# Patient Record
Sex: Female | Born: 1935 | Race: White | Hispanic: No | Marital: Married | Smoking: Former smoker
Health system: Southern US, Community
[De-identification: ages and names within clinical notes are randomized; demographics above are authoritative.]

## PROBLEM LIST (undated history)

## (undated) DIAGNOSIS — J449 Chronic obstructive pulmonary disease, unspecified: Secondary | ICD-10-CM

## (undated) DIAGNOSIS — I82409 Acute embolism and thrombosis of unspecified deep veins of unspecified lower extremity: Secondary | ICD-10-CM

## (undated) DIAGNOSIS — J939 Pneumothorax, unspecified: Secondary | ICD-10-CM

## (undated) DIAGNOSIS — I10 Essential (primary) hypertension: Secondary | ICD-10-CM

## (undated) HISTORY — PX: ABDOMINAL HYSTERECTOMY: SHX81

## (undated) HISTORY — PX: TIBIA FRACTURE SURGERY: SHX806

## (undated) HISTORY — PX: HEMIARTHROPLASTY SHOULDER FRACTURE: SUR653

## (undated) HISTORY — PX: OTHER SURGICAL HISTORY: SHX169

---

## 2005-05-28 ENCOUNTER — Ambulatory Visit: Payer: Self-pay | Admitting: Internal Medicine

## 2005-06-06 ENCOUNTER — Ambulatory Visit: Payer: Self-pay | Admitting: Internal Medicine

## 2005-07-07 ENCOUNTER — Ambulatory Visit: Payer: Self-pay | Admitting: Internal Medicine

## 2005-08-07 ENCOUNTER — Ambulatory Visit: Payer: Self-pay | Admitting: Internal Medicine

## 2005-08-14 ENCOUNTER — Ambulatory Visit: Payer: Self-pay | Admitting: Gastroenterology

## 2005-08-22 ENCOUNTER — Ambulatory Visit: Payer: Self-pay | Admitting: Gastroenterology

## 2005-08-28 ENCOUNTER — Other Ambulatory Visit: Payer: Self-pay

## 2005-09-01 ENCOUNTER — Inpatient Hospital Stay: Payer: Self-pay | Admitting: Surgery

## 2005-09-09 ENCOUNTER — Ambulatory Visit: Payer: Self-pay | Admitting: Internal Medicine

## 2005-10-15 ENCOUNTER — Ambulatory Visit: Payer: Self-pay | Admitting: Internal Medicine

## 2005-10-23 ENCOUNTER — Ambulatory Visit: Payer: Self-pay | Admitting: Surgery

## 2005-11-04 ENCOUNTER — Ambulatory Visit: Payer: Self-pay | Admitting: Internal Medicine

## 2005-12-05 ENCOUNTER — Ambulatory Visit: Payer: Self-pay | Admitting: Internal Medicine

## 2006-01-04 ENCOUNTER — Ambulatory Visit: Payer: Self-pay | Admitting: Internal Medicine

## 2006-02-04 ENCOUNTER — Ambulatory Visit: Payer: Self-pay | Admitting: Internal Medicine

## 2006-03-07 ENCOUNTER — Ambulatory Visit: Payer: Self-pay | Admitting: Internal Medicine

## 2006-04-06 ENCOUNTER — Ambulatory Visit: Payer: Self-pay | Admitting: Internal Medicine

## 2006-05-07 ENCOUNTER — Ambulatory Visit: Payer: Self-pay | Admitting: Internal Medicine

## 2006-05-21 ENCOUNTER — Ambulatory Visit: Payer: Self-pay | Admitting: Unknown Physician Specialty

## 2006-06-03 ENCOUNTER — Ambulatory Visit: Payer: Self-pay | Admitting: Unknown Physician Specialty

## 2006-06-06 ENCOUNTER — Ambulatory Visit: Payer: Self-pay | Admitting: Internal Medicine

## 2006-07-07 ENCOUNTER — Ambulatory Visit: Payer: Self-pay | Admitting: Internal Medicine

## 2006-08-20 ENCOUNTER — Ambulatory Visit: Payer: Self-pay | Admitting: Internal Medicine

## 2006-09-05 ENCOUNTER — Ambulatory Visit: Payer: Self-pay | Admitting: Internal Medicine

## 2006-10-06 ENCOUNTER — Ambulatory Visit: Payer: Self-pay | Admitting: Internal Medicine

## 2006-10-12 ENCOUNTER — Ambulatory Visit: Payer: Self-pay | Admitting: Internal Medicine

## 2006-10-13 ENCOUNTER — Ambulatory Visit: Payer: Self-pay | Admitting: Gastroenterology

## 2006-11-05 ENCOUNTER — Ambulatory Visit: Payer: Self-pay | Admitting: Internal Medicine

## 2006-12-06 ENCOUNTER — Ambulatory Visit: Payer: Self-pay | Admitting: Internal Medicine

## 2007-01-05 ENCOUNTER — Ambulatory Visit: Payer: Self-pay | Admitting: Internal Medicine

## 2007-02-05 ENCOUNTER — Ambulatory Visit: Payer: Self-pay | Admitting: Internal Medicine

## 2007-03-08 ENCOUNTER — Ambulatory Visit: Payer: Self-pay | Admitting: Internal Medicine

## 2007-04-07 ENCOUNTER — Ambulatory Visit: Payer: Self-pay | Admitting: Internal Medicine

## 2007-05-11 ENCOUNTER — Ambulatory Visit: Payer: Self-pay | Admitting: Internal Medicine

## 2007-06-07 ENCOUNTER — Ambulatory Visit: Payer: Self-pay | Admitting: Internal Medicine

## 2007-07-08 ENCOUNTER — Ambulatory Visit: Payer: Self-pay | Admitting: Internal Medicine

## 2007-09-05 ENCOUNTER — Ambulatory Visit: Payer: Self-pay | Admitting: Internal Medicine

## 2007-10-29 ENCOUNTER — Ambulatory Visit: Payer: Self-pay | Admitting: Gastroenterology

## 2007-12-06 ENCOUNTER — Ambulatory Visit: Payer: Self-pay | Admitting: Internal Medicine

## 2007-12-16 ENCOUNTER — Ambulatory Visit: Payer: Self-pay | Admitting: Internal Medicine

## 2008-01-05 ENCOUNTER — Ambulatory Visit: Payer: Self-pay | Admitting: Internal Medicine

## 2008-01-12 ENCOUNTER — Ambulatory Visit: Payer: Self-pay | Admitting: Ophthalmology

## 2008-05-07 ENCOUNTER — Ambulatory Visit: Payer: Self-pay | Admitting: Internal Medicine

## 2008-05-24 ENCOUNTER — Ambulatory Visit: Payer: Self-pay | Admitting: Internal Medicine

## 2008-06-06 ENCOUNTER — Ambulatory Visit: Payer: Self-pay | Admitting: Internal Medicine

## 2008-11-04 ENCOUNTER — Ambulatory Visit: Payer: Self-pay | Admitting: Internal Medicine

## 2008-11-09 ENCOUNTER — Ambulatory Visit: Payer: Self-pay | Admitting: Internal Medicine

## 2008-12-05 ENCOUNTER — Ambulatory Visit: Payer: Self-pay | Admitting: Internal Medicine

## 2009-04-06 ENCOUNTER — Ambulatory Visit: Payer: Self-pay | Admitting: Internal Medicine

## 2009-04-26 ENCOUNTER — Ambulatory Visit: Payer: Self-pay | Admitting: Internal Medicine

## 2009-05-07 ENCOUNTER — Ambulatory Visit: Payer: Self-pay | Admitting: Internal Medicine

## 2009-10-11 ENCOUNTER — Ambulatory Visit: Payer: Self-pay | Admitting: Internal Medicine

## 2009-11-01 ENCOUNTER — Ambulatory Visit: Payer: Self-pay | Admitting: Gastroenterology

## 2009-11-04 ENCOUNTER — Ambulatory Visit: Payer: Self-pay | Admitting: Internal Medicine

## 2009-12-09 ENCOUNTER — Inpatient Hospital Stay: Payer: Self-pay | Admitting: Unknown Physician Specialty

## 2009-12-18 ENCOUNTER — Encounter: Payer: Self-pay | Admitting: Internal Medicine

## 2010-01-04 ENCOUNTER — Encounter: Payer: Self-pay | Admitting: Internal Medicine

## 2010-02-04 ENCOUNTER — Encounter: Payer: Self-pay | Admitting: Internal Medicine

## 2010-02-06 ENCOUNTER — Ambulatory Visit: Payer: Self-pay | Admitting: Internal Medicine

## 2010-02-19 ENCOUNTER — Ambulatory Visit: Payer: Self-pay | Admitting: Internal Medicine

## 2010-02-27 ENCOUNTER — Ambulatory Visit: Payer: Self-pay

## 2010-03-05 ENCOUNTER — Ambulatory Visit: Payer: Self-pay

## 2010-03-13 ENCOUNTER — Ambulatory Visit: Payer: Self-pay

## 2010-03-15 ENCOUNTER — Ambulatory Visit: Payer: Self-pay

## 2010-03-19 ENCOUNTER — Ambulatory Visit: Payer: Self-pay | Admitting: Internal Medicine

## 2010-03-22 ENCOUNTER — Ambulatory Visit: Payer: Self-pay

## 2010-03-26 ENCOUNTER — Ambulatory Visit: Payer: Self-pay

## 2010-03-29 ENCOUNTER — Ambulatory Visit: Payer: Self-pay

## 2010-04-02 ENCOUNTER — Ambulatory Visit: Payer: Self-pay

## 2010-04-05 ENCOUNTER — Ambulatory Visit: Payer: Self-pay

## 2010-04-09 ENCOUNTER — Ambulatory Visit: Payer: Self-pay

## 2010-04-12 ENCOUNTER — Ambulatory Visit: Payer: Self-pay

## 2010-04-15 ENCOUNTER — Ambulatory Visit: Payer: Self-pay

## 2010-04-19 ENCOUNTER — Ambulatory Visit: Payer: Self-pay

## 2010-04-23 ENCOUNTER — Ambulatory Visit: Payer: Self-pay

## 2010-04-25 ENCOUNTER — Ambulatory Visit: Payer: Self-pay | Admitting: Internal Medicine

## 2010-04-26 ENCOUNTER — Ambulatory Visit: Payer: Self-pay

## 2010-04-27 LAB — CEA: CEA: 1.2 ng/mL (ref 0.0–4.7)

## 2010-04-30 ENCOUNTER — Ambulatory Visit: Payer: Self-pay

## 2010-05-03 ENCOUNTER — Ambulatory Visit: Payer: Self-pay

## 2010-05-07 ENCOUNTER — Ambulatory Visit: Payer: Self-pay | Admitting: Internal Medicine

## 2010-05-07 ENCOUNTER — Ambulatory Visit: Payer: Self-pay

## 2010-05-28 ENCOUNTER — Ambulatory Visit: Payer: Self-pay

## 2010-06-06 ENCOUNTER — Ambulatory Visit: Payer: Self-pay

## 2012-01-13 ENCOUNTER — Ambulatory Visit: Payer: Self-pay | Admitting: Gastroenterology

## 2012-03-26 ENCOUNTER — Ambulatory Visit: Payer: Self-pay | Admitting: Unknown Physician Specialty

## 2012-04-09 ENCOUNTER — Emergency Department: Payer: Self-pay | Admitting: Unknown Physician Specialty

## 2012-04-09 LAB — CBC
HCT: 29 % — ABNORMAL LOW (ref 35.0–47.0)
MCH: 33.1 pg (ref 26.0–34.0)
MCHC: 36.2 g/dL — ABNORMAL HIGH (ref 32.0–36.0)
Platelet: 300 10*3/uL (ref 150–440)
RDW: 12.4 % (ref 11.5–14.5)
WBC: 10.8 10*3/uL (ref 3.6–11.0)

## 2012-04-09 LAB — BASIC METABOLIC PANEL
Co2: 28 mmol/L (ref 21–32)
Creatinine: 1.24 mg/dL (ref 0.60–1.30)
EGFR (Non-African Amer.): 41 — ABNORMAL LOW
Glucose: 155 mg/dL — ABNORMAL HIGH (ref 65–99)
Osmolality: 280 (ref 275–301)
Potassium: 3.8 mmol/L (ref 3.5–5.1)
Sodium: 137 mmol/L (ref 136–145)

## 2012-04-09 LAB — PROTIME-INR
INR: 1.2
Prothrombin Time: 15.2 secs — ABNORMAL HIGH (ref 11.5–14.7)

## 2012-04-11 ENCOUNTER — Ambulatory Visit: Payer: Self-pay | Admitting: Internal Medicine

## 2012-04-11 ENCOUNTER — Observation Stay: Payer: Self-pay | Admitting: Internal Medicine

## 2012-04-11 LAB — CBC
HCT: 29.7 % — ABNORMAL LOW (ref 35.0–47.0)
HGB: 10.1 g/dL — ABNORMAL LOW (ref 12.0–16.0)
MCH: 31 pg (ref 26.0–34.0)
MCHC: 34.1 g/dL (ref 32.0–36.0)
MCV: 91 fL (ref 80–100)
Platelet: 292 10*3/uL (ref 150–440)
RBC: 3.27 10*6/uL — ABNORMAL LOW (ref 3.80–5.20)
WBC: 10.9 10*3/uL (ref 3.6–11.0)

## 2012-04-11 LAB — COMPREHENSIVE METABOLIC PANEL
Albumin: 2.9 g/dL — ABNORMAL LOW (ref 3.4–5.0)
Alkaline Phosphatase: 82 U/L (ref 50–136)
BUN: 21 mg/dL — ABNORMAL HIGH (ref 7–18)
Bilirubin,Total: 1.1 mg/dL — ABNORMAL HIGH (ref 0.2–1.0)
Chloride: 99 mmol/L (ref 98–107)
Creatinine: 1.33 mg/dL — ABNORMAL HIGH (ref 0.60–1.30)
Glucose: 122 mg/dL — ABNORMAL HIGH (ref 65–99)
SGOT(AST): 25 U/L (ref 15–37)
SGPT (ALT): 12 U/L (ref 12–78)
Sodium: 136 mmol/L (ref 136–145)
Total Protein: 7.2 g/dL (ref 6.4–8.2)

## 2012-04-11 LAB — APTT: Activated PTT: 33.7 secs (ref 23.6–35.9)

## 2012-04-11 LAB — CK TOTAL AND CKMB (NOT AT ARMC): CK-MB: 0.9 ng/mL (ref 0.5–3.6)

## 2012-04-11 LAB — PRO B NATRIURETIC PEPTIDE: B-Type Natriuretic Peptide: 100 pg/mL (ref 0–450)

## 2012-04-11 LAB — PROTIME-INR: Prothrombin Time: 15.5 secs — ABNORMAL HIGH (ref 11.5–14.7)

## 2012-04-11 LAB — TROPONIN I
Troponin-I: <0.02 ng/mL
Troponin-I: <0.02 ng/mL

## 2012-04-12 ENCOUNTER — Encounter: Payer: Self-pay | Admitting: Internal Medicine

## 2012-04-12 LAB — BASIC METABOLIC PANEL
Anion Gap: 12 (ref 7–16)
Calcium, Total: 8 mg/dL — ABNORMAL LOW (ref 8.5–10.1)
Co2: 24 mmol/L (ref 21–32)
Creatinine: 1.37 mg/dL — ABNORMAL HIGH (ref 0.60–1.30)
EGFR (African American): 42 — ABNORMAL LOW
Osmolality: 276 (ref 275–301)
Potassium: 4.4 mmol/L (ref 3.5–5.1)
Sodium: 135 mmol/L — ABNORMAL LOW (ref 136–145)

## 2012-04-12 LAB — CBC WITH DIFFERENTIAL/PLATELET
Basophil #: 0.1 10*3/uL (ref 0.0–0.1)
Basophil %: 0.6 %
Eosinophil #: 0.5 10*3/uL (ref 0.0–0.7)
HCT: 27 % — ABNORMAL LOW (ref 35.0–47.0)
HGB: 9.5 g/dL — ABNORMAL LOW (ref 12.0–16.0)
Lymphocyte %: 14.9 %
MCHC: 35 g/dL (ref 32.0–36.0)
MCV: 91 fL (ref 80–100)
Monocyte #: 1 x10 3/mm — ABNORMAL HIGH (ref 0.2–0.9)
Monocyte %: 10.4 %
Neutrophil #: 6.5 10*3/uL (ref 1.4–6.5)
Neutrophil %: 69 %
RBC: 2.97 10*6/uL — ABNORMAL LOW (ref 3.80–5.20)
WBC: 9.4 10*3/uL (ref 3.6–11.0)

## 2012-04-12 LAB — TROPONIN I: Troponin-I: <0.02 ng/mL

## 2012-04-14 LAB — PROTIME-INR: Prothrombin Time: 18.5 secs — ABNORMAL HIGH (ref 11.5–14.7)

## 2012-04-19 LAB — PROTIME-INR
INR: 1.5
Prothrombin Time: 18.7 secs — ABNORMAL HIGH (ref 11.5–14.7)

## 2012-04-22 LAB — PROTIME-INR
INR: 1.9
Prothrombin Time: 21.7 secs — ABNORMAL HIGH (ref 11.5–14.7)

## 2012-04-26 LAB — PROTIME-INR: INR: 2.1

## 2012-04-29 LAB — PROTIME-INR: INR: 2.2

## 2012-05-04 LAB — PROTIME-INR: INR: 2.4

## 2012-05-07 ENCOUNTER — Encounter: Payer: Self-pay | Admitting: Internal Medicine

## 2013-11-04 DIAGNOSIS — I10 Essential (primary) hypertension: Secondary | ICD-10-CM | POA: Insufficient documentation

## 2014-08-16 DIAGNOSIS — E782 Mixed hyperlipidemia: Secondary | ICD-10-CM | POA: Insufficient documentation

## 2014-10-24 NOTE — H&P (Signed)
PATIENT NAME:  [name], [name] MR#:  [id number] DATE OF BIRTH:  [birth year]  DATE OF ADMISSION:  04/11/2012  PRIMARY CARE PHYSICIAN: Dr. Beckey DowningWillett, [facility].   HISTORY OF PRESENT ILLNESS: This is an 81 year old female who has been having a few falls recently, [place]17 [place] and then had a shoulder fracture and last Tuesday had a left shoulder replacement. The other day she had a fall while she was walking to the bathroom where she tried to walk by herself and got a hematoma on the forehead. The patient has a history of deep vein thrombosis and PE in the past and has been on chronic anticoagulation but has been on hold recently with the surgery and the falls. The patient has had some left lower extremity pain, had some x-ray that  were unremarkable. She had an ultrasound of the left lower extremity that was positive for deep vein thrombosis. Hospitalist services were contacted for evaluation because she was hypoxic. Pulse oximetry of 89% on room air. The patient does complain of some chest pain when she coughs and some shortness of breath going on for a few weeks. In the Emergency Room her INR was subtherapeutic at 1.2.   PAST MEDICAL HISTORY:  1. Deep vein thrombosis and PE.  2. Cerebrovascular accident. 3. Hyperlipidemia. 4. Low vitamin D. 5. History of colon cancer, status post surgery and chemotherapy. 6. Chronic kidney disease.   PAST SURGICAL HISTORY:  1. Left shoulder replacement.  2. Left knee surgery. 3. Colon resection. 4. Hysterectomy.   ALLERGIES: Aspirin and morphine.   MEDICATIONS:  1. Coumadin 4 mg on Monday and Friday, 2.5 mg Tuesday, Wednesday, Thursday, Saturday Sunday. 2. Lipitor 5 mg at bedtime.  3. Vitamin D 2000 international units daily.  4. Colace 100 mg twice a day. 5. Oxycodone 5 mg 1 to 2 tablets every 4 to 6 hours as needed for pain.    SOCIAL HISTORY: [facility].  Quit smoking years ago. No drug use. No alcohol. 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   FAMILY HISTORY: Father died at 6565, most likely heart related. Mother died at 4295 of Alzheimer's.   REVIEW OF SYSTEMS: CONSTITUTIONAL: Positive for sweats. No fever. No chills. Positive for weakness. EYES: Does wear glasses. EARS, NOSE, MOUTH, AND THROAT: Positive for hoarse voice. No sore throat. No difficulty swallowing. CARDIOVASCULAR: Some chest pain while coughing. RESPIRATORY: Positive for shortness of breath. Positive for cough. No sputum. No hemoptysis. GASTROINTESTINAL: No nausea. No vomiting. No abdominal pain. No diarrhea. No constipation. No bright red blood per rectum. No melena. GENITOURINARY: No burning on urination. No hematuria. MUSCULOSKELETAL: No joint pain or muscle pain. INTEGUMENT: No rashes or eruptions. NEUROLOGIC: No fainting or blackouts. PSYCHIATRIC: No anxiety or depression. ENDOCRINE: No thyroid problems. HEMATOLOGIC/LYMPHATIC: No anemia. No easy bruising or bleeding.   PHYSICAL EXAMINATION:  VITAL SIGNS: Vital signs on presentation included a pulse oximetry of 87% on room air, blood pressure 119/64, pulse 95, respirations 22, temperature 98.3. Most recent vital signs: Blood pressure 134/60, pulse oximetry 96% on oxygen.   GENERAL: No respiratory distress, lying flat in bed.   EYES: Conjunctivae and lids normal. Pupils equal, round, and reactive to light. Extraocular muscles intact. No nystagmus.   EARS, NOSE, MOUTH, AND THROAT: Tympanic membranes no erythema. Nasal mucosa no erythema. Throat no erythema. No exudate seen. Lips and gums  no lesions.   NECK: No JVD. No bruits. No lymphadenopathy. No thyromegaly. No thyroid nodules palpated.   RESPIRATORY: Lungs clear to auscultation. No use of accessory muscles to breathe. No rhonchi, rales, or wheeze heard.   CARDIOVASCULAR: S1, S2 normal. No gallops, rubs, or murmurs heard. Carotid upstroke 2+  bilaterally. No bruits. Dorsalis pedis pulses 2+ bilaterally. Positive for edema of the lower extremity, left greater than right; 3+ on the left and 2+ on the right.   ABDOMEN: Soft, nontender. No organomegaly/splenomegaly. Normoactive bowel sounds. No masses felt.   LYMPHATIC: No lymph nodes in the neck.   MUSCULOSKELETAL: 3+ edema on the left, 2+ on the right. No clubbing. No cyanosis. Left arm bruising surrounding, in a sling.   SKIN: Chronic lower extremity discoloration bilateral lower extremities.   NEUROLOGICAL: Cranial nerves II through XII grossly intact. Deep tendon reflexes 2+ bilateral lower extremities.   PSYCHIATRIC: Patient is oriented to person, place, and time.   LABORATORY, DIAGNOSTIC AND RADIOLOGICAL DATA: INR 1.2. Ultrasound of the lower extremity showed occlusive left lower extremity deep vein thrombosis with small areas of near occlusive thrombus. BNP 100, white blood cell count 10.9, hemoglobin and hematocrit 10.1 and 29.7, platelet count 292, glucose 122, BUN 21, creatinine 1.33, sodium 136, potassium 4.1, chloride 99, CO2 28, calcium 8.1, total bilirubin 1.1. Other liver function tests normal. Albumin 2.9, GFR 38, troponin negative. Chest x-ray: No acute disease in the chest. Left knee AP and lateral old tibial plateau fracture with previous hardware. No acute fracture tibia and fibula. No acute fracture or dislocation. Femur on the left negative. CT scan of the head on 10/04 no acute abnormality. EKG shows normal sinus rhythm, 98 beats per minute, left atrial enlargement, left ventricular hypertrophy.   ASSESSMENT AND PLAN:  1. Acute respiratory failure with hypoxia, suspected pulmonary embolism. Will give oxygen supplementation.  2. Deep vein thrombosis with suspected pulmonary embolism. The patient is on chronic anticoagulation but this has been on hold with the falls and surgery. Recently restarted the Coumadin but level is subtherapeutic. Will have to give Lovenox 1  mg/kg sub-Q q.12 hours at this point. Increase Coumadin dose to 4 mg nightly. Will admit as an observation and most likely will be sending back to rehab with Lovenox and increased dose of Coumadin with close clinical follow up. Will get a V/Q scan in the a.m. Will watch for hemodynamic instability overnight. If stable, hopefully will be able to send home tomorrow.  3. History of cerebrovascular accident. On anticoagulation, but subtherapeutic at this point.  4. Hyperlipidemia. On Lipitor.  5. Low vitamin D, on vitamin D replacement.  6. Chronic kidney disease. Will give gentle fluids overnight, check a BMP in the a.m. to see if creatinine improves. Unable to get a CT scan of the chest at this point with the chronic kidney disease.   TIME SPENT ON ADMISSION: 55 minutes.   CODE STATUS: Patient is a FULL CODE.   ____________________________ Herschell Dimes. Renae Gloss, MD rjw:cms D: 04/11/2012 13:00:48 ET T: 04/11/2012 13:13:24 ET JOB#: [id number]  cc: Herschell Dimes. Renae Gloss, MD, <Dictator> Jorje Guild. Beckey Downing, MD Salley Scarlet MD ELECTRONICALLY SIGNED 04/14/2012 16:58

## 2014-10-24 NOTE — Discharge Summary (Signed)
PATIENT NAME:  [name] CoombsSMITH, Arwen J MR#:  [id number] DATE OF BIRTH:  [birth year]  DATE OF ADMISSION:  04/11/2012 DATE OF DISCHARGE:  04/12/2012   PRIMARY CARE PHYSICIAN: Barry BrunnerGlenn Willett, MD   FINAL DIAGNOSES:  1. Acute respiratory failure.  2. Deep vein thrombosis, suspected pulmonary embolus.  3. History of cerebrovascular accident.  4. Hyperlipidemia.  5. Chronic kidney disease.  6. Low Vitamin D.  7. Anemia.    FINAL MEDICATIONS UPON DISCHARGE:  1. Colace 100 mg twice a day as needed for constipation.  2. Lipitor 10 mg half a tablet at bedtime.  3. Vitamin D3 2000 IU daily.  4. Oxycodone 5 mg 1 to 2 tabs every 4 to 6 hours as needed for pain. 5. Warfarin 4 mg daily at 5 p.m.  6. Enoxaparin 105 mg subcutaneous injection every 12 hours. I do recommend a PT/INR on a daily basis and discontinuing enoxaparin once INR above 2.   OXYGEN: 1 liter nasal cannula p.r.n.   DIET: Low sodium diet, regular consistency.   ACTIVITY: Continue physical therapy at rehab.  FOLLOW-UP: Follow-up in 1 to 2 days with doctor at rehab.   REASON FOR ADMISSION: The patient was admitted as an observation on 04/11/2012 and discharged 04/12/2012. She came in with pain in the leg and was found to have a DVT of the left lower extremity and her oxygen saturation was 89% on room air so there was a suspicion for pulmonary embolism. Since the patient's creatinine was borderline, unable to do a CT scan of the chest. Hospitalist services were contacted for further evaluation. The patient was admitted as an observation and given oxygen therapy. Lovenox 1 mg/kg sub-Q q.12 hours until her Coumadin level was therapeutic and her Coumadin was increased to 4 mg nightly.   LABORATORY AND RADIOLOGICAL DATA DURING THE HOSPITAL COURSE: Left femur x-ray showed no osseous injury. Tibia and fibula on the left no acute fracture or dislocation. Knee on the left negative.   Chest x-ray no acute disease in the chest.   PTT 33.7. Troponin  negative. Glucose 122, BUN 21, creatinine 1.33, sodium 136, potassium 4.1, chloride 99, CO2 28, calcium 8.1. Liver function tests normal range. Albumin low at 2.9. GFR 38. White blood cell count 10.9, hemoglobin and hematocrit 10.1 and 29.7, platelet count 292. BNP 100.  Ultrasound of the lower extremity on the left showed occlusive left lower extremity DVT. Small areas of near occlusive thrombosis.   Troponins next two sets were negative. Creatinine after IV fluids 1.37.   Lung V/Q scan low probability for pulmonary embolism.   HOSPITAL COURSE PER PROBLEM LIST:  1. For the patient's acute respiratory failure, pulse oximetry was 89% on room air. The patient was given oxygen supplementation during the hospital stay and can continue at rehab. It was only borderline oxygen saturation, may be worsened with the patient's recent shoulder replacement.  2. DVT and suspected PE. Pulmonary embolism could not be confirmed with a CT scan. A V/Q scan showed low probability. The patient is on anticoagulation for DVT with enoxaparin subcutaneous injection twice a day. Her Coumadin level was increased. The patient was off Coumadin for a recent shoulder replacement and fall. She fell and hit her head two days ago. Her INR is subtherapeutic. She will need to be on enoxaparin until her INR is therapeutic. Close clinical following  at rehab. I stressed to the patient that she must ask for assistance to get around. Benefits and risks of anticoagulation discussed with the patient.  The patient has been on chronic anticoagulation since the 60's.  3. History of CVA. She is on Coumadin.  4. Hyperlipidemia. On Lipitor.  5. Chronic kidney disease, stage III, no improvement with IV fluid hydration.  6. Low Vitamin D. She is on supplementation.  7. Anemia. Will need to follow-up counts as outpatient.  TIME SPENT ON DISCHARGE: 35 minutes.   ____________________________ Herschell Dimes. Renae Gloss, MD rjw:drc D: 04/12/2012 13:23:36  ET T: 04/12/2012 13:30:42 ET JOB#: [id number]  cc: Herschell Dimes. Renae Gloss, MD, <Dictator> Jorje Guild. Beckey Downing, MD [facility] Salley Scarlet MD ELECTRONICALLY SIGNED 04/14/2012 16:59

## 2016-06-13 ENCOUNTER — Encounter: Payer: Medicare Other | Attending: Surgery | Admitting: Surgery

## 2016-06-13 DIAGNOSIS — E785 Hyperlipidemia, unspecified: Secondary | ICD-10-CM | POA: Insufficient documentation

## 2016-06-13 DIAGNOSIS — Z87891 Personal history of nicotine dependence: Secondary | ICD-10-CM | POA: Insufficient documentation

## 2016-06-13 DIAGNOSIS — I80203 Phlebitis and thrombophlebitis of unspecified deep vessels of lower extremities, bilateral: Secondary | ICD-10-CM | POA: Insufficient documentation

## 2016-06-13 DIAGNOSIS — I890 Lymphedema, not elsewhere classified: Secondary | ICD-10-CM | POA: Insufficient documentation

## 2016-06-13 DIAGNOSIS — L97222 Non-pressure chronic ulcer of left calf with fat layer exposed: Secondary | ICD-10-CM | POA: Insufficient documentation

## 2016-06-13 DIAGNOSIS — Z7901 Long term (current) use of anticoagulants: Secondary | ICD-10-CM | POA: Insufficient documentation

## 2016-06-13 DIAGNOSIS — Z86718 Personal history of other venous thrombosis and embolism: Secondary | ICD-10-CM | POA: Insufficient documentation

## 2016-06-14 NOTE — Progress Notes (Addendum)
[name], [name] ([id number]) Visit Report for 06/13/2016 Chief Complaint Document Details Patient Name: [name], [name] 06/13/2016 10:00 Date of Service: AM Medical Record [id number] Number: Patient Account Number: [id number] 1930/06/19 (82 y.o. Treating RN: [name] Date of Birth/Sex: Female) Other Clinician: Primary Care Physician: [name] Treating [name] Referring Physician: Etheleen Gordon Physician/Extender: [name] in Treatment: 0 Information Obtained from: Patient Chief Complaint Patient presents to the wound care center for a consult due non healing wound to the left lower extremity which she's had for about a month but she's had bilateral lower extremity swelling for about 15 years Electronic Signature(s) Signed: 06/13/2016 11:36:04 AM By: [name] Kanner MD, FACS Entered By: [name] on 06/13/2016 11:36:03 [name], [name] ([id number]) -------------------------------------------------------------------------------- HPI Details Patient Name: [name]. 06/13/2016 10:00 Date of Service: AM Medical Record [id number] Number: Patient Account Number: [id number] 1930/02/10 (82 y.o. Treating RN: [name] Date of Birth/Sex: Female) Other Clinician: Primary Care Physician: [name] Treating [name] Referring Physician: Etheleen Gordon Physician/Extender: [name] in Treatment: 0 History of Present Illness Location: ulcerated area on the lateral part of her left calf Quality: Patient reports experiencing a dull pain to affected area(s). Severity: Patient states wound are getting worse. Duration: Patient has had the wound for < 4 weeks prior to presenting for treatment Timing: Pain in wound is Intermittent (comes and goes Context: The wound appeared gradually over time Modifying Factors: Other treatment(s) tried include:she has been wearing what sounds like lymphedema pumps for the last 12 years or so Associated Signs and Symptoms: Patient reports  having increase swelling. HPI Description: 82 year old patient who comes with bilateral lower extremity edema which she's had for at least 15 years and now an ulcerated area on the left lower extremity which she's had for a month. He was recently seen by the PCP who made a diagnosis of venous stasis ulcer of the left calf and referred her to the wound center. He was placed on clindamycin. Past medical history significant for colon cancer, history of DVT, left tibial fracture in 2012 which required surgery, hyperlipidemia and has been on warfarin for several years. She also gives a history of having lymph pumps and she has been wearing them for about 12 years. She is a former smoker and has quit about 10 years ago. Electronic Signature(s) Signed: 06/13/2016 11:45:40 AM By: [name] Kanner MD, FACS Entered By: [name] on 06/13/2016 11:45:40 [name], [name] ([id number]) -------------------------------------------------------------------------------- Physical Exam Details Patient Name: [name], [name] 06/13/2016 10:00 Date of Service: AM Medical Record [id number] Number: Patient Account Number: [id number] August 26, 1929 (82 y.o. Treating RN: [name] Date of Birth/Sex: Female) Other Clinician: Primary Care Physician: [name] Treating [name] Referring Physician: Etheleen Gordon Physician/Extender: Weeks in Treatment: 0 Constitutional . Pulse regular. Respirations normal and unlabored. Afebrile. . Eyes Nonicteric. Reactive to light. Ears, Nose, Mouth, and Throat Lips, teeth, and gums WNL.Marland Kitchen Moist mucosa without lesions. Neck supple and nontender. No palpable supraclavicular or cervical adenopathy. Normal sized without goiter. Respiratory WNL. No retractions.. Cardiovascular Pedal Pulses WNL. ABI on the left is 0.89 on the right is 0.86. she has bilateral stage II lymphedema and the left is worse than the right. Gastrointestinal (GI) Abdomen without masses or tenderness..  No liver or spleen enlargement or tenderness.. Lymphatic No adneopathy. No adenopathy. No adenopathy. Musculoskeletal Adexa without tenderness or enlargement.. Digits and nails w/o clubbing, cyanosis, infection, petechiae, ischemia, or inflammatory conditions.. Integumentary (Hair, Skin) No suspicious lesions. No crepitus or fluctuance. No peri-wound warmth  or erythema. No masses.Marland Kitchen Psychiatric Judgement and insight Intact.. No evidence of depression, anxiety, or agitation.. Notes the patient has long-standing lymphedema both lower extremities and I do not believe this has been worked up. She has a small ulcerated area on the left lateral calf and more recently has had a laceration superficially due to trauma. There is no evidence of active cellulitis. No sharp debridement was required today. Electronic Signature(s) Signed: 06/13/2016 11:46:53 AM By: [name] Kanner MD, FACS Entered By: [name] on 06/13/2016 11:46:52 [name], [name] Kitchen ([id number]) [name], [name] Kitchen ([id number]) -------------------------------------------------------------------------------- Physician Orders Details Patient Name: [name] 06/13/2016 10:00 Date of Service: AM Medical Record [id number] Number: Patient Account Number: [id number] 29-Jul-1929 (82 y.o. Treating RN: [name] Date of Birth/Sex: Female) Other Clinician: Primary Care Physician: [name] Treating [name] Referring Physician: Etheleen Gordon Physician/Extender: [name] in Treatment: 0 Verbal / Phone Orders: Yes Clinician: Huel Gordon Read Back and Verified: Yes Diagnosis Coding Wound Cleansing Wound #1 Left,Proximal,Lateral Lower Leg o Clean wound with Normal Saline. Wound #2 Left,Distal,Lateral Lower Leg o Clean wound with Normal Saline. Anesthetic o Topical Lidocaine 4% cream applied to wound bed prior to debridement Primary Wound Dressing Wound #1 Left,Proximal,Lateral Lower Leg o Aquacel Ag Wound #2  Left,Distal,Lateral Lower Leg o Aquacel Ag Secondary Dressing Wound #1 Left,Proximal,Lateral Lower Leg o ABD pad Wound #2 Left,Distal,Lateral Lower Leg o ABD pad Dressing Change Frequency Wound #1 Left,Proximal,Lateral Lower Leg o Change dressing every week o Other: - Monday for dressing change Wound #2 Left,Distal,Lateral Lower Leg o Change dressing every week o Other: - Monday for dressing change Follow-up Appointments Wound #1 Left,Proximal,Lateral Lower Leg [place], [name] J. ([id number]) o Return Appointment in 1 week. o Nurse Visit as needed - Monday for dressing change Wound #2 Left,Distal,Lateral Lower Leg o Return Appointment in 1 week. o Nurse Visit as needed - Monday for dressing change Edema Control Wound #1 Left,Proximal,Lateral Lower Leg o 3 Layer Compression System - Left Lower Extremity Wound #2 Left,Distal,Lateral Lower Leg o 3 Layer Compression System - Left Lower Extremity Additional Orders / Instructions Wound #1 Left,Proximal,Lateral Lower Leg o Increase protein intake. o Activity as tolerated Wound #2 Left,Distal,Lateral Lower Leg o Increase protein intake. o Activity as tolerated Consults o Vascular - Venous Reflux Studies (bilateral) Electronic Signature(s) Signed: 06/13/2016 3:45:07 PM By: [name] Kanner MD, FACS Signed: 06/13/2016 3:55:01 PM By: Elliot Gurney, RN, BSN, Kim RN, BSN Entered By: Elliot Gurney, RN, BSN, [name] on 06/13/2016 11:04:03 [place]oll, [name] ([id number]) -------------------------------------------------------------------------------- Problem List Details Patient Name: ELIDE, STALZER 06/13/2016 10:00 Date of Service: AM Medical Record [id number] Number: Patient Account Number: [id number] [birth year] (82 y.o. Treating RN: [name] Date of Birth/Sex: Female) Other Clinician: Primary Care Physician: [name] Treating [name] Referring Physician: Etheleen Gordon Physician/Extender: [name] in Treatment:  0 Active Problems ICD-10 Encounter Code Description Active Date Diagnosis I89.0 Lymphedema, not elsewhere classified 06/13/2016 Yes L97.222 Non-pressure chronic ulcer of left calf with fat layer 06/13/2016 Yes exposed I80.203 Phlebitis and thrombophlebitis of unspecified deep 06/13/2016 Yes vessels of lower extremities, bilateral Inactive Problems Resolved Problems Electronic Signature(s) Signed: 06/16/2016 3:42:50 PM By: [name] Kanner MD, FACS Signed: 06/16/2016 6:04:04 PM By: Elliot Gurney RN, BSN, Kim RN, BSN Previous Signature: 06/13/2016 11:35:39 AM Version By: [name] Kanner MD, FACS Entered By: Elliot Gurney RN, BSN, [name] on 06/16/2016 15:30:57 [place], [name] ([id number]) -------------------------------------------------------------------------------- Progress Note Details Patient Name: [name], [name] 06/13/2016 10:00 Date of Service: AM Medical Record [id number] Number: Patient Account Number: [id number] [birth year] (82 y.o.  Treating RN: [name] CoventryWoody, [name] Date of Birth/Sex: Female) Other Clinician: Primary Care Physician: [name] NicksMACDONALD, KERI Treating Matilde Pottenger Referring Physician: Etheleen NicksMACDONALD, KERI Physician/Extender: [name] AdeWeeks in Treatment: 0 Subjective Chief Complaint Information obtained from Patient Patient presents to the wound care center for a consult due non healing wound to the left lower extremity which she's had for about a month but she's had bilateral lower extremity swelling for about 15 years History of Present Illness (HPI) The following HPI elements were documented for the patient's wound: Location: ulcerated area on the lateral part of her left calf Quality: Patient reports experiencing a dull pain to affected area(s). Severity: Patient states wound are getting worse. Duration: Patient has had the wound for < 4 weeks prior to presenting for treatment Timing: Pain in wound is Intermittent (comes and goes Context: The wound appeared gradually over time Modifying Factors: Other  treatment(s) tried include:she has been wearing what sounds like lymphedema pumps for the last 12 years or so Associated Signs and Symptoms: Patient reports having increase swelling. 82 year old patient who comes with bilateral lower extremity edema which she's had for at least 15 years and now an ulcerated area on the left lower extremity which she's had for a month. He was recently seen by the PCP who made a diagnosis of venous stasis ulcer of the left calf and referred her to the wound center. He was placed on clindamycin. Past medical history significant for colon cancer, history of DVT, left tibial fracture in 2012 which required surgery, hyperlipidemia and has been on warfarin for several years. She also gives a history of having lymph pumps and she has been wearing them for about 12 years. She is a former smoker and has quit about 10 years ago. Wound History Patient presents with 2 open wounds that have been present for approximately 1 month. Patient has been treating wounds in the following manner: open to air. Laboratory tests have not been performed in the last month. Patient reportedly has not tested positive for an antibiotic resistant organism. Patient reportedly has not tested positive for osteomyelitis. Patient reportedly has not had testing performed to evaluate circulation in the legs. Patient experiences the following problems associated with their wounds: infection, swelling. Patient History Information obtained from Patient. [name], [place] Shela CommonsJ. ([id number]) Allergies aspirin Family History Cancer - Child, Diabetes - Child, Maternal Grandparents, Heart Disease - Maternal Grandparents, Kidney Disease - Maternal Grandparents, No family history of Hypertension, Lung Disease, Seizures, Stroke, Thyroid Problems, Tuberculosis. Social History Former smoker - 20 + years ago, Marital Status - Married, Alcohol Use - Never, Drug Use - No History, Caffeine Use - Never. Medical  History Eyes Denies history of Cataracts, Glaucoma, Optic Neuritis Ear/Nose/Mouth/Throat Denies history of Chronic sinus problems/congestion, Middle ear problems Hematologic/Lymphatic Patient has history of Lymphedema Denies history of Anemia, Hemophilia, Human Immunodeficiency Virus, Sickle Cell Disease Respiratory Patient has history of Asthma - controlled Denies history of Aspiration, Chronic Obstructive Pulmonary Disease (COPD), Pneumothorax, Sleep Apnea, Tuberculosis Cardiovascular Patient has history of Deep Vein Thrombosis - lower extremties bilaterally Denies history of Angina, Arrhythmia, Congestive Heart Failure, Coronary Artery Disease, Hypertension, Hypotension, Myocardial Infarction, Peripheral Arterial Disease, Peripheral Venous Disease, Phlebitis, Vasculitis Gastrointestinal Denies history of Cirrhosis , Colitis, Crohn s, Hepatitis A, Hepatitis B Endocrine Denies history of Type I Diabetes, Type II Diabetes Genitourinary Denies history of End Stage Renal Disease Immunological Denies history of Lupus Erythematosus, Raynaud s, Scleroderma Integumentary (Skin) Denies history of History of Burn, History of pressure wounds Musculoskeletal Denies history of Gout,  Rheumatoid Arthritis, Osteoarthritis, Osteomyelitis Neurologic Denies history of Dementia, Neuropathy, Quadriplegia, Paraplegia, Seizure Disorder Oncologic Patient has history of Received Chemotherapy - 2007, Received Radiation - 2007 Psychiatric Denies history of Anorexia/bulimia, Confinement Anxiety Hospitalization/Surgery History - 07/07/2005, [facility], Colon cancer. [place], Dameisha J. ([id number]) Review of Systems (ROS) Constitutional Symptoms (General Health) The patient has no complaints or symptoms. Eyes The patient has no complaints or symptoms. Ear/Nose/Mouth/Throat The patient has no complaints or symptoms. Hematologic/Lymphatic The patient has no complaints or symptoms. Respiratory Complains or has  symptoms of Shortness of Breath. Denies complaints or symptoms of Chronic or frequent coughs. Cardiovascular Complains or has symptoms of Chest pain - once in a while, LE edema - bilaterally. Gastrointestinal The patient has no complaints or symptoms. Endocrine The patient has no complaints or symptoms. Genitourinary Complains or has symptoms of Incontinence/dribbling - small amount of dribbling. Denies complaints or symptoms of Kidney failure/ Dialysis. Immunological The patient has no complaints or symptoms. Integumentary (Skin) Complains or has symptoms of Wounds, Bleeding or bruising tendency. Denies complaints or symptoms of Breakdown, Swelling. Musculoskeletal Denies complaints or symptoms of Muscle Pain, Muscle Weakness. Neurologic The patient has no complaints or symptoms. Oncologic Colon cancer 2007 Psychiatric The patient has no complaints or symptoms. Medications: I have reviewed her list of medications which include acetaminophen, Norvasc, Cleocin, Lasix, Antivert, Coumadin. [place], [name] J. ([id number]) Constitutional Pulse regular. Respirations normal and unlabored. Afebrile. Eyes Nonicteric. Reactive to light. Ears, Nose, Mouth, and Throat Lips, teeth, and gums WNL.Marland Kitchen Moist mucosa without lesions. Neck supple and nontender. No palpable supraclavicular or cervical adenopathy. Normal sized without goiter. Respiratory WNL. No retractions.. Cardiovascular Pedal Pulses WNL. ABI on the left is 0.89 on the right is 0.86. she has bilateral stage II lymphedema and the left is worse than the right. Gastrointestinal (GI) Abdomen without masses or tenderness.. No liver or spleen enlargement or tenderness.. Lymphatic No adneopathy. No adenopathy. No adenopathy. Musculoskeletal Adexa without tenderness or enlargement.. Digits and nails w/o clubbing, cyanosis, infection, petechiae, ischemia, or inflammatory conditions.Marland Kitchen Psychiatric Judgement and insight  Intact.. No evidence of depression, anxiety, or agitation.. General Notes: the patient has long-standing lymphedema both lower extremities and I do not believe this has been worked up. She has a small ulcerated area on the left lateral calf and more recently has had a laceration superficially due to trauma. There is no evidence of active cellulitis. No sharp debridement was required today. Integumentary (Hair, Skin) No suspicious lesions. No crepitus or fluctuance. No peri-wound warmth or erythema. No masses.. Wound #1 status is Open. Original cause of wound was Gradually Appeared. The wound is located on the Left,Proximal,Lateral Lower Leg. The wound measures 0.4cm length x 3cm width x 0.1cm depth; 0.942cm^2 area and 0.094cm^3 volume. The wound is limited to skin breakdown. There is a small amount of serosanguineous drainage noted. The wound margin is flat and intact. There is medium (34-66%) red granulation within the wound bed. There is a medium (34-66%) amount of necrotic tissue within the wound bed including Eschar. The periwound skin appearance exhibited: Excoriation, Moist, Ecchymosis. The periwound skin appearance did not exhibit: Callus, Crepitus, Fluctuance, Friable, Induration, Localized Edema, Rash, Scarring, Dry/Scaly, Maceration, Atrophie Blanche, Cyanosis, Hemosiderin Staining, Mottled, Pallor, Rubor, Erythema. [place], [name] ([id number]) General Notes: skin tear Wound #2 status is Open. Original cause of wound was Gradually Appeared. The wound is located on the Left,Distal,Lateral Lower Leg. The wound measures 1.5cm length x 1.8cm width x 0.2cm depth; 2.121cm^2 area and 0.424cm^3 volume. There is fat  exposed. There is no tunneling or undermining noted. There is a large amount of serous drainage noted. The wound margin is flat and intact. There is no granulation within the wound bed. There is a large (67-100%) amount of necrotic tissue within the wound bed including Adherent  Slough. The periwound skin appearance exhibited: Moist. The periwound skin appearance did not exhibit: Callus, Crepitus, Excoriation, Fluctuance, Friable, Induration, Localized Edema, Rash, Scarring, Dry/Scaly, Maceration, Atrophie Blanche, Cyanosis, Ecchymosis, Hemosiderin Staining, Mottled, Pallor, Rubor, Erythema. Periwound temperature was noted as No Abnormality. The periwound has tenderness on palpation. Assessment Active Problems ICD-10 I89.0 - Lymphedema, not elsewhere classified L97.222 - Non-pressure chronic ulcer of left calf with fat layer exposed I80.203 - Phlebitis and thrombophlebitis of unspecified deep vessels of lower extremities, bilateral This 82 year old patient has had a history of long-standing lymphedema and seems to be using some sort of limp pumps over the last several years but is not sure who prescribed them and for what reason. At this stage I have recommended: 1. Silver alginate and a Profore lite to be applied and changed at weekly intervals 2. Venous reflux study both lower extremities 3. Discussed elevation and exercise 4. I have asked her to bring her lymphedema pumps so that we can review this and make note of the type 5. Regular visits the wound center Plan Wound Cleansing: Wound #1 Left,Proximal,Lateral Lower Leg: Clean wound with Normal Saline. Wound #2 Left,Distal,Lateral Lower Leg: Clean wound with Normal Saline. Anesthetic: Topical Lidocaine 4% cream applied to wound bed prior to debridement Primary Wound Dressing: [name] CoombsSMITH, [name] J. ([id number]) Wound #1 Left,Proximal,Lateral Lower Leg: Aquacel Ag Wound #2 Left,Distal,Lateral Lower Leg: Aquacel Ag Secondary Dressing: Wound #1 Left,Proximal,Lateral Lower Leg: ABD pad Wound #2 Left,Distal,Lateral Lower Leg: ABD pad Dressing Change Frequency: Wound #1 Left,Proximal,Lateral Lower Leg: Change dressing every week Other: - Monday for dressing change Wound #2 Left,Distal,Lateral Lower Leg: Change  dressing every week Other: - Monday for dressing change Follow-up Appointments: Wound #1 Left,Proximal,Lateral Lower Leg: Return Appointment in 1 week. Nurse Visit as needed - Monday for dressing change Wound #2 Left,Distal,Lateral Lower Leg: Return Appointment in 1 week. Nurse Visit as needed - Monday for dressing change Edema Control: Wound #1 Left,Proximal,Lateral Lower Leg: 3 Layer Compression System - Left Lower Extremity Wound #2 Left,Distal,Lateral Lower Leg: 3 Layer Compression System - Left Lower Extremity Additional Orders / Instructions: Wound #1 Left,Proximal,Lateral Lower Leg: Increase protein intake. Activity as tolerated Wound #2 Left,Distal,Lateral Lower Leg: Increase protein intake. Activity as tolerated Consults ordered were: Vascular - Venous Reflux Studies (bilateral) This 82 year old patient has had a history of long-standing lymphedema and seems to be using some sort of limp pumps over the last several years but is not sure who prescribed them and for what reason. At this stage I have recommended: 1. Silver alginate and a Profore lite to be applied and changed at weekly intervals 2. Venous reflux study both lower extremities 3. Discussed elevation and exercise [name], [name] J. ([id number]) 4. I have asked her to bring her lymphedema pumps so that we can review this and make note of the type 5. Regular visits the wound center Electronic Signature(s) Signed: 06/16/2016 3:44:02 PM By: [name] KannerBritto, Devyn Sheerin MD, FACS Previous Signature: 06/13/2016 11:49:09 AM Version By: [name] KannerBritto, Jerae Izard MD, FACS Entered By: [name] KannerBritto, Almeter Westhoff on 06/16/2016 15:44:02 [name], [name] Shela CommonsJ. ([id number]) -------------------------------------------------------------------------------- ROS/PFSH Details Patient Name: [name] CoombsSMITH, Merlean J. 06/13/2016 10:00 Date of Service: AM Medical Record [id number] Number: Patient Account Number: [id number] Mar 11, 1930 (82 y.o. Treating RN: Elliot GurneyWoody,  [name] Date of Birth/Sex: Female)  Other Clinician: Primary Care Physician: [name] Treating Kyrus Hyde Referring Physician: Etheleen Gordon Physician/Extender: [name] in Treatment: 0 Information Obtained From Patient Wound History Do you currently have one or more open woundso Yes How many open wounds do you currently haveo 2 Approximately how long have you had your woundso 1 month How have you been treating your wound(s) until nowo open to air Has your wound(s) ever healed and then re-openedo No Have you had any lab work done in the past montho No Have you tested positive for an antibiotic resistant organism (MRSA, VRE)o No Have you tested positive for osteomyelitis (bone infection)o No Have you had any tests for circulation on your legso No Have you had other problems associated with your woundso Infection, Swelling Respiratory Complaints and Symptoms: Positive for: Shortness of Breath Negative for: Chronic or frequent coughs Medical History: Positive for: Asthma - controlled Negative for: Aspiration; Chronic Obstructive Pulmonary Disease (COPD); Pneumothorax; Sleep Apnea; Tuberculosis Cardiovascular Complaints and Symptoms: Positive for: Chest pain - once in a while; LE edema - bilaterally Medical History: Positive for: Deep Vein Thrombosis - lower extremties bilaterally Negative for: Angina; Arrhythmia; Congestive Heart Failure; Coronary Artery Disease; Hypertension; Hypotension; Myocardial Infarction; Peripheral Arterial Disease; Peripheral Venous Disease; Phlebitis; Vasculitis Genitourinary Complaints and Symptoms: Positive for: Incontinence/dribbling - small amount of dribbling [place], Elton Gordon Kitchen ([id number]) Negative for: Kidney failure/ Dialysis Medical History: Negative for: End Stage Renal Disease Integumentary (Skin) Complaints and Symptoms: Positive for: Wounds; Bleeding or bruising tendency Negative for: Breakdown; Swelling Medical History: Negative for: History of Burn; History of  pressure wounds Musculoskeletal Complaints and Symptoms: Negative for: Muscle Pain; Muscle Weakness Medical History: Negative for: Gout; Rheumatoid Arthritis; Osteoarthritis; Osteomyelitis Constitutional Symptoms (General Health) Complaints and Symptoms: No Complaints or Symptoms Eyes Complaints and Symptoms: No Complaints or Symptoms Medical History: Negative for: Cataracts; Glaucoma; Optic Neuritis Ear/Nose/Mouth/Throat Complaints and Symptoms: No Complaints or Symptoms Medical History: Negative for: Chronic sinus problems/congestion; Middle ear problems Hematologic/Lymphatic Complaints and Symptoms: No Complaints or Symptoms Medical History: Positive for: Lymphedema Negative for: Anemia; Hemophilia; Human Immunodeficiency Virus; Sickle Cell Disease Worth, Cielo J. ([id number]) Gastrointestinal Complaints and Symptoms: No Complaints or Symptoms Medical History: Negative for: Cirrhosis ; Colitis; Crohnos; Hepatitis A; Hepatitis B Endocrine Complaints and Symptoms: No Complaints or Symptoms Medical History: Negative for: Type I Diabetes; Type II Diabetes Immunological Complaints and Symptoms: No Complaints or Symptoms Medical History: Negative for: Lupus Erythematosus; Raynaudos; Scleroderma Neurologic Complaints and Symptoms: No Complaints or Symptoms Medical History: Negative for: Dementia; Neuropathy; Quadriplegia; Paraplegia; Seizure Disorder Oncologic Complaints and Symptoms: Review of System Notes: Colon cancer 2007 Medical History: Positive for: Received Chemotherapy - 2007; Received Radiation - 2007 Psychiatric Complaints and Symptoms: No Complaints or Symptoms Medical History: Negative for: Anorexia/bulimia; Confinement Anxiety Immunizations Pneumococcal Vaccine: Received Pneumococcal Vaccination: Yes JESSLY, LEBECK ([id number]) Hospitalization / Surgery History Name of Hospital Purpose of Hospitalization/Surgery Date [facility] Colon cancer  07/07/2005 Family and Social History Cancer: Yes - Child; Diabetes: Yes - Child, Maternal Grandparents; Heart Disease: Yes - Maternal Grandparents; Hypertension: No; Kidney Disease: Yes - Maternal Grandparents; Lung Disease: No; Seizures: No; Stroke: No; Thyroid Problems: No; Tuberculosis: No; Former smoker - 20 + years ago; Marital Status - Married; Alcohol Use: Never; Drug Use: No History; Caffeine Use: Never; Advanced Directives: Yes (Not Provided); Patient does not want information on Advanced Directives; Living Will: Yes (Not Provided) Physician Affirmation I have reviewed and agree with the above information. Electronic Signature(s) Signed: 06/13/2016 3:45:07  PM By: [name] Kanner MD, FACS Signed: 06/13/2016 3:55:01 PM By: Elliot Gurney RN, BSN, Kim RN, BSN Entered By: [name] on 06/13/2016 10:52:04 Katrinka Blazing, [name] ([id number]) -------------------------------------------------------------------------------- SuperBill Details Patient Name: Winifred, [name] Date of Service: 06/13/2016 Medical Record Number: [id number] Patient Account Number: [id number] Date of Birth/Sex: 06/27/1930 (82 y.o. Female) Treating RN: [name] Primary Care Physician: [name] Other Clinician: Referring Physician: Etheleen Gordon Treating Physician/Extender: Rudene Re in Treatment: 0 Diagnosis Coding ICD-10 Codes Code Description I89.0 Lymphedema, not elsewhere classified L97.222 Non-pressure chronic ulcer of left calf with fat layer exposed I80.203 Phlebitis and thrombophlebitis of unspecified deep vessels of lower extremities, bilateral Facility Procedures CPT4: Description Modifier Quantity Code [id number] 99213 - WOUND CARE VISIT-LEV 3 EST PT 1 CPT4: 30865784 (Facility Use Only) 29581LT - APPLY MULTLAY COMPRS LWR LT 1 LEG Physician Procedures CPT4: Description Modifier Quantity Code [id number] 99204 - WC PHYS LEVEL 4 - NEW PT 1 ICD-10 Description Diagnosis I89.0 Lymphedema, not elsewhere  classified L97.222 Non-pressure chronic ulcer of left calf with fat layer exposed I80.203 Phlebitis and  thrombophlebitis of unspecified deep vessels of lower extremities, bilateral Electronic Signature(s) Signed: 06/16/2016 3:42:50 PM By: [name] Kanner MD, FACS Signed: 06/16/2016 6:04:04 PM By: Elliot Gurney RN, BSN, Kim RN, BSN Previous Signature: 06/13/2016 11:50:17 AM Version By: [name] Kanner MD, FACS Entered By: Elliot Gurney RN, BSN, [name] on 06/16/2016 15:30:47

## 2016-06-14 NOTE — Progress Notes (Addendum)
[place], [place] ([id number]) Visit Report for 06/13/2016 Allergy List Details Patient Name: [name], [name]. Date of Service: 06/13/2016 10:00 AM Medical Record Number: [id number] Patient Account Number: [id number] Date of Birth/Sex: Nov 28, 1929 (82 y.o. Female) Treating RN: Huel Coventry Primary Care Physician: Etheleen Nicks Other Clinician: Referring Physician: Etheleen Nicks Treating Physician/Extender: Rudene Re in Treatment: 0 Allergies Active Allergies aspirin Allergy Notes Electronic Signature(s) Signed: 06/13/2016 3:55:01 PM By: [name] Gurney, RN, Gordon, [name] Entered By: [name] Gurney, RN, Gordon, [name] on 06/13/2016 10:39:30 [name], [name] ([id number]) -------------------------------------------------------------------------------- Arrival Information Details Patient Name: [name], [name]. Date of Service: 06/13/2016 10:00 AM Medical Record Number: [id number] Patient Account Number: [id number] Date of Birth/Sex: 02/19/1930 (82 y.o. Female) Treating RN: Huel Coventry Primary Care Physician: Etheleen Nicks Other Clinician: Referring Physician: Etheleen Nicks Treating Physician/Extender: Rudene Re in Treatment: 0 Visit Information Patient Arrived: Ambulatory Arrival Time: 10:08 Accompanied By: husband in lobby Transfer Assistance: None Patient Identification Verified: Yes Secondary Verification Process Yes Completed: Patient Requires Transmission- No Based Precautions: Patient Has Alerts: Yes Patient Alerts: Patient on Blood Thinner Warfarin Electronic Signature(s) Signed: 06/13/2016 3:55:01 PM By: [name] Gurney, RN, Gordon, [name] Entered By: [name] Gurney, RN, Gordon, [name] on 06/13/2016 10:09:33 [name], [name] ([id number]) -------------------------------------------------------------------------------- Clinic Level of Care Assessment Details Patient Name: [name], [name] Date of Service: 06/13/2016 10:00 AM Medical Record Number: [id number] Patient Account Number: [id number] Date of  Birth/Sex: 1929-08-14 (82 y.o. Female) Treating RN: Huel Coventry Primary Care Physician: Etheleen Nicks Other Clinician: Referring Physician: Etheleen Nicks Treating Physician/Extender: Rudene Re in Treatment: 0 Clinic Level of Care Assessment Items TOOL 1 Quantity Score []  - Use when EandM and Procedure is performed on INITIAL visit 0 ASSESSMENTS - Nursing Assessment / Reassessment X - General Physical Exam (combine w/ comprehensive assessment (listed just 1 20 below) when performed on new pt. evals) X - Comprehensive Assessment (HX, ROS, Risk Assessments, Wounds Hx, etc.) 1 25 ASSESSMENTS - Wound and Skin Assessment / Reassessment []  - Dermatologic / Skin Assessment (not related to wound area) 0 ASSESSMENTS - Ostomy and/or Continence Assessment and Care []  - Incontinence Assessment and Management 0 []  - Ostomy Care Assessment and Management (repouching, etc.) 0 PROCESS - Coordination of Care X - Simple Patient / Family Education for ongoing care 1 15 []  - Complex (extensive) Patient / Family Education for ongoing care 0 X - Staff obtains Consents, Records, Test Results / Process Orders 1 10 []  - Staff telephones HHA, Nursing Homes / Clarify orders / etc 0 []  - Routine Transfer to another Facility (non-emergent condition) 0 []  - Routine Hospital Admission (non-emergent condition) 0 X - New Admissions / Manufacturing engineer / Ordering NPWT, Apligraf, etc. 1 15 []  - Emergency Hospital Admission (emergent condition) 0 PROCESS - Special Needs []  - Pediatric / Minor Patient Management 0 []  - Isolation Patient Management 0 [name], [name] ([id number]) []  - Hearing / Language / Visual special needs 0 []  - Assessment of Community assistance (transportation, D/C planning, etc.) 0 []  - Additional assistance / Altered mentation 0 []  - Support Surface(s) Assessment (bed, cushion, seat, etc.) 0 INTERVENTIONS - Miscellaneous []  - External ear exam 0 []  - Patient Transfer  (multiple staff / Nurse, adult / Similar devices) 0 []  - Simple Staple / Suture removal (25 or less) 0 []  - Complex Staple / Suture removal (26 or more) 0 []  - Hypo/Hyperglycemic Management (do not check if billed separately) 0 X - Ankle / Brachial Index (ABI) - do not check  if billed separately 1 15 Has the patient been seen at the hospital within the last three years: Yes Total Score: 100 Level Of Care: New/Established - Level 3 Electronic Signature(s) Signed: 06/16/2016 6:04:04 PM By: [name] GurneyWoody, RN, Gordon, [name] Entered By: [name] GurneyWoody, RN, Gordon, [name] on 06/16/2016 15:30:03 [name], [name] ([id number]) -------------------------------------------------------------------------------- Encounter Discharge Information Details Patient Name: [name], [name] Date of Service: 06/13/2016 10:00 AM Medical Record Number: [id number] Patient Account Number: [id number] Date of Birth/Sex: March 31, 1930 (82 y.o. Female) Treating RN: Huel CoventryWoody, [name] Primary Care Physician: Etheleen NicksMACDONALD, KERI Other Clinician: Referring Physician: Etheleen NicksMACDONALD, KERI Treating Physician/Extender: Rudene ReBritto, Errol Weeks in Treatment: 0 Encounter Discharge Information Items Discharge Pain Level: 0 Discharge Condition: Stable Ambulatory Status: Ambulatory Emergency Discharge Destination: Room Transportation: Private Auto Accompanied By: self Schedule Follow-up Appointment: Yes Medication Reconciliation completed and provided to Patient/Care Yes Jaryn Rosko: Provided on Clinical Summary of Care: 06/13/2016 Form Type Recipient Paper Patient IS Electronic Signature(s) Signed: 06/16/2016 6:04:04 PM By: [name] GurneyWoody, RN, Gordon, [name] Previous Signature: 06/13/2016 11:18:40 AM Version By: Gwenlyn PerkingMoore, Shelia Entered By: [name] GurneyWoody, RN, Gordon, [name] on 06/16/2016 15:31:59 [name], [name] ([id number]) -------------------------------------------------------------------------------- Lower Extremity Assessment Details Patient Name: [place], [name] Date of Service: 06/13/2016  10:00 AM Medical Record Number: [id number] Patient Account Number: [id number] Date of Birth/Sex: March 31, 1930 (82 y.o. Female) Treating RN: Huel CoventryWoody, [name] Primary Care Physician: Etheleen NicksMACDONALD, KERI Other Clinician: Referring Physician: Etheleen NicksMACDONALD, KERI Treating Physician/Extender: Rudene ReBritto, Errol Weeks in Treatment: 0 Edema Assessment Assessed: [Left: No] [Right: No] Edema: [Left: Yes] [Right: Yes] Calf Left: Right: Point of Measurement: 30 cm From Medial Instep 48.5 cm 40.5 cm Ankle Left: Right: Point of Measurement: 13 cm From Medial Instep 29.2 cm 28.4 cm Vascular Assessment Claudication: Claudication Assessment [Left:None] [Right:None] Pulses: Posterior Tibial Palpable: [Left:No] [Right:No] Doppler: [Left:Monophasic] [Right:Monophasic] Dorsalis Pedis Palpable: [Left:Yes] [Right:Yes] Doppler: [Left:Monophasic] [Right:Monophasic] Extremity colors, hair growth, and conditions: Extremity Color: [Left:Red] [Right:Hyperpigmented] Hair Growth on Extremity: [Left:No] [Right:No] Temperature of Extremity: [Left:Warm] [Right:Warm] Capillary Refill: [Left:< 3 seconds] [Right:< 3 seconds] Dependent Rubor: [Left:No] [Right:No] Blanched when Elevated: [Left:No] Lipodermatosclerosis: [Left:No] [Right:No] Blood Pressure: Brachial: [Left:110] [Right:146] Dorsalis Pedis: [Left:Dorsalis Pedis: 120] Ankle: Posterior Tibial: 130 [Left:Posterior Tibial: 125 0.89] [Right:0.86] Toe Nail Assessment [facility], [name] J. ([id number]) Left: Right: Thick: No No Discolored: No No Deformed: No No Improper Length and Hygiene: Yes Yes Electronic Signature(s) Signed: 06/13/2016 3:55:01 PM By: [name] GurneyWoody, RN, Gordon, [name] Entered By: [name] GurneyWoody, RN, Gordon, [name] on 06/13/2016 10:29:37 Benfer, [name] ([id number]) -------------------------------------------------------------------------------- Multi Wound Chart Details Patient Name: [place], [name] Date of Service: 06/13/2016 10:00 AM Medical Record Number: [id number] Patient  Account Number: [id number] Date of Birth/Sex: March 31, 1930 (82 y.o. Female) Treating RN: Huel CoventryWoody, [name] Primary Care Physician: Etheleen NicksMACDONALD, KERI Other Clinician: Referring Physician: Etheleen NicksMACDONALD, KERI Treating Physician/Extender: Rudene ReBritto, Errol Weeks in Treatment: 0 Photos: [N/A:N/A] Wound Location: Left Lower Leg - Lateral, Left Lower Leg - Lateral, N/A Proximal Distal Wounding Event: Gradually Appeared Gradually Appeared N/A Primary Etiology: Trauma, Other Lymphedema N/A Secondary Etiology: Lymphedema Venous Leg Ulcer N/A Date Acquired: 05/14/2016 05/14/2016 N/A Weeks of Treatment: 0 0 N/A Wound Status: Open Open N/A Measurements L x W x D 0.4x3x0.1 1.5x1.8x0.2 N/A (cm) Area (cm) : 0.942 2.121 N/A Volume (cm) : 0.094 0.424 N/A Classification: Partial Thickness Full Thickness Without N/A Exposed Support Structures Exudate Amount: Small Large N/A Exudate Type: Serosanguineous Serous N/A Exudate Color: red, brown amber N/A Wound Margin: Flat and Intact Flat and Intact N/A Granulation Amount: Medium (34-66%) None Present (0%) N/A Granulation Quality: Red  N/A N/A Necrotic Amount: Medium (34-66%) Large (67-100%) N/A Necrotic Tissue: Eschar Adherent Slough N/A Exposed Structures: Fascia: No Fat: Yes N/A Fat: No Fascia: No Tendon: No Tendon: No Muscle: No Muscle: No Joint: No Joint: No Bone: No Bone: No Limited to Skin Breakdown Epithelialization: N/A None N/A Periwound Skin Texture: N/A Heinle, [name] ([id number]) Excoriation: Yes Edema: No Edema: No Excoriation: No Induration: No Induration: No Callus: No Callus: No Crepitus: No Crepitus: No Fluctuance: No Fluctuance: No Friable: No Friable: No Rash: No Rash: No Scarring: No Scarring: No Periwound Skin Moist: Yes Moist: Yes N/A Moisture: Maceration: No Maceration: No Dry/Scaly: No Dry/Scaly: No Periwound Skin Color: Ecchymosis: Yes Atrophie Blanche: No N/A Atrophie Blanche: No Cyanosis: No Cyanosis:  No Ecchymosis: No Erythema: No Erythema: No Hemosiderin Staining: No Hemosiderin Staining: No Mottled: No Mottled: No Pallor: No Pallor: No Rubor: No Rubor: No Temperature: N/A No Abnormality N/A Tenderness on No Yes N/A Palpation: Wound Preparation: Ulcer Cleansing: Ulcer Cleansing: N/A Rinsed/Irrigated with Rinsed/Irrigated with Saline Saline Topical Anesthetic Topical Anesthetic Applied: None Applied: Other: lidociane 4% Assessment Notes: skin tear N/A N/A Treatment Notes Electronic Signature(s) Signed: 06/16/2016 6:04:04 PM By: [name] Gurney, RN, Gordon, [name] Entered By: [name] Gurney, RN, Gordon, [name] on 06/16/2016 15:29:09 [place], [name] ([id number]) -------------------------------------------------------------------------------- Multi-Disciplinary Care Plan Details Patient Name: [place], [name] Date of Service: 06/13/2016 10:00 AM Medical Record Number: [id number] Patient Account Number: [id number] Date of Birth/Sex: 07-27-1929 (82 y.o. Female) Treating RN: Huel Coventry Primary Care Physician: Etheleen Nicks Other Clinician: Referring Physician: Etheleen Nicks Treating Physician/Extender: Rudene Re in Treatment: 0 Active Inactive Abuse / Safety / Falls / Self Care Management Nursing Diagnoses: Impaired physical mobility Potential for falls Goals: Patient will remain injury free Date Initiated: 06/16/2016 Goal Status: Active Interventions: Assess fall risk on admission and as needed Assess self care needs on admission and as needed Notes: Orientation to the Wound Care Program Nursing Diagnoses: Knowledge deficit related to the wound healing center program Goals: Patient/caregiver will verbalize understanding of the Wound Healing Center Program Date Initiated: 06/16/2016 Goal Status: Active Interventions: Provide education on orientation to the wound center Notes: Venous Leg Ulcer Nursing Diagnoses: Potential for venous Insuffiency (use before diagnosis  confirmed) GoalsSEYNABOU, FULTS. ([id number]) Non-invasive venous studies are completed as ordered Date Initiated: 06/16/2016 Goal Status: Active Interventions: Assess peripheral edema status every visit. Compression as ordered Treatment Activities: Non-invasive vascular studies : 06/13/2016 Therapeutic compression applied : 06/13/2016 Notes: Wound/Skin Impairment Nursing Diagnoses: Impaired tissue integrity Goals: Patient/caregiver will verbalize understanding of skin care regimen Date Initiated: 06/16/2016 Goal Status: Active Ulcer/skin breakdown will heal within 14 weeks Date Initiated: 06/16/2016 Goal Status: Active Interventions: Assess patient/caregiver ability to obtain necessary supplies Assess ulceration(s) every visit Notes: Electronic Signature(s) Signed: 06/16/2016 6:04:04 PM By: [name] Gurney, RN, Gordon, [name] Entered By: [name] Gurney, RN, Gordon, [name] on 06/16/2016 15:28:49 Farner, [name] ([id number]) -------------------------------------------------------------------------------- Pain Assessment Details Patient Name: [place], [name]. Date of Service: 06/13/2016 10:00 AM Medical Record Number: [id number] Patient Account Number: [id number] Date of Birth/Sex: January 31, 1930 (82 y.o. Female) Treating RN: Huel Coventry Primary Care Physician: Etheleen Nicks Other Clinician: Referring Physician: Etheleen Nicks Treating Physician/Extender: Rudene Re in Treatment: 0 Active Problems Location of Pain Severity and Description of Pain Patient Has Paino Yes Site Locations Pain Location: Pain in Ulcers With Dressing Change: Yes Duration of the Pain. Constant / Intermittento Constant Rate the pain. Current Pain Level: 4 Character of Pain Describe the Pain: Aching, Throbbing Pain Management and  Medication Current Pain Management: Medication: No Cold Application: No Rest: No Massage: No Activity: No T.E.N.S.: No Heat Application: No Leg drop or elevation: No Is the  Current Pain Management Inadequate Adequate: How does your pain impact your activities of daily livingo Sleep: Yes Bathing: Yes Appetite: Yes Relationship With Others: Yes Bladder Continence: Yes Emotions: Yes Bowel Continence: Yes Work: Yes Toileting: Yes Drive: Yes Dressing: Yes Hobbies: Yes Goals for Pain Management Topical or injectable lidocaine is offered to patient for acute pain when surgical debridement is performed. If needed, Patient is instructed to use over the counter pain medication for the following 24-48 hours after [place], Thessaly J. ([id number]) debridement. Wound care MDs do not prescribed pain medications. Patient has chronic pain or uncontrolled pain. Patient has been instructed to make an appointment with their Primary Care Physician for pain management. Electronic Signature(s) Signed: 06/13/2016 3:55:01 PM By: [name] Gurney, RN, Gordon, [name] Entered By: [name] Gurney, RN, Gordon, [name] on 06/13/2016 10:10:34 Lesinski, [name] ([id number]) -------------------------------------------------------------------------------- Patient/Caregiver Education Details Patient Name: [place], [name] Date of Service: 06/13/2016 10:00 AM Medical Record Number: [id number] Patient Account Number: [id number] Date of Birth/Gender: September 16, 1929 (82 y.o. Female) Treating RN: Huel Coventry Primary Care Physician: Etheleen Nicks Other Clinician: Referring Physician: Etheleen Nicks Treating Physician/Extender: Rudene Re in Treatment: 0 Education Assessment Education Provided To: Patient Education Topics Provided Venous: Handouts: Controlling Swelling with Multilayered Compression Wraps Methods: Demonstration, Explain/Verbal Responses: State content correctly Welcome To The Wound Care Center: Handouts: Welcome To The Wound [facility] Center Methods: Explain/Verbal Responses: State content correctly Wound/Skin Impairment: Electronic Signature(s) Signed: 06/16/2016 6:04:04 PM By: [name] Gurney, RN, Gordon, Kim  RN, Gordon Entered By: [name] Gurney, RN, Gordon, [name] on 06/16/2016 15:32:29 Gillean, [name] ([id number]) -------------------------------------------------------------------------------- Wound Assessment Details Patient Name: [place], [name]. Date of Service: 06/13/2016 10:00 AM Medical Record Number: [id number] Patient Account Number: [id number] Date of Birth/Sex: 1930/05/21 (82 y.o. Female) Treating RN: Huel Coventry Primary Care Physician: Etheleen Nicks Other Clinician: Referring Physician: Etheleen Nicks Treating Physician/Extender: Rudene Re in Treatment: 0 Wound Status Wound Number: 1 Primary Etiology: Trauma, Other Wound Location: Left Lower Leg - Lateral, Secondary Etiology: Lymphedema Proximal Wound Status: Open Wounding Event: Gradually Appeared Date Acquired: 05/14/2016 Weeks Of Treatment: 0 Clustered Wound: No Photos Wound Measurements Length: (cm) 0.4 % Reduction Width: (cm) 3 % Reduction Depth: (cm) 0.1 Area: (cm) 0.942 Volume: (cm) 0.094 in Area: in Volume: Wound Description Classification: Partial Thickness Wound Margin: Flat and Intact Exudate Amount: Small Exudate Type: Serosanguineous Exudate Color: red, brown Wound Bed Granulation Amount: Medium (34-66%) Exposed Structure Granulation Quality: Red Fascia Exposed: No Necrotic Amount: Medium (34-66%) Fat Layer Exposed: No Necrotic Quality: Eschar Tendon Exposed: No Muscle Exposed: No Joint Exposed: No Bone Exposed: No Desmarais, [name] J. ([id number]) Limited to Skin Breakdown Periwound Skin Texture Texture Color No Abnormalities Noted: No No Abnormalities Noted: No Callus: No Atrophie Blanche: No Crepitus: No Cyanosis: No Excoriation: Yes Ecchymosis: Yes Fluctuance: No Erythema: No Friable: No Hemosiderin Staining: No Induration: No Mottled: No Localized Edema: No Pallor: No Rash: No Rubor: No Scarring: No Moisture No Abnormalities Noted: No Dry / Scaly: No Maceration: No Moist:  Yes Wound Preparation Ulcer Cleansing: Rinsed/Irrigated with Saline Topical Anesthetic Applied: None Assessment Notes skin tear Electronic Signature(s) Signed: 06/13/2016 3:55:01 PM By: [name] Gurney, RN, Gordon, [name] Entered By: [name] Gurney, RN, Gordon, [name] on 06/13/2016 10:36:00 Ramesh, [name] ([id number]) -------------------------------------------------------------------------------- Wound Assessment Details Patient Name: [place], [name]. Date of Service: 06/13/2016 10:00 AM Medical Record Number:  [id number] Patient Account Number: [id number] Date of Birth/Sex: 27-Jul-1929 (82 y.o. Female) Treating RN: Huel CoventryWoody, [name] Primary Care Physician: Etheleen NicksMACDONALD, KERI Other Clinician: Referring Physician: Etheleen NicksMACDONALD, KERI Treating Physician/Extender: Rudene ReBritto, Errol Weeks in Treatment: 0 Wound Status Wound Number: 2 Primary Etiology: Lymphedema Wound Location: Left Lower Leg - Lateral, Distal Secondary Etiology: Venous Leg Ulcer Wounding Event: Gradually Appeared Wound Status: Open Date Acquired: 05/14/2016 Weeks Of Treatment: 0 Clustered Wound: No Photos Wound Measurements Length: (cm) 1.5 Width: (cm) 1.8 Depth: (cm) 0.2 Area: (cm) 2.121 Volume: (cm) 0.424 % Reduction in Area: % Reduction in Volume: Epithelialization: None Tunneling: No Undermining: No Wound Description Full Thickness Without Exposed Classification: Support Structures Wound Margin: Flat and Intact Exudate Large Amount: Exudate Type: Serous Exudate Color: amber Foul Odor After Cleansing: No Wound Bed Granulation Amount: None Present (0%) Exposed Structure Necrotic Amount: Large (67-100%) Fascia Exposed: No Necrotic Quality: Adherent Slough Fat Layer Exposed: Yes Tendon Exposed: No Muscle Exposed: No Joint Exposed: No Yakel, Lilas J. ([id number]) Bone Exposed: No Periwound Skin Texture Texture Color No Abnormalities Noted: No No Abnormalities Noted: No Callus: No Atrophie Blanche: No Crepitus: No Cyanosis:  No Excoriation: No Ecchymosis: No Fluctuance: No Erythema: No Friable: No Hemosiderin Staining: No Induration: No Mottled: No Localized Edema: No Pallor: No Rash: No Rubor: No Scarring: No Temperature / Pain Moisture Temperature: No Abnormality No Abnormalities Noted: No Tenderness on Palpation: Yes Dry / Scaly: No Maceration: No Moist: Yes Wound Preparation Ulcer Cleansing: Rinsed/Irrigated with Saline Topical Anesthetic Applied: Other: lidociane 4%, Treatment Notes Wound #2 (Left, Distal, Lateral Lower Leg) 1. Cleansed with: Clean wound with Normal Saline 2. Anesthetic Topical Lidocaine 4% cream to wound bed prior to debridement 4. Dressing Applied: Aquacel Ag 5. Secondary Dressing Applied ABD Pad 7. Secured with 3 Layer Compression System - Left Lower Extremity Electronic Signature(s) Signed: 06/13/2016 3:55:01 PM By: [name] GurneyWoody, RN, Gordon, [name] Entered By: [name] GurneyWoody, RN, Gordon, [name] on 06/13/2016 10:38:46

## 2016-06-14 NOTE — Progress Notes (Signed)
[name], [name] J. ([id number]) Visit Report for 06/13/2016 Abuse/Suicide Risk Screen Details Patient Name: [name], [name] J. 06/13/2016 10:00 Date of Service: AM Medical Record [id number] Number: Patient Account Number: [id number] February 08, 1930 (82 y.o. Treating RN: Huel CoventryWoody, Kim Date of Birth/Sex: Female) Other Clinician: Primary Care Physician: Etheleen NicksMACDONALD, KERI Treating Britto, Errol Referring Physician: Etheleen NicksMACDONALD, KERI Physician/Extender: Tania AdeWeeks in Treatment: 0 Abuse/Suicide Risk Screen Items Answer ABUSE/SUICIDE RISK SCREEN: Has anyone close to you tried to hurt or harm you recentlyo No Do you feel uncomfortable with anyone in your familyo No Has anyone forced you do things that you didnot want to doo No Do you have any thoughts of harming yourselfo No Patient displays signs or symptoms of abuse and/or neglect. No Electronic Signature(s) Signed: 06/13/2016 3:55:01 PM By: Elliot GurneyWoody, RN, BSN, Kim RN, BSN Entered By: Elliot GurneyWoody, RN, BSN, Kim on 06/13/2016 10:48:25 [name], [name] ([id number]) -------------------------------------------------------------------------------- Activities of Daily Living Details Patient Name: [name], [name] J. 06/13/2016 10:00 Date of Service: AM Medical Record [id number] Number: Patient Account Number: [id number] February 08, 1930 (82 y.o. Treating RN: Huel CoventryWoody, Kim Date of Birth/Sex: Female) Other Clinician: Primary Care Physician: Etheleen NicksMACDONALD, KERI Treating Evlyn KannerBritto, Errol Referring Physician: Etheleen NicksMACDONALD, KERI Physician/Extender: Tania AdeWeeks in Treatment: 0 Activities of Daily Living Items Answer Activities of Daily Living (Please select one for each item) Drive Automobile Completely Able Take Medications Completely Able Use Telephone Completely Able Care for Appearance Completely Able Use Toilet Completely Able Bath / Shower Completely Able Dress Self Completely Able Feed Self Completely Able Walk Completely Able Get In / Out Bed Completely Able Housework Completely Able Prepare  Meals Completely Able Handle Money Completely Able Shop for Self Completely Able Electronic Signature(s) Signed: 06/13/2016 3:55:01 PM By: Elliot GurneyWoody, RN, BSN, Kim RN, BSN Entered By: Elliot GurneyWoody, RN, BSN, Kim on 06/13/2016 10:48:37 [name], [name] ([id number]) -------------------------------------------------------------------------------- Education Assessment Details Patient Name: [name], [name] J. 06/13/2016 10:00 Date of Service: AM Medical Record [id number] Number: Patient Account Number: [id number] February 08, 1930 (82 y.o. Treating RN: Huel CoventryWoody, Kim Date of Birth/Sex: Female) Other Clinician: Primary Care Physician: Etheleen NicksMACDONALD, KERI Treating Evlyn KannerBritto, Errol Referring Physician: Etheleen NicksMACDONALD, KERI Physician/Extender: Tania AdeWeeks in Treatment: 0 Primary Learner Assessed: Patient Learning Preferences/Education Level/Primary Language Learning Preference: Explanation, Demonstration Highest Education Level: High School Preferred Language: English Cognitive Barrier Assessment/Beliefs Language Barrier: No Translator Needed: No Memory Deficit: No Emotional Barrier: No Physical Barrier Assessment Impaired Vision: Yes Glasses Impaired Hearing: No Decreased Hand dexterity: No Knowledge/Comprehension Assessment Knowledge Level: Medium Comprehension Level: Medium Ability to understand written Medium instructions: Ability to understand verbal Medium instructions: Motivation Assessment Anxiety Level: Calm Cooperation: Cooperative Education Importance: Acknowledges Need Interest in Health Problems: Asks Questions Perception: Coherent Willingness to Engage in Self- High Management Activities: Readiness to Engage in Self- High Management Activities: Electronic Signature(s) [name], [name] J. ([id number]) Signed: 06/13/2016 3:55:01 PM By: Elliot GurneyWoody, RN, BSN, Kim RN, BSN Entered By: Elliot GurneyWoody, RN, BSN, Kim on 06/13/2016 10:49:14 [name], [name]  ([id number]) -------------------------------------------------------------------------------- Fall Risk Assessment Details Patient Name: [name], [name] J. 06/13/2016 10:00 Date of Service: AM Medical Record [id number] Number: Patient Account Number: [id number] February 08, 1930 (82 y.o. Treating RN: Huel CoventryWoody, Kim Date of Birth/Sex: Female) Other Clinician: Primary Care Physician: Etheleen NicksMACDONALD, KERI Treating Britto, Errol Referring Physician: Etheleen NicksMACDONALD, KERI Physician/Extender: Tania AdeWeeks in Treatment: 0 Fall Risk Assessment Items Have you had 2 or more falls in the last 12 monthso 0 No Have you had any fall that resulted in injury in the last 12 monthso 0 No FALL RISK ASSESSMENT: History of falling - immediate or within 3 months 0 No Secondary diagnosis  0 No Ambulatory aid None/bed rest/wheelchair/nurse 0 Yes Crutches/cane/walker 0 No Furniture 0 No IV Access/Saline Lock 0 No Gait/Training Normal/bed rest/immobile 0 Yes Weak 0 No Impaired 0 No Mental Status Oriented to own ability 0 Yes Electronic Signature(s) Signed: 06/13/2016 3:55:01 PM By: Elliot GurneyWoody, RN, BSN, Kim RN, BSN Entered By: Elliot GurneyWoody, RN, BSN, Kim on 06/13/2016 10:49:29 [name], [name] ([id number]) -------------------------------------------------------------------------------- Foot Assessment Details Patient Name: [name], [name] J. 06/13/2016 10:00 Date of Service: AM Medical Record [id number] Number: Patient Account Number: [id number] 1929-10-21 (82 y.o. Treating RN: Huel CoventryWoody, Kim Date of Birth/Sex: Female) Other Clinician: Primary Care Physician: Etheleen NicksMACDONALD, KERI Treating Britto, Errol Referring Physician: Etheleen NicksMACDONALD, KERI Physician/Extender: Tania AdeWeeks in Treatment: 0 Foot Assessment Items Site Locations + = Sensation present, - = Sensation absent, C = Callus, U = Ulcer R = Redness, W = Warmth, M = Maceration, PU = Pre-ulcerative lesion F = Fissure, S = Swelling, D = Dryness Assessment Right: Left: Other Deformity: No No Prior Foot Ulcer:  No No Prior Amputation: No No Charcot Joint: No No Ambulatory Status: Ambulatory Without Help Gait: Steady Electronic Signature(s) Signed: 06/13/2016 3:55:01 PM By: Elliot GurneyWoody, RN, BSN, Kim RN, BSN Entered By: Elliot GurneyWoody, RN, BSN, Kim on 06/13/2016 10:50:05 Graser, [name] ([id number]) -------------------------------------------------------------------------------- Nutrition Risk Assessment Details Patient Name: [name], [name] J. 06/13/2016 10:00 Date of Service: AM Medical Record [id number] Number: Patient Account Number: [id number] 1929-10-21 (82 y.o. Treating RN: Huel CoventryWoody, Kim Date of Birth/Sex: Female) Other Clinician: Primary Care Physician: Etheleen NicksMACDONALD, KERI Treating Evlyn KannerBritto, Errol Referring Physician: Etheleen NicksMACDONALD, KERI Physician/Extender: Tania AdeWeeks in Treatment: 0 Height (in): Weight (lbs): Body Mass Index (BMI): Nutrition Risk Assessment Items NUTRITION RISK SCREEN: I have an illness or condition that made me change the kind and/or 0 No amount of food I eat I eat fewer than two meals per day 0 No I eat few fruits and vegetables, or milk products 0 No I have three or more drinks of beer, liquor or wine almost every day 0 No I have tooth or mouth problems that make it hard for me to eat 0 No I don't always have enough money to buy the food I need 0 No I eat alone most of the time 0 No I take three or more different prescribed or over-the-counter drugs a 1 Yes day Without wanting to, I have lost or gained 10 pounds in the last six 0 No months I am not always physically able to shop, cook and/or feed myself 0 No Nutrition Protocols Good Risk Protocol 0 No interventions needed Moderate Risk Protocol Electronic Signature(s) Signed: 06/13/2016 3:55:01 PM By: Elliot GurneyWoody, RN, BSN, Kim RN, BSN Entered By: Elliot GurneyWoody, RN, BSN, Kim on 06/13/2016 10:49:40

## 2016-06-16 DIAGNOSIS — L97222 Non-pressure chronic ulcer of left calf with fat layer exposed: Secondary | ICD-10-CM | POA: Diagnosis not present

## 2016-06-17 NOTE — Progress Notes (Signed)
AVYA, FLAVELL ([id number]) Visit Report for 06/16/2016 Arrival Information Details Patient Name: [name], [name] 06/16/2016 2:45 Date of Service: PM Medical Record [id number] Number: Patient Account Number: [id number] 07-Jan-1930 (82 y.o. Treating RN: Huel Coventry Date of Birth/Sex: Female) Other Clinician: Primary Care Physician: Etheleen Nicks Treating Britto, Errol Referring Physician: Etheleen Nicks Physician/Extender: Tania Ade in Treatment: 0 Visit Information History Since Last Visit Added or deleted any medications: No Patient Arrived: Ambulatory Any new allergies or adverse reactions: No Arrival Time: 15:32 Had a fall or experienced change in No Accompanied By: self activities of daily living that may affect Transfer Assistance: None risk of falls: Patient Identification Verified: Yes Signs or symptoms of abuse/neglect since last No Secondary Verification Process Yes visito Completed: Hospitalized since last visit: No Patient Requires Transmission- No Has Dressing in Place as Prescribed: Yes Based Precautions: Has Compression in Place as Prescribed: Yes Patient Has Alerts: Yes Pain Present Now: No Patient Alerts: Patient on Blood Thinner Warfarin Electronic Signature(s) Signed: 06/16/2016 6:04:04 PM By: Elliot Gurney, RN, BSN, Kim RN, BSN Entered By: Elliot Gurney, RN, BSN, Kim on 06/16/2016 15:33:20 Laramee, [name] ([id number]) -------------------------------------------------------------------------------- Compression Therapy Details Patient Name: [name], [name] 06/16/2016 2:45 Date of Service: PM Medical Record [id number] Number: Patient Account Number: [id number] 1930-06-29 (82 y.o. Treating RN: Huel Coventry Date of Birth/Sex: Female) Other Clinician: Primary Care Physician: Etheleen Nicks Treating Evlyn Kanner Referring Physician: Etheleen Nicks Physician/Extender: Tania Ade in Treatment: 0 Compression Therapy Performed for Wound Wound #1 Left,Proximal,Lateral Lower  Leg Assessment: Performed By: Clinician Huel Coventry, RN Compression Type: Three Layer Pre Treatment ABI: 0.9 Electronic Signature(s) Signed: 06/16/2016 6:04:04 PM By: Elliot Gurney, RN, BSN, Kim RN, BSN Entered By: Elliot Gurney, RN, BSN, Kim on 06/16/2016 15:34:25 Vicars, [name] ([id number]) -------------------------------------------------------------------------------- Compression Therapy Details Patient Name: [name], [name] 06/16/2016 2:45 Date of Service: PM Medical Record [id number] Number: Patient Account Number: [id number] 12-04-1929 (82 y.o. Treating RN: Huel Coventry Date of Birth/Sex: Female) Other Clinician: Primary Care Physician: Etheleen Nicks Treating Evlyn Kanner Referring Physician: Etheleen Nicks Physician/Extender: Tania Ade in Treatment: 0 Compression Therapy Performed for Wound Wound #2 Left,Distal,Lateral Lower Leg Assessment: Performed By: Clinician Huel Coventry, RN Compression Type: Three Layer Pre Treatment ABI: 0.9 Electronic Signature(s) Signed: 06/16/2016 6:04:04 PM By: Elliot Gurney, RN, BSN, Kim RN, BSN Entered By: Elliot Gurney, RN, BSN, Kim on 06/16/2016 15:34:25 Glotfelty, [name] ([id number]) -------------------------------------------------------------------------------- Encounter Discharge Information Details Patient Name: [name], [name] 06/16/2016 2:45 Date of Service: PM Medical Record [id number] Number: Patient Account Number: [id number] [birth year] (82 y.o. Treating RN: Huel Coventry Date of Birth/Sex: Female) Other Clinician: Primary Care Physician: Etheleen Nicks Treating Evlyn Kanner Referring Physician: Etheleen Nicks Physician/Extender: Tania Ade in Treatment: 0 Encounter Discharge Information Items Discharge Pain Level: 0 Discharge Condition: Stable Ambulatory Status: Ambulatory Discharge Destination: Home Private Transportation: Auto Accompanied By: self Schedule Follow-up Appointment: Yes Medication Reconciliation completed and Yes provided to Patient/Care  Kierah Goatley: Clinical Summary of Care: Electronic Signature(s) Signed: 06/16/2016 6:04:04 PM By: Elliot Gurney, RN, BSN, Kim RN, BSN Entered By: Elliot Gurney, RN, BSN, Kim on 06/16/2016 15:36:01 Sonn, [name] ([id number]) -------------------------------------------------------------------------------- Patient/Caregiver Education Details Patient Name: [name], [name] 06/16/2016 2:45 Date of Service: PM Medical Record [id number] Number: Patient Account Number: [id number] 26-Feb-1930 (82 y.o. Treating RN: Huel Coventry Date of Birth/Gender: Female) Other Clinician: Primary Care Physician: Etheleen Nicks Treating Evlyn Kanner Referring Physician: Etheleen Nicks Physician/Extender: Tania Ade in Treatment: 0 Education Assessment Education Provided To: Patient Education Topics Provided Venous: Handouts: Controlling Swelling with Compression Stockings , Other: do not get wet, elevate Methods:  Demonstration, Explain/Verbal Responses: State content correctly Electronic Signature(s) Signed: 06/16/2016 6:04:04 PM By: Elliot GurneyWoody, RN, BSN, Kim RN, BSN Entered By: Elliot GurneyWoody, RN, BSN, Kim on 06/16/2016 15:35:44 Mcchesney, Lidia CollumIRMA Gordon. ([id number]) -------------------------------------------------------------------------------- Wound Assessment Details Patient Name: [name]. 06/16/2016 2:45 Date of Service: PM Medical Record [id number] Number: Patient Account Number: [id number] 15-Oct-1929 (82 y.o. Treating RN: Huel CoventryWoody, Kim Date of Birth/Sex: Female) Other Clinician: Primary Care Physician: Etheleen NicksMACDONALD, KERI Treating Britto, Errol Referring Physician: Etheleen NicksMACDONALD, KERI Physician/Extender: Tania AdeWeeks in Treatment: 0 Wound Status Wound Number: 1 Primary Trauma, Other Etiology: Wound Location: Left Lower Leg - Lateral, Proximal Secondary Lymphedema Etiology: Wounding Event: Gradually Appeared Wound Open Date Acquired: 05/14/2016 Status: Weeks Of Treatment: 0 Comorbid Lymphedema, Asthma, Deep Vein Clustered Wound: No History:  Thrombosis, Received Chemotherapy, Received Radiation Wound Measurements Length: (cm) 0.4 % Reduction in Ar Width: (cm) 3 % Reduction in Vo Depth: (cm) 0.1 Area: (cm) 0.942 Volume: (cm) 0.094 ea: 0% lume: 0% Wound Description Classification: Partial Thickness Wound Margin: Flat and Intact Exudate Amount: Small Exudate Type: Serosanguineous Exudate Color: red, brown Wound Bed Granulation Amount: Medium (34-66%) Exposed Structure Granulation Quality: Red Fascia Exposed: No Necrotic Amount: Medium (34-66%) Fat Layer Exposed: No Necrotic Quality: Eschar Tendon Exposed: No Muscle Exposed: No Joint Exposed: No Bone Exposed: No Limited to Skin Breakdown Periwound Skin Texture Texture Color Leugers, Thalya Gordon. ([id number]) No Abnormalities Noted: No No Abnormalities Noted: No Callus: No Atrophie Blanche: No Crepitus: No Cyanosis: No Excoriation: Yes Ecchymosis: Yes Fluctuance: No Erythema: No Friable: No Hemosiderin Staining: No Induration: No Mottled: No Localized Edema: No Pallor: No Rash: No Rubor: No Scarring: No Moisture No Abnormalities Noted: No Dry / Scaly: No Maceration: No Moist: Yes Wound Preparation Ulcer Cleansing: Rinsed/Irrigated with Saline Topical Anesthetic Applied: None Treatment Notes Wound #1 (Left, Proximal, Lateral Lower Leg) 1. Cleansed with: Cleanse wound with antibacterial soap and water 2. Anesthetic Topical Lidocaine 4% cream to wound bed prior to debridement 4. Dressing Applied: Aquacel Ag 5. Secondary Dressing Applied ABD Pad 7. Secured with 3 Layer Compression System - Left Lower Extremity Electronic Signature(s) Signed: 06/16/2016 6:04:04 PM By: Elliot GurneyWoody, RN, BSN, Kim RN, BSN Entered By: Elliot GurneyWoody, RN, BSN, Kim on 06/16/2016 15:33:48 Durkee, Lidia CollumIRMA Gordon. ([id number]) -------------------------------------------------------------------------------- Wound Assessment Details Patient Name: [name]. 06/16/2016 2:45 Date of  Service: PM Medical Record [id number] Number: Patient Account Number: [id number] 15-Oct-1929 (82 y.o. Treating RN: Huel CoventryWoody, Kim Date of Birth/Sex: Female) Other Clinician: Primary Care Physician: Etheleen NicksMACDONALD, KERI Treating Britto, Errol Referring Physician: Etheleen NicksMACDONALD, KERI Physician/Extender: Tania AdeWeeks in Treatment: 0 Wound Status Wound Number: 2 Primary Etiology: Lymphedema Wound Location: Left, Distal, Lateral Lower Leg Secondary Etiology: Venous Leg Ulcer Wounding Event: Gradually Appeared Wound Status: Open Date Acquired: 05/14/2016 Weeks Of Treatment: 0 Clustered Wound: No Wound Measurements Length: (cm) 1.5 Width: (cm) 1.8 Depth: (cm) 0.2 Area: (cm) 2.121 Volume: (cm) 0.424 % Reduction in Area: 0% % Reduction in Volume: 0% Wound Description Full Thickness Without Exposed Classification: Support Structures Periwound Skin Texture Texture Color No Abnormalities Noted: No No Abnormalities Noted: No Moisture No Abnormalities Noted: No Treatment Notes Wound #2 (Left, Distal, Lateral Lower Leg) 1. Cleansed with: Cleanse wound with antibacterial soap and water 2. Anesthetic Topical Lidocaine 4% cream to wound bed prior to debridement 4. Dressing Applied: Aquacel Ag 5. Secondary Dressing Applied ABD Pad 7. Secured with [place] Shela CommonsJ. ([id number]) 3 Layer Compression System - Left Lower Extremity Electronic Signature(s) Signed: 06/16/2016 6:04:04 PM By: Elliot GurneyWoody, RN, BSN, Kim RN, BSN Entered By: Elliot GurneyWoody, RN,  BSN, Kim on 06/16/2016 15:33:39

## 2016-06-23 ENCOUNTER — Encounter: Payer: Medicare Other | Admitting: Surgery

## 2016-06-23 DIAGNOSIS — L97222 Non-pressure chronic ulcer of left calf with fat layer exposed: Secondary | ICD-10-CM | POA: Diagnosis not present

## 2016-06-24 NOTE — Progress Notes (Signed)
[name], [name] J. ([id number]) Visit Report for 06/23/2016 Arrival Gordon Details Patient Name: [name] Date of Service: 06/23/2016 10:45 AM Medical Record Number: [id number] Patient Account Number: [id number] Date of Birth/Sex: 05-20-1930 (82 y.o. Female) Treating RN: Huel CoventryWoody, Kim Primary Care Physician: Etheleen NicksMACDONALD, KERI Other Clinician: Referring Physician: Etheleen NicksMACDONALD, KERI Treating Physician/Extender: Rudene ReBritto, [name] History Since Last Visit Added or deleted any medications: No Patient Arrived: Ambulatory Any new allergies or adverse reactions: No Arrival Time: 10:52 Had a fall or experienced change in No Accompanied By: self activities of daily living that may affect Transfer Assistance: None risk of falls: Patient Identification Verified: Yes Signs or symptoms of abuse/neglect since last No Secondary Verification Process Yes visito Completed: Has Dressing in Place as Prescribed: Yes Patient Requires Transmission- No Has Compression in Place as Prescribed: Yes Based Precautions: Pain Present Now: No Patient Has Alerts: Yes Patient Alerts: Patient on Blood Thinner Warfarin Electronic Signature(s) Signed: 06/23/2016 5:05:40 PM By: Elliot GurneyWoody, RN, BSN, Kim RN, BSN Entered By: Elliot GurneyWoody, RN, BSN, Kim on 06/23/2016 10:52:22 [name], [name] ([id number]) -------------------------------------------------------------------------------- Encounter Discharge Gordon Details Patient Name: [name], [name] Date of Service: 06/23/2016 10:45 AM Medical Record Number: [id number] Patient Account Number: [id number] Date of Birth/Sex: 05-20-1930 (82 y.o. Female) Treating RN: Huel CoventryWoody, Kim Primary Care Physician: Etheleen NicksMACDONALD, KERI Other Clinician: Referring Physician: Etheleen NicksMACDONALD, KERI Treating Physician/Extender: Rudene ReBritto, [name] Weeks in Treatment: 1 Encounter Discharge Gordon Items Discharge Pain Level: 0 Discharge Condition: Stable Ambulatory Status:  Cane Discharge Destination: Home Private Transportation: Auto Accompanied By: self Schedule Follow-up Appointment: Yes Medication Reconciliation completed and Yes provided to Patient/Care Jaskirat Zertuche: Patient Clinical Summary of Care: Declined Electronic Signature(s) Signed: 06/23/2016 5:05:40 PM By: Elliot GurneyWoody, RN, BSN, Kim RN, BSN Previous Signature: 06/23/2016 11:25:06 AM Version By: Gwenlyn PerkingMoore, Shelia Entered By: Elliot GurneyWoody, RN, BSN, Kim on 06/23/2016 11:31:20 [name], [name] ([id number]) -------------------------------------------------------------------------------- Lower Extremity Assessment Details Patient Name: [name] [name] Date of Service: 06/23/2016 10:45 AM Medical Record Number: [id number] Patient Account Number: [id number] Date of Birth/Sex: 05-20-1930 (82 y.o. Female) Treating RN: Huel CoventryWoody, Kim Primary Care Physician: Etheleen NicksMACDONALD, KERI Other Clinician: Referring Physician: Etheleen NicksMACDONALD, KERI Treating Physician/Extender: Rudene ReBritto, [name] Weeks in Treatment: 1 Edema Assessment Assessed: [Left: No] [Right: No] E[Left: dema] [Right: :] Calf Left: Right: Point of Measurement: 30 cm From Medial Instep 46.2 cm cm Ankle Left: Right: Point of Measurement: 13 cm From Medial Instep 27 cm cm Vascular Assessment Pulses: Posterior Tibial Popliteal Palpable: [Left:Yes] Extremity colors, hair growth, and conditions: Extremity Color: [Left:Normal] Hair Growth on Extremity: [Left:No] Temperature of Extremity: [Left:Warm] Capillary Refill: [Left:< 3 seconds] Dependent Rubor: [Left:No] Blanched when Elevated: [Left:No] Lipodermatosclerosis: [Left:No] Toe Nail Assessment Left: Right: Thick: Yes Discolored: Yes Deformed: Yes Improper Length and Hygiene: No Electronic Signature(s) Signed: 06/23/2016 5:05:40 PM By: Elliot GurneyWoody, RN, BSN, Kim RN, BSN [address]MITH, [name] J. ([id number]) Entered By: Elliot GurneyWoody, RN, BSN, Kim on 06/23/2016 10:59:53 [name], [name] [name] CommonsJ.  ([id number]) -------------------------------------------------------------------------------- Multi Wound Chart Details Patient Name: [name] [name] Date of Service: 06/23/2016 10:45 AM Medical Record Number: [id number] Patient Account Number: [id number] Date of Birth/Sex: 05-20-1930 (82 y.o. Female) Treating RN: Huel CoventryWoody, Kim Primary Care Physician: Etheleen NicksMACDONALD, KERI Other Clinician: Referring Physician: Etheleen NicksMACDONALD, KERI Treating Physician/Extender: Rudene ReBritto, [name] Weeks in Treatment: 1 Vital Signs Height(in): 66 Pulse(bpm): 81 Weight(lbs): 221 Blood Pressure 114/96 (mmHg): Body Mass Index(BMI): 36 Temperature(F): 97.5 Respiratory Rate 16 (breaths/min): Photos: [N/A:N/A] Wound Location: Left Lower Leg - Lateral, Left Lower Leg - Lateral, N/A Proximal Distal Wounding Event: Gradually Appeared Gradually Appeared N/A  Primary Etiology: Trauma, Other Lymphedema N/A Secondary Etiology: Lymphedema Venous Leg Ulcer N/A Comorbid History: Lymphedema, Asthma, Lymphedema, Asthma, N/A Deep Vein Thrombosis, Deep Vein Thrombosis, Received Chemotherapy, Received Chemotherapy, Received Radiation Received Radiation Date Acquired: 05/14/2016 05/14/2016 N/A Weeks of Treatment: 1 1 N/A Wound Status: Open Open N/A Measurements L x W x D 1x2.2x0.1 1x1.2x0.2 N/A (cm) Area (cm) : 1.728 0.942 N/A Volume (cm) : 0.173 0.188 N/A % Reduction in Area: -83.40% 55.60% N/A % Reduction in Volume: -84.00% 55.70% N/A Classification: Full Thickness Without Full Thickness Without N/A Exposed Support Exposed Support Structures Structures Exudate Amount: Small Medium N/A Exudate Type: Serosanguineous Serosanguineous N/A Exudate Color: red, brown red, brown N/A Gordon, [name] J. ([id number]) Wound Margin: Flat and Intact Flat and Intact N/A Granulation Amount: Medium (34-66%) Small (1-33%) N/A Granulation Quality: Red Pink N/A Necrotic Amount: Medium (34-66%) Large (67-100%) N/A Exposed Structures: Fat: Yes Fat:  Yes N/A Fascia: No Fascia: No Tendon: No Tendon: No Muscle: No Muscle: No Joint: No Joint: No Bone: No Bone: No Epithelialization: None None N/A Periwound Skin Texture: Excoriation: Yes Induration: Yes N/A Edema: No Induration: No Callus: No Crepitus: No Fluctuance: No Friable: No Rash: No Scarring: No Periwound Skin Moist: Yes Moist: Yes N/A Moisture: Maceration: No Dry/Scaly: No Periwound Skin Color: Ecchymosis: Yes Ecchymosis: Yes N/A Atrophie Blanche: No Cyanosis: No Erythema: No Hemosiderin Staining: No Mottled: No Pallor: No Rubor: No Tenderness on No No N/A Palpation: Wound Preparation: Ulcer Cleansing: Ulcer Cleansing: N/A Rinsed/Irrigated with Rinsed/Irrigated with Saline Saline Topical Anesthetic Topical Anesthetic Applied: Other: lidocaine Applied: Other: lidocaine 4% 4% Treatment Notes Electronic Signature(s) Signed: 06/23/2016 5:05:40 PM By: Elliot Gurney, RN, BSN, Kim RN, BSN Entered By: Elliot Gurney, RN, BSN, Kim on 06/23/2016 11:08:05 Lal, [name] ([id number]) -------------------------------------------------------------------------------- Multi-Disciplinary Care Plan Details Patient Name: [place], [name] Date of Service: 06/23/2016 10:45 AM Medical Record Number: [id number] Patient Account Number: [id number] Date of Birth/Sex: 1929/08/02 (82 y.o. Female) Treating RN: Huel Coventry Primary Care Physician: Etheleen Nicks Other Clinician: Referring Physician: Etheleen Nicks Treating Physician/Extender: Rudene Re in Treatment: 1 Active Inactive Abuse / Safety / Falls / Self Care Management Nursing Diagnoses: Impaired physical mobility Potential for falls Goals: Patient will remain injury free Date Initiated: 06/16/2016 Goal Status: Active Interventions: Assess fall risk on admission and as needed Assess self care needs on admission and as needed Notes: Orientation to the Wound Care Program Nursing Diagnoses: Knowledge deficit related to  the wound healing center program Goals: Patient/caregiver will verbalize understanding of the Wound Healing Center Program Date Initiated: 06/16/2016 Goal Status: Active Interventions: Provide education on orientation to the wound center Notes: Venous Leg Ulcer Nursing Diagnoses: Potential for venous Insuffiency (use before diagnosis confirmed) GoalsJENAE, TOMASELLO. ([id number]) Non-invasive venous studies are completed as ordered Date Initiated: 06/16/2016 Goal Status: Active Interventions: Assess peripheral edema status every visit. Compression as ordered Treatment Activities: Non-invasive vascular studies : 06/13/2016 Therapeutic compression applied : 06/13/2016 Notes: Wound/Skin Impairment Nursing Diagnoses: Impaired tissue integrity Goals: Patient/caregiver will verbalize understanding of skin care regimen Date Initiated: 06/16/2016 Goal Status: Active Ulcer/skin breakdown will heal within 14 weeks Date Initiated: 06/16/2016 Goal Status: Active Interventions: Assess patient/caregiver ability to obtain necessary supplies Assess ulceration(s) every visit Notes: Electronic Signature(s) Signed: 06/23/2016 5:05:40 PM By: Elliot Gurney, RN, BSN, Kim RN, BSN Entered By: Elliot Gurney, RN, BSN, Kim on 06/23/2016 11:05:18 [name], [name] ([id number]) -------------------------------------------------------------------------------- Pain Assessment Details Patient Name: [place], [name]. Date of Service: 06/23/2016 10:45 AM Medical Record Number: [id number] Patient Account Number: [id number]  Date of Birth/Sex: 04/03/1930 (82 y.o. Female) Treating RN: Huel Coventry Primary Care Physician: Etheleen Nicks Other Clinician: Referring Physician: Etheleen Nicks Treating Physician/Extender: Rudene Re in Treatment: 1 Active Problems Location of Pain Severity and Description of Pain Patient Has Paino No Site Locations With Dressing Change: No Pain Management and Medication Current Pain  Management: Electronic Signature(s) Signed: 06/23/2016 5:05:40 PM By: Elliot Gurney, RN, BSN, Kim RN, BSN Entered By: Elliot Gurney, RN, BSN, Kim on 06/23/2016 10:52:41 Manger, [name] ([id number]) -------------------------------------------------------------------------------- Patient/Caregiver Education Details Patient Name: [name] Coombs. Date of Service: 06/23/2016 10:45 AM Medical Record Number: [id number] Patient Account Number: [id number] Date of Birth/Gender: 11-11-1929 (82 y.o. Female) Treating RN: Huel Coventry Primary Care Physician: Etheleen Nicks Other Clinician: Referring Physician: Etheleen Nicks Treating Physician/Extender: Rudene Re in Treatment: 1 Education Assessment Education Provided To: Caregiver Education Topics Provided Venous: Handouts: Controlling Swelling with Multilayered Compression Wraps Methods: Demonstration Responses: State content correctly Wound/Skin Impairment: Electronic Signature(s) Signed: 06/23/2016 5:05:40 PM By: Elliot Gurney, RN, BSN, Kim RN, BSN Entered By: Elliot Gurney, RN, BSN, Kim on 06/23/2016 11:31:44 Lefevre, [name] ([id number]) -------------------------------------------------------------------------------- Wound Assessment Details Patient Name: [place], [name]. Date of Service: 06/23/2016 10:45 AM Medical Record Number: [id number] Patient Account Number: [id number] Date of Birth/Sex: 1930/06/24 (82 y.o. Female) Treating RN: Huel Coventry Primary Care Physician: Etheleen Nicks Other Clinician: Referring Physician: Etheleen Nicks Treating Physician/Extender: Rudene Re in Treatment: 1 Wound Status Wound Number: 1 Primary Trauma, Other Etiology: Wound Location: Left Lower Leg - Lateral, Proximal Secondary Lymphedema Etiology: Wounding Event: Gradually Appeared Wound Open Date Acquired: 05/14/2016 Status: Weeks Of Treatment: 1 Comorbid Lymphedema, Asthma, Deep Vein Clustered Wound: No History: Thrombosis, Received Chemotherapy, Received  Radiation Photos Wound Measurements Length: (cm) 1 Width: (cm) 2.2 Depth: (cm) 0.1 Area: (cm) 1.728 Volume: (cm) 0.173 % Reduction in Area: -83.4% % Reduction in Volume: -84% Epithelialization: None Tunneling: No Undermining: No Wound Description Full Thickness Without Exposed Classification: Support Structures Wound Margin: Flat and Intact Exudate Small Amount: Exudate Type: Serosanguineous Exudate Color: red, brown Wound Bed Granulation Amount: Medium (34-66%) Exposed Structure Granulation Quality: Red Fascia Exposed: No Necrotic Amount: Medium (34-66%) Fat Layer Exposed: Yes Gordon, [name] J. ([id number]) Necrotic Quality: Adherent Slough Tendon Exposed: No Muscle Exposed: No Joint Exposed: No Bone Exposed: No Periwound Skin Texture Texture Color No Abnormalities Noted: No No Abnormalities Noted: No Callus: No Atrophie Blanche: No Crepitus: No Cyanosis: No Excoriation: Yes Ecchymosis: Yes Fluctuance: No Erythema: No Friable: No Hemosiderin Staining: No Induration: No Mottled: No Localized Edema: No Pallor: No Rash: No Rubor: No Scarring: No Moisture No Abnormalities Noted: No Dry / Scaly: No Maceration: No Moist: Yes Wound Preparation Ulcer Cleansing: Rinsed/Irrigated with Saline Topical Anesthetic Applied: Other: lidocaine 4%, Treatment Notes Wound #1 (Left, Proximal, Lateral Lower Leg) 1. Cleansed with: Cleanse wound with antibacterial soap and water 2. Anesthetic Topical Lidocaine 4% cream to wound bed prior to debridement 4. Dressing Applied: Aquacel Ag 5. Secondary Dressing Applied ABD Pad 7. Secured with 3 Layer Compression System - Left Lower Extremity Electronic Signature(s) Signed: 06/23/2016 5:05:40 PM By: Elliot Gurney, RN, BSN, Kim RN, BSN Entered By: Elliot Gurney, RN, BSN, Kim on 06/23/2016 11:03:40 [place], [name] ([id number]) -------------------------------------------------------------------------------- Wound Assessment  Details Patient Name: [place], [name] Date of Service: 06/23/2016 10:45 AM Medical Record Number: [id number] Patient Account Number: [id number] Date of Birth/Sex: 1930/06/11 (82 y.o. Female) Treating RN: Huel Coventry Primary Care Physician: Etheleen Nicks Other Clinician: Referring Physician: Etheleen Nicks Treating Physician/Extender: Rudene Re in Treatment: 1 Wound  Status Wound Number: 2 Primary Lymphedema Etiology: Wound Location: Left Lower Leg - Lateral, Distal Secondary Venous Leg Ulcer Wounding Event: Gradually Appeared Etiology: Date Acquired: 05/14/2016 Wound Open Weeks Of Treatment: 1 Status: Clustered Wound: No Comorbid Lymphedema, Asthma, Deep Vein History: Thrombosis, Received Chemotherapy, Received Radiation Photos Wound Measurements Length: (cm) 1 Width: (cm) 1.2 Depth: (cm) 0.2 Area: (cm) 0.942 Volume: (cm) 0.188 % Reduction in Area: 55.6% % Reduction in Volume: 55.7% Epithelialization: None Tunneling: No Undermining: No Wound Description Full Thickness Without Exposed Classification: Support Structures Wound Margin: Flat and Intact Exudate Medium Amount: Exudate Type: Serosanguineous Exudate Color: red, brown Wound Bed Granulation Amount: Small (1-33%) Exposed Structure Granulation Quality: Pink Fascia Exposed: No Necrotic Amount: Large (67-100%) Fat Layer Exposed: Yes Gordon, [name] J. ([id number]) Necrotic Quality: Adherent Slough Tendon Exposed: No Muscle Exposed: No Joint Exposed: No Bone Exposed: No Periwound Skin Texture Texture Color No Abnormalities Noted: No No Abnormalities Noted: No Induration: Yes Ecchymosis: Yes Moisture No Abnormalities Noted: No Moist: Yes Wound Preparation Ulcer Cleansing: Rinsed/Irrigated with Saline Topical Anesthetic Applied: Other: lidocaine 4%, Treatment Notes Wound #2 (Left, Distal, Lateral Lower Leg) 1. Cleansed with: Cleanse wound with antibacterial soap and water 2.  Anesthetic Topical Lidocaine 4% cream to wound bed prior to debridement 4. Dressing Applied: Aquacel Ag 5. Secondary Dressing Applied ABD Pad 7. Secured with 3 Layer Compression System - Left Lower Extremity Electronic Signature(s) Signed: 06/23/2016 5:05:40 PM By: Elliot GurneyWoody, RN, BSN, Kim RN, BSN Entered By: Elliot GurneyWoody, RN, BSN, Kim on 06/23/2016 11:04:59 [place], [name] ([id number]) -------------------------------------------------------------------------------- Vitals Details Patient Name: [name], [name] J. Date of Service: 06/23/2016 10:45 AM Medical Record Number: [id number] Patient Account Number: [id number] Date of Birth/Sex: October 16, 1929 (82 y.o. Female) Treating RN: Huel CoventryWoody, Kim Primary Care Physician: Etheleen NicksMACDONALD, KERI Other Clinician: Referring Physician: Etheleen NicksMACDONALD, KERI Treating Physician/Extender: Rudene ReBritto, [name] Weeks in Treatment: 1 Vital Signs Time Taken: 10:52 Temperature (F): 97.5 Height (in): 66 Pulse (bpm): 81 Source: Stated Respiratory Rate (breaths/min): 16 Weight (lbs): 221 Blood Pressure (mmHg): 114/96 Source: Measured Reference Range: 80 - 120 mg / dl Body Mass Index (BMI): 35.7 Electronic Signature(s) Signed: 06/23/2016 5:05:40 PM By: Elliot GurneyWoody, RN, BSN, Kim RN, BSN Entered By: Elliot GurneyWoody, RN, BSN, Kim on 06/23/2016 10:53:14

## 2016-06-24 NOTE — Progress Notes (Signed)
[name], [name] J. ([id number]) Visit Report for 06/23/2016 Chief Complaint Document Details Patient Name: [name], [name] J. 06/23/2016 10:45 Date of Service: AM Medical Record [id number] Number: Patient Account Number: [id number] May 10, 1930 (82 y.o. Treating RN: Huel CoventryWoody, Kim Date of Birth/Sex: Female) Other Clinician: Primary Care Physician: Etheleen NicksMACDONALD, KERI Treating Evlyn KannerBritto, Jeanmarc Viernes Referring Physician: Etheleen NicksMACDONALD, KERI Physician/Extender: Tania AdeWeeks in Treatment: 1 Information Obtained from: Patient Chief Complaint Patient presents to the wound care center for a consult due non healing wound to the left lower extremity which she's had for about a month but she's had bilateral lower extremity swelling for about 15 years Electronic Signature(s) Signed: 06/23/2016 11:38:04 AM By: Evlyn KannerBritto, Sadye Kiernan MD, FACS Entered By: Evlyn KannerBritto, Daveah Varone on 06/23/2016 11:38:04 Aldana, Weslynn Shela CommonsJ. ([id number]) -------------------------------------------------------------------------------- Debridement Details Patient Name: [name], [name] J. 06/23/2016 10:45 Date of Service: AM Medical Record [id number] Number: Patient Account Number: [id number] May 10, 1930 (82 y.o. Treating RN: Huel CoventryWoody, Kim Date of Birth/Sex: Female) Other Clinician: Primary Care Physician: Etheleen NicksMACDONALD, KERI Treating Evlyn KannerBritto, Woodrow Dulski Referring Physician: Etheleen NicksMACDONALD, KERI Physician/Extender: Tania AdeWeeks in Treatment: 1 Debridement Performed for Wound #1 Left,Proximal,Lateral Lower Leg Assessment: Performed By: Physician Evlyn KannerBritto, Mainor Hellmann, MD Debridement: Debridement Pre-procedure Yes - 11:08 Verification/Time Out Taken: Start Time: 11:09 Pain Control: Other : lidocaine 4% Level: Skin/Subcutaneous Tissue Total Area Debrided (L x 1 (cm) x 2.2 (cm) = 2.2 (cm) W): Tissue and other Viable, Non-Viable, Subcutaneous material debrided: Instrument: Curette Bleeding: Moderate Hemostasis Achieved: Pressure End Time: 11:11 Procedural Pain: 2 Post Procedural Pain: 0 Response to  Treatment: Procedure was tolerated well Post Debridement Measurements of Total Wound Length: (cm) 1 Width: (cm) 2.2 Depth: (cm) 0.2 Volume: (cm) 0.346 Character of Wound/Ulcer Post Requires Further Debridement Debridement: Severity of Tissue Post Debridement: Fat layer exposed Post Procedure Diagnosis Same as Pre-procedure Electronic Signature(s) Signed: 06/23/2016 11:37:53 AM By: Evlyn KannerBritto, Vaishnavi Dalby MD, FACS Signed: 06/23/2016 5:05:40 PM By: Elliot GurneyWoody, RN, BSN, Kim RN, BSN Furuya, Madine J. ([id number]) Entered By: Evlyn KannerBritto, Barrie Wale on 06/23/2016 11:37:53 Thilges, Lidia [name] ([id number]) -------------------------------------------------------------------------------- Debridement Details Patient Name: [name], [name] J. 06/23/2016 10:45 Date of Service: AM Medical Record [id number] Number: Patient Account Number: [id number] May 10, 1930 (82 y.o. Treating RN: Huel CoventryWoody, Kim Date of Birth/Sex: Female) Other Clinician: Primary Care Physician: Etheleen NicksMACDONALD, KERI Treating Evlyn KannerBritto, Itzayana Pardy Referring Physician: Etheleen NicksMACDONALD, KERI Physician/Extender: Tania AdeWeeks in Treatment: 1 Debridement Performed for Wound #2 Left,Distal,Lateral Lower Leg Assessment: Performed By: Physician Evlyn KannerBritto, Chekesha Behlke, MD Debridement: Debridement Pre-procedure Yes - 11:08 Verification/Time Out Taken: Start Time: 11:09 Pain Control: Other : lidocaine 4% Level: Skin/Subcutaneous Tissue Total Area Debrided (L x 1 (cm) x 1.2 (cm) = 1.2 (cm) W): Tissue and other Viable, Non-Viable, Fibrin/Slough, Subcutaneous material debrided: Instrument: Curette Bleeding: Moderate Hemostasis Achieved: Pressure End Time: 11:11 Procedural Pain: 2 Post Procedural Pain: 0 Response to Treatment: Procedure was tolerated well Post Debridement Measurements of Total Wound Length: (cm) 1 Width: (cm) 1.2 Depth: (cm) 0.3 Volume: (cm) 0.283 Character of Wound/Ulcer Post Requires Further Debridement Debridement: Severity of Tissue Post Debridement: Fat layer  exposed Post Procedure Diagnosis Same as Pre-procedure Electronic Signature(s) Signed: 06/23/2016 11:37:59 AM By: Evlyn KannerBritto, Izzak Fries MD, FACS Signed: 06/23/2016 5:05:40 PM By: Elliot GurneyWoody, RN, BSN, Kim RN, BSN Cheuvront, Madlyn J. ([id number]) Entered By: Evlyn KannerBritto, Zalika Tieszen on 06/23/2016 11:37:59 Reffner, Akaylah Shela CommonsJ. ([id number]) -------------------------------------------------------------------------------- HPI Details Patient Name: [name], [name] J. 06/23/2016 10:45 Date of Service: AM Medical Record [id number] Number: Patient Account Number: [id number] May 10, 1930 (82 y.o. Treating RN: Huel CoventryWoody, Kim Date of Birth/Sex: Female) Other Clinician: Primary Care Physician: Etheleen NicksMACDONALD, KERI Treating Evlyn KannerBritto, Merritt Mccravy Referring Physician: Etheleen NicksMACDONALD, KERI Physician/Extender: Tania AdeWeeks  in Treatment: 1 History of Present Illness Location: ulcerated area on the lateral part of her left calf Quality: Patient reports experiencing a dull pain to affected area(s). Severity: Patient states wound are getting worse. Duration: Patient has had the wound for < 4 weeks prior to presenting for treatment Timing: Pain in wound is Intermittent (comes and goes Context: The wound appeared gradually over time Modifying Factors: Other treatment(s) tried include:she has been wearing what sounds like lymphedema pumps for the last 12 years or so Associated Signs and Symptoms: Patient reports having increase swelling. HPI Description: 82 year old patient who comes with bilateral lower extremity edema which she's had for at least 15 years and now an ulcerated area on the left lower extremity which she's had for a month. He was recently seen by the PCP who made a diagnosis of venous stasis ulcer of the left calf and referred her to the wound center. He was placed on clindamycin. Past medical history significant for colon cancer, history of DVT, left tibial fracture in 2012 which required surgery, hyperlipidemia and has been on warfarin for several  years. She also gives a history of having lymph pumps and she has been wearing them for about 12 years. She is a former smoker and has quit about 10 years ago. 06/23/2016 -- she has not yet got her venous duplex study done but she did bring her lymphedema pumps and we have checked him out and they seem to be functioning fine Electronic Signature(s) Signed: 06/23/2016 11:38:26 AM By: Evlyn Kanner MD, FACS Entered By: Evlyn Kanner on 06/23/2016 11:38:26 Prestage, [name] ([id number]) -------------------------------------------------------------------------------- Physical Exam Details Patient Name: [name], [name] 06/23/2016 10:45 Date of Service: AM Medical Record [id number] Number: Patient Account Number: [id number] [birth year] (82 y.o. Treating RN: Huel Coventry Date of Birth/Sex: Female) Other Clinician: Primary Care Physician: Etheleen Nicks Treating Evlyn Kanner Referring Physician: Etheleen Nicks Physician/Extender: Tania Ade in Treatment: 1 Constitutional . Pulse regular. Respirations normal and unlabored. Afebrile. . Eyes Nonicteric. Reactive to light. Ears, Nose, Mouth, and Throat Lips, teeth, and gums WNL.Marland Kitchen Moist mucosa without lesions. Neck supple and nontender. No palpable supraclavicular or cervical adenopathy. Normal sized without goiter. Respiratory WNL. No retractions.. Breath sounds WNL, No rubs, rales, rhonchi, or wheeze.. Cardiovascular Heart rhythm and rate regular, no murmur or gallop.. Pedal Pulses WNL. No clubbing, cyanosis or edema. Lymphatic No adneopathy. No adenopathy. No adenopathy. Musculoskeletal Adexa without tenderness or enlargement.. Digits and nails w/o clubbing, cyanosis, infection, petechiae, ischemia, or inflammatory conditions.. Integumentary (Hair, Skin) No suspicious lesions. No crepitus or fluctuance. No peri-wound warmth or erythema. No masses.Marland Kitchen Psychiatric Judgement and insight Intact.. No evidence of depression, anxiety, or  agitation.. Notes the lymphedema continues to be significant and the ulceration on the left lateral leg needed sharp debridement with a #3 curet and bleeding controlled with pressure. Electronic Signature(s) Signed: 06/23/2016 11:38:51 AM By: Evlyn Kanner MD, FACS Entered By: Evlyn Kanner on 06/23/2016 11:38:50 [name] ([id number]) -------------------------------------------------------------------------------- Physician Orders Details Patient Name: [name], [name] 06/23/2016 10:45 Date of Service: AM Medical Record [id number] Number: Patient Account Number: [id number] 09/28/1929 (82 y.o. Treating RN: Huel Coventry Date of Birth/Sex: Female) Other Clinician: Primary Care Physician: Etheleen Nicks Treating Evlyn Kanner Referring Physician: Etheleen Nicks Physician/Extender: Tania Ade in Treatment: 1 Verbal / Phone Orders: Yes Clinician: Huel Coventry Read Back and Verified: Yes Diagnosis Coding Wound Cleansing Wound #1 Left,Proximal,Lateral Lower Leg o Clean wound with Normal Saline. Wound #2 Left,Distal,Lateral Lower Leg o Clean wound with Normal Saline. Anesthetic Wound #  1 Left,Proximal,Lateral Lower Leg o Topical Lidocaine 4% cream applied to wound bed prior to debridement Wound #2 Left,Distal,Lateral Lower Leg o Topical Lidocaine 4% cream applied to wound bed prior to debridement Primary Wound Dressing Wound #1 Left,Proximal,Lateral Lower Leg o Aquacel Ag Wound #2 Left,Distal,Lateral Lower Leg o Aquacel Ag Secondary Dressing Wound #1 Left,Proximal,Lateral Lower Leg o ABD pad Wound #2 Left,Distal,Lateral Lower Leg o ABD pad Dressing Change Frequency Wound #1 Left,Proximal,Lateral Lower Leg o Change dressing every week o Other: - Tuesday for dressing change, if needed. Wound #2 Left,Distal,Lateral Lower Leg Sumner, Felicita J. ([id number]) o Change dressing every week o Other: - Tuesday for dressing change, if needed. Follow-up  Appointments Wound #1 Left,Proximal,Lateral Lower Leg o Return Appointment in 1 week. o Nurse Visit as needed - For dressing change. Wound #2 Left,Distal,Lateral Lower Leg o Return Appointment in 1 week. o Nurse Visit as needed - For dressing change. Edema Control Wound #1 Left,Proximal,Lateral Lower Leg o 3 Layer Compression System - Left Lower Extremity Wound #2 Left,Distal,Lateral Lower Leg o 3 Layer Compression System - Left Lower Extremity Additional Orders / Instructions Wound #1 Left,Proximal,Lateral Lower Leg o Increase protein intake. o Activity as tolerated Wound #2 Left,Distal,Lateral Lower Leg o Increase protein intake. o Activity as tolerated Electronic Signature(s) Signed: 06/23/2016 4:08:26 PM By: Evlyn Kanner MD, FACS Signed: 06/23/2016 5:05:40 PM By: Elliot Gurney RN, BSN, Kim RN, BSN Entered By: Elliot Gurney, RN, BSN, Kim on 06/23/2016 11:14:31 Klemann, [name] ([id number]) -------------------------------------------------------------------------------- Problem List Details Patient Name: [name], [name] 06/23/2016 10:45 Date of Service: AM Medical Record [id number] Number: Patient Account Number: [id number] 05-May-1930 (82 y.o. Treating RN: Huel Coventry Date of Birth/Sex: Female) Other Clinician: Primary Care Physician: Etheleen Nicks Treating Evlyn Kanner Referring Physician: Etheleen Nicks Physician/Extender: Tania Ade in Treatment: 1 Active Problems ICD-10 Encounter Code Description Active Date Diagnosis I89.0 Lymphedema, not elsewhere classified 06/13/2016 Yes L97.222 Non-pressure chronic ulcer of left calf with fat layer 06/13/2016 Yes exposed I80.203 Phlebitis and thrombophlebitis of unspecified deep 06/13/2016 Yes vessels of lower extremities, bilateral Inactive Problems Resolved Problems Electronic Signature(s) Signed: 06/23/2016 11:37:46 AM By: Evlyn Kanner MD, FACS Entered By: Evlyn Kanner on 06/23/2016 11:37:46 Hagmann, Caelen [name]  ([id number]) -------------------------------------------------------------------------------- Progress Note Details Patient Name: [name] 06/23/2016 10:45 Date of Service: AM Medical Record [id number] Number: Patient Account Number: [id number] 11/17/1929 (82 y.o. Treating RN: Huel Coventry Date of Birth/Sex: Female) Other Clinician: Primary Care Physician: Etheleen Nicks Treating Jahdiel Krol Referring Physician: Etheleen Nicks Physician/Extender: Tania Ade in Treatment: 1 Subjective Chief Complaint Information obtained from Patient Patient presents to the wound care center for a consult due non healing wound to the left lower extremity which she's had for about a month but she's had bilateral lower extremity swelling for about 15 years History of Present Illness (HPI) The following HPI elements were documented for the patient's wound: Location: ulcerated area on the lateral part of her left calf Quality: Patient reports experiencing a dull pain to affected area(s). Severity: Patient states wound are getting worse. Duration: Patient has had the wound for < 4 weeks prior to presenting for treatment Timing: Pain in wound is Intermittent (comes and goes Context: The wound appeared gradually over time Modifying Factors: Other treatment(s) tried include:she has been wearing what sounds like lymphedema pumps for the last 12 years or so Associated Signs and Symptoms: Patient reports having increase swelling. 82 year old patient who comes with bilateral lower extremity edema which she's had for at least 15 years and now an ulcerated area on  the left lower extremity which she's had for a month. He was recently seen by the PCP who made a diagnosis of venous stasis ulcer of the left calf and referred her to the wound center. He was placed on clindamycin. Past medical history significant for colon cancer, history of DVT, left tibial fracture in 2012 which required surgery, hyperlipidemia and  has been on warfarin for several years. She also gives a history of having lymph pumps and she has been wearing them for about 12 years. She is a former smoker and has quit about 10 years ago. 06/23/2016 -- she has not yet got her venous duplex study done but she did bring her lymphedema pumps and we have checked him out and they seem to be functioning fine Objective Constitutional Pulse regular. Respirations normal and unlabored. Afebrile. [place], [name] ([id number]) Vitals Time Taken: 10:52 AM, Height: 66 in, Source: Stated, Weight: 221 lbs, Source: Measured, BMI: 35.7, Temperature: 97.5 F, Pulse: 81 bpm, Respiratory Rate: 16 breaths/min, Blood Pressure: 114/96 mmHg. Eyes Nonicteric. Reactive to light. Ears, Nose, Mouth, and Throat Lips, teeth, and gums WNL.Marland Kitchen Moist mucosa without lesions. Neck supple and nontender. No palpable supraclavicular or cervical adenopathy. Normal sized without goiter. Respiratory WNL. No retractions.. Breath sounds WNL, No rubs, rales, rhonchi, or wheeze.. Cardiovascular Heart rhythm and rate regular, no murmur or gallop.. Pedal Pulses WNL. No clubbing, cyanosis or edema. Lymphatic No adneopathy. No adenopathy. No adenopathy. Musculoskeletal Adexa without tenderness or enlargement.. Digits and nails w/o clubbing, cyanosis, infection, petechiae, ischemia, or inflammatory conditions.Marland Kitchen Psychiatric Judgement and insight Intact.. No evidence of depression, anxiety, or agitation.. General Notes: the lymphedema continues to be significant and the ulceration on the left lateral leg needed sharp debridement with a #3 curet and bleeding controlled with pressure. Integumentary (Hair, Skin) No suspicious lesions. No crepitus or fluctuance. No peri-wound warmth or erythema. No masses.. Wound #1 status is Open. Original cause of wound was Gradually Appeared. The wound is located on the Left,Proximal,Lateral Lower Leg. The wound measures 1cm length x 2.2cm width x  0.1cm depth; 1.728cm^2 area and 0.173cm^3 volume. There is fat exposed. There is no tunneling or undermining noted. There is a small amount of serosanguineous drainage noted. The wound margin is flat and intact. There is medium (34-66%) red granulation within the wound bed. There is a medium (34-66%) amount of necrotic tissue within the wound bed including Adherent Slough. The periwound skin appearance exhibited: Excoriation, Moist, Ecchymosis. The periwound skin appearance did not exhibit: Callus, Crepitus, Fluctuance, Friable, Induration, Localized Edema, Rash, Scarring, Dry/Scaly, Maceration, Atrophie Blanche, Cyanosis, Hemosiderin Staining, Mottled, Pallor, Rubor, Erythema. Wound #2 status is Open. Original cause of wound was Gradually Appeared. The wound is located on the Left,Distal,Lateral Lower Leg. The wound measures 1cm length x 1.2cm width x 0.2cm depth; 0.942cm^2 area and 0.188cm^3 volume. There is fat exposed. There is no tunneling or undermining noted. There is a medium amount of serosanguineous drainage noted. The wound margin is flat and intact. There is small [name], Johnae J. ([id number]) (1-33%) pink granulation within the wound bed. There is a large (67-100%) amount of necrotic tissue within the wound bed including Adherent Slough. The periwound skin appearance exhibited: Induration, Moist, Ecchymosis. Assessment Active Problems ICD-10 I89.0 - Lymphedema, not elsewhere classified L97.222 - Non-pressure chronic ulcer of left calf with fat layer exposed I80.203 - Phlebitis and thrombophlebitis of unspecified deep vessels of lower extremities, bilateral Procedures Wound #1 Wound #1 is a Trauma, Other located on the Left,Proximal,Lateral Lower Leg .  There was a Skin/Subcutaneous Tissue Debridement (16109-60454) debridement with total area of 2.2 sq cm performed by Evlyn Kanner, MD. with the following instrument(s): Curette to remove Viable and Non-Viable tissue/material  including Subcutaneous after achieving pain control using Other (lidocaine 4%). A time out was conducted at 11:08, prior to the start of the procedure. A Moderate amount of bleeding was controlled with Pressure. The procedure was tolerated well with a pain level of 2 throughout and a pain level of 0 following the procedure. Post Debridement Measurements: 1cm length x 2.2cm width x 0.2cm depth; 0.346cm^3 volume. Character of Wound/Ulcer Post Debridement requires further debridement. Severity of Tissue Post Debridement is: Fat layer exposed. Post procedure Diagnosis Wound #1: Same as Pre-Procedure Wound #2 Wound #2 is a Lymphedema located on the Left,Distal,Lateral Lower Leg . There was a Skin/Subcutaneous Tissue Debridement ([id number]) debridement with total area of 1.2 sq cm performed by Evlyn Kanner, MD. with the following instrument(s): Curette to remove Viable and Non-Viable tissue/material including Fibrin/Slough and Subcutaneous after achieving pain control using Other (lidocaine 4%). A time out was conducted at 11:08, prior to the start of the procedure. A Moderate amount of bleeding was controlled with Pressure. The procedure was tolerated well with a pain level of 2 throughout and a pain level of 0 following the procedure. Post Debridement Measurements: 1cm length x 1.2cm width x 0.3cm depth; 0.283cm^3 volume. Character of Wound/Ulcer Post Debridement requires further debridement. Severity of Tissue Post Debridement is: Fat layer exposed. Post procedure Diagnosis Wound #2: Same as Pre-Procedure Blowe, Geneal J. ([id number]) Plan Wound Cleansing: Wound #1 Left,Proximal,Lateral Lower Leg: Clean wound with Normal Saline. Wound #2 Left,Distal,Lateral Lower Leg: Clean wound with Normal Saline. Anesthetic: Wound #1 Left,Proximal,Lateral Lower Leg: Topical Lidocaine 4% cream applied to wound bed prior to debridement Wound #2 Left,Distal,Lateral Lower Leg: Topical Lidocaine 4% cream  applied to wound bed prior to debridement Primary Wound Dressing: Wound #1 Left,Proximal,Lateral Lower Leg: Aquacel Ag Wound #2 Left,Distal,Lateral Lower Leg: Aquacel Ag Secondary Dressing: Wound #1 Left,Proximal,Lateral Lower Leg: ABD pad Wound #2 Left,Distal,Lateral Lower Leg: ABD pad Dressing Change Frequency: Wound #1 Left,Proximal,Lateral Lower Leg: Change dressing every week Other: - Tuesday for dressing change, if needed. Wound #2 Left,Distal,Lateral Lower Leg: Change dressing every week Other: - Tuesday for dressing change, if needed. Follow-up Appointments: Wound #1 Left,Proximal,Lateral Lower Leg: Return Appointment in 1 week. Nurse Visit as needed - For dressing change. Wound #2 Left,Distal,Lateral Lower Leg: Return Appointment in 1 week. Nurse Visit as needed - For dressing change. Edema Control: Wound #1 Left,Proximal,Lateral Lower Leg: 3 Layer Compression System - Left Lower Extremity Wound #2 Left,Distal,Lateral Lower Leg: 3 Layer Compression System - Left Lower Extremity Additional Orders / Instructions: Wound #1 Left,Proximal,Lateral Lower Leg: Increase protein intake. Gwynn, Ladeidra J. ([id number]) Activity as tolerated Wound #2 Left,Distal,Lateral Lower Leg: Increase protein intake. Activity as tolerated This 82 year old patient has had a history of long-standing lymphedema and seems to be using some sort of lymph pumps over the last several years. he has checked them out and seemed to be functioning well and I have asked her to use these twice a day for an hour each. At this stage I have recommended: 1. Silver alginate and a Profore lite to be applied and changed at weekly intervals 2. Venous reflux study both lower extremities -- appointments still pending 3. Discussed elevation and exercise 4. Regular visits the wound center Electronic Signature(s) Signed: 06/23/2016 11:40:19 AM By: Evlyn Kanner MD, FACS Entered By: Evlyn Kanner on  06/23/2016  11:40:18 Magwood, Lidia [name] ([id number]) -------------------------------------------------------------------------------- SuperBill Details Patient Name: [name], Syncere J. Date of Service: 06/23/2016 Medical Record Number: [id number] Patient Account Number: [id number] Date of Birth/Sex: Jul 31, 1929 (82 y.o. Female) Treating RN: Huel CoventryWoody, Kim Primary Care Physician: Etheleen NicksMACDONALD, KERI Other Clinician: Referring Physician: Etheleen NicksMACDONALD, KERI Treating Physician/Extender: Rudene ReBritto, Emauri Krygier Weeks in Treatment: 1 Diagnosis Coding ICD-10 Codes Code Description I89.0 Lymphedema, not elsewhere classified L97.222 Non-pressure chronic ulcer of left calf with fat layer exposed I80.203 Phlebitis and thrombophlebitis of unspecified deep vessels of lower extremities, bilateral Facility Procedures CPT4: Description Modifier Quantity Code [id number] 11042 - DEB SUBQ TISSUE 20 SQ CM/< 1 ICD-10 Description Diagnosis I89.0 Lymphedema, not elsewhere classified L97.222 Non-pressure chronic ulcer of left calf with fat layer exposed I80.203 Phlebitis and  thrombophlebitis of unspecified deep vessels of lower extremities, bilateral Physician Procedures CPT4: Description Modifier Quantity Code [id number] 11042 - WC PHYS SUBQ TISS 20 SQ CM 1 ICD-10 Description Diagnosis I89.0 Lymphedema, not elsewhere classified L97.222 Non-pressure chronic ulcer of left calf with fat layer exposed I80.203 Phlebitis and  thrombophlebitis of unspecified deep vessels of lower extremities, bilateral Electronic Signature(s) Signed: 06/23/2016 11:40:27 AM By: Evlyn KannerBritto, Cecilia Vancleve MD, FACS Entered By: Evlyn KannerBritto, Sayan Aldava on 06/23/2016 11:40:26

## 2016-07-03 ENCOUNTER — Encounter: Payer: Medicare Other | Admitting: Surgery

## 2016-07-03 DIAGNOSIS — L97222 Non-pressure chronic ulcer of left calf with fat layer exposed: Secondary | ICD-10-CM | POA: Diagnosis not present

## 2016-07-04 NOTE — Progress Notes (Signed)
[name], [name] J. ([id number]) Visit Report for 07/03/2016 Arrival Information Details Patient Name: [name], [name] HampshireIRMA J. Date of Service: 07/03/2016 10:00 AM Medical Record Number: [id number] Patient Account Number: [id number] Date of Birth/Sex: Mar 28, 1930 (82 y.o. Female) Treating RN: Afful, RN, BSN, [place] Sinkita Primary Care Physician: Etheleen NicksMACDONALD, KERI Other Clinician: Referring Physician: Etheleen NicksMACDONALD, KERI Treating Physician/Extender: Rudene ReBritto, Errol Weeks in Treatment: 2 Visit Information History Since Last Visit All ordered tests and consults were completed: No Patient Arrived: Ambulatory Added or deleted any medications: No Arrival Time: 10:11 Any [name] allergies or adverse reactions: No Accompanied By: self Had a fall or experienced change in No Transfer Assistance: None activities of daily living that may affect Patient Identification Verified: Yes risk of falls: Secondary Verification Process Yes Signs or symptoms of abuse/neglect since last No Completed: visito Patient Requires Transmission- No Hospitalized since last visit: No Based Precautions: Has Dressing in Place as Prescribed: Yes Patient Has Alerts: Yes Has Compression in Place as Prescribed: Yes Patient Alerts: Patient on Blood Pain Present Now: No Thinner Warfarin Electronic Signature(s) Signed: 07/03/2016 4:00:14 PM By: Elpidio EricAfful, Rita BSN, RN Entered By: Elpidio EricAfful, Rita on 07/03/2016 10:12:50 [name], [name] Shela CommonsJ. ([id number]) -------------------------------------------------------------------------------- Encounter Discharge Information Details Patient Name: [place], [name] Date of Service: 07/03/2016 10:00 AM Medical Record Number: [id number] Patient Account Number: [id number] Date of Birth/Sex: Mar 28, 1930 (82 y.o. Female) Treating RN: Clover MealyAfful, RN, BSN, [place] Sinkita Primary Care Physician: Etheleen NicksMACDONALD, KERI Other Clinician: Referring Physician: Etheleen NicksMACDONALD, KERI Treating Physician/Extender: Rudene ReBritto, Errol Weeks in Treatment: 2 Encounter Discharge  Information Items Discharge Pain Level: 0 Discharge Condition: Stable Ambulatory Status: Ambulatory Discharge Destination: Home Transportation: Private Auto Accompanied By: self Schedule Follow-up Appointment: No Medication Reconciliation completed and provided to Patient/Care No [name]: Provided on Clinical Summary of Care: 07/03/2016 Form Type Recipient Paper Patient IS Electronic Signature(s) Signed: 07/03/2016 4:00:14 PM By: Elpidio EricAfful, Rita BSN, RN Previous Signature: 07/03/2016 10:42:12 AM Version By: Gwenlyn PerkingMoore, Shelia Entered By: Elpidio EricAfful, Rita on 07/03/2016 10:42:46 [name], [name] J. ([id number]) -------------------------------------------------------------------------------- Lower Extremity Assessment Details Patient Name: [place], [name] Date of Service: 07/03/2016 10:00 AM Medical Record Number: [id number] Patient Account Number: [id number] Date of Birth/Sex: Mar 28, 1930 (82 y.o. Female) Treating RN: Afful, RN, BSN, [place] Sinkita Primary Care Physician: Etheleen NicksMACDONALD, KERI Other Clinician: Referring Physician: Etheleen NicksMACDONALD, KERI Treating Physician/Extender: Rudene ReBritto, Errol Weeks in Treatment: 2 Edema Assessment Assessed: [Left: No] [Right: No] Edema: [Left: Ye] [Right: s] Calf Left: Right: Point of Measurement: 30 cm From Medial Instep 45.5 cm cm Ankle Left: Right: Point of Measurement: 13 cm From Medial Instep 26.5 cm cm Vascular Assessment Claudication: Claudication Assessment [Left:None] Pulses: Dorsalis Pedis Palpable: [Left:Yes] Posterior Tibial Extremity colors, hair growth, and conditions: Extremity Color: [Left:Mottled] Hair Growth on Extremity: [Left:No] Temperature of Extremity: [Left:Warm] Capillary Refill: [Left:< 3 seconds] Toe Nail Assessment Left: Right: Thick: Yes Discolored: No Deformed: No Improper Length and Hygiene: No Electronic Signature(s) Signed: 07/03/2016 4:00:14 PM By: Elpidio EricAfful, Rita BSN, RN Entered By: Elpidio EricAfful, Rita on 07/03/2016 10:19:01 [name], [name] J.  ([id number]) [name], [name] J. ([id number]) -------------------------------------------------------------------------------- Multi Wound Chart Details Patient Name: [place], [name] Date of Service: 07/03/2016 10:00 AM Medical Record Number: [id number] Patient Account Number: [id number] Date of Birth/Sex: Mar 28, 1930 (82 y.o. Female) Treating RN: Clover MealyAfful, RN, BSN, [place] Sinkita Primary Care Physician: Etheleen NicksMACDONALD, KERI Other Clinician: Referring Physician: Etheleen NicksMACDONALD, KERI Treating Physician/Extender: Rudene ReBritto, Errol Weeks in Treatment: 2 Vital Signs Height(in): 66 Pulse(bpm): 81 Weight(lbs): 221 Blood Pressure 143/61 (mmHg): Body Mass Index(BMI): 36 Temperature(F): 97.6 Respiratory Rate 16 (breaths/min): Photos: [1:No Photos] [2:No Photos] [N/A:N/A] Wound Location: [1:Left Lower Leg -  Lateral, Proximal] [2:Left Lower Leg - Lateral, Distal] [N/A:N/A] Wounding Event: [1:Gradually Appeared] [2:Gradually Appeared] [N/A:N/A] Primary Etiology: [1:Trauma, Other] [2:Lymphedema] [N/A:N/A] Secondary Etiology: [1:Lymphedema] [2:Venous Leg Ulcer] [N/A:N/A] Comorbid History: [1:Lymphedema, Asthma, Deep Vein Thrombosis, Received Chemotherapy, Received Radiation] [2:Lymphedema, Asthma, Deep Vein Thrombosis, Received Chemotherapy, Received Radiation] [N/A:N/A] Date Acquired: [1:05/14/2016] [2:05/14/2016] [N/A:N/A] Weeks of Treatment: [1:2] [2:2] [N/A:N/A] Wound Status: [1:Open] [2:Open] [N/A:N/A] Measurements L x W x D 1x2x0.1 [2:0.6x0.6x0.2] [N/A:N/A] (cm) Area (cm) : [1:1.571] [2:0.283] [N/A:N/A] Volume (cm) : [1:0.157] [2:0.057] [N/A:N/A] % Reduction in Area: [1:-66.80%] [2:86.70%] [N/A:N/A] % Reduction in Volume: -67.00% [2:86.60%] [N/A:N/A] Classification: [1:Full Thickness Without Exposed Support Structures] [2:Full Thickness Without Exposed Support Structures] [N/A:N/A] Exudate Amount: [1:Small] [2:Small] [N/A:N/A] Exudate Type: [1:Serosanguineous] [2:Serosanguineous] [N/A:N/A] Exudate Color: [1:red,  brown] [2:red, brown] [N/A:N/A] Wound Margin: [1:Flat and Intact] [2:Flat and Intact] [N/A:N/A] Granulation Amount: [1:Medium (34-66%)] [2:Medium (34-66%)] [N/A:N/A] Granulation Quality: [1:Red] [2:Pink] [N/A:N/A] Necrotic Amount: [1:Small (1-33%)] [2:Medium (34-66%)] [N/A:N/A] Exposed Structures: [N/A:N/A] Fat: Yes Fat: Yes Fascia: No Fascia: No Tendon: No Tendon: No Muscle: No Muscle: No Joint: No Joint: No Bone: No Bone: No Epithelialization: None Medium (34-66%) N/A Periwound Skin Texture: Excoriation: Yes Induration: Yes N/A Edema: No Induration: No Callus: No Crepitus: No Fluctuance: No Friable: No Rash: No Scarring: No Periwound Skin Moist: Yes Moist: Yes N/A Moisture: Maceration: No Dry/Scaly: No Periwound Skin Color: Ecchymosis: Yes Ecchymosis: Yes N/A Atrophie Blanche: No Mottled: Yes Cyanosis: No Erythema: No Hemosiderin Staining: No Mottled: No Pallor: No Rubor: No Temperature: No Abnormality No Abnormality N/A Tenderness on No No N/A Palpation: Wound Preparation: Ulcer Cleansing: Ulcer Cleansing: N/A Rinsed/Irrigated with Rinsed/Irrigated with Saline, Other: surg scrub Saline, Other: surg scrub and water and water Topical Anesthetic Topical Anesthetic Applied: Other: lidocaine Applied: Other: lidocaine 4% 4% Treatment Notes Wound #1 (Left, Proximal, Lateral Lower Leg) 1. Cleansed with: Cleanse wound with antibacterial soap and water 3. Peri-wound Care: Barrier cream Moisturizing lotion 4. Dressing Applied: Aquacel Ag 5. Secondary Dressing Applied Swartz, Arlin J. ([id number]) ABD Pad 7. Secured with 4-Layer Compression System - Left Lower Extremity Wound #2 (Left, Distal, Lateral Lower Leg) 1. Cleansed with: Cleanse wound with antibacterial soap and water 3. Peri-wound Care: Barrier cream Moisturizing lotion 4. Dressing Applied: Aquacel Ag 5. Secondary Dressing Applied ABD Pad 7. Secured with 4-Layer Compression System - Left  Lower Extremity Electronic Signature(s) Signed: 07/03/2016 10:58:48 AM By: Evlyn Kanner MD, FACS Entered By: Evlyn Kanner on 07/03/2016 10:58:48 [place]ell, Dae Shela Commons ([id number]) -------------------------------------------------------------------------------- Multi-Disciplinary Care Plan Details Patient Name: [place], [name] Hampshire. Date of Service: 07/03/2016 10:00 AM Medical Record Number: [id number] Patient Account Number: [id number] Date of Birth/Sex: [birth year] (82 y.o. Female) Treating RN: Afful, RN, BSN, [place] Sink Primary Care Physician: Etheleen Nicks Other Clinician: Referring Physician: Etheleen Nicks Treating Physician/Extender: Rudene Re in Treatment: 2 Active Inactive Abuse / Safety / Falls / Self Care Management Nursing Diagnoses: Impaired physical mobility Potential for falls Goals: Patient will remain injury free Date Initiated: 06/16/2016 Goal Status: Active Interventions: Assess fall risk on admission and as needed Assess self care needs on admission and as needed Notes: Orientation to the Wound Care Program Nursing Diagnoses: Knowledge deficit related to the wound healing center program Goals: Patient/caregiver will verbalize understanding of the Wound Healing Center Program Date Initiated: 06/16/2016 Goal Status: Active Interventions: Provide education on orientation to the wound center Notes: Venous Leg Ulcer Nursing Diagnoses: Potential for venous Insuffiency (use before diagnosis confirmed) GoalsKILYNN, FITZSIMMONS ([id number]) Non-invasive venous studies are completed as ordered Date Initiated: 06/16/2016  Goal Status: Active Interventions: Assess peripheral edema status every visit. Compression as ordered Treatment Activities: Non-invasive vascular studies : 06/13/2016 Therapeutic compression applied : 06/13/2016 Notes: Wound/Skin Impairment Nursing Diagnoses: Impaired tissue integrity Goals: Patient/caregiver will verbalize understanding of skin  care regimen Date Initiated: 06/16/2016 Goal Status: Active Ulcer/skin breakdown will heal within 14 weeks Date Initiated: 06/16/2016 Goal Status: Active Interventions: Assess patient/caregiver ability to obtain necessary supplies Assess ulceration(s) every visit Notes: Electronic Signature(s) Signed: 07/03/2016 4:00:14 PM By: Elpidio EricAfful, Rita BSN, RN Entered By: Elpidio EricAfful, Rita on 07/03/2016 10:26:55 [name], [name] J. ([id number]) -------------------------------------------------------------------------------- Pain Assessment Details Patient Name: [place], [name] Date of Service: 07/03/2016 10:00 AM Medical Record Number: [id number] Patient Account Number: [id number] Date of Birth/Sex: [birth year] (82 y.o. Female) Treating RN: Clover MealyAfful, RN, BSN, [place] Sinkita Primary Care Physician: Etheleen NicksMACDONALD, KERI Other Clinician: Referring Physician: Etheleen NicksMACDONALD, KERI Treating Physician/Extender: Rudene ReBritto, Errol Weeks in Treatment: 2 Active Problems Location of Pain Severity and Description of Pain Patient Has Paino No Site Locations With Dressing Change: No Pain Management and Medication Current Pain Management: Electronic Signature(s) Signed: 07/03/2016 4:00:14 PM By: Elpidio EricAfful, Rita BSN, RN Entered By: Elpidio EricAfful, Rita on 07/03/2016 10:12:58 [name], [name] Shela CommonsJ. ([id number]) -------------------------------------------------------------------------------- Patient/Caregiver Education Details Patient Name: [place], [name] Date of Service: 07/03/2016 10:00 AM Medical Record Number: [id number] Patient Account Number: [id number] Date of Birth/Gender: [birth year] (82 y.o. Female) Treating RN: Clover MealyAfful, RN, BSN, [place] Sinkita Primary Care Physician: Etheleen NicksMACDONALD, KERI Other Clinician: Referring Physician: Etheleen NicksMACDONALD, KERI Treating Physician/Extender: Rudene ReBritto, Errol Weeks in Treatment: 2 Education Assessment Education Provided To: Patient Education Topics Provided Welcome To The Wound Care Center: Methods: Explain/Verbal Responses: State content  correctly Wound/Skin Impairment: Methods: Explain/Verbal Responses: State content correctly Electronic Signature(s) Signed: 07/03/2016 4:00:14 PM By: Elpidio EricAfful, Rita BSN, RN Entered By: Elpidio EricAfful, Rita on 07/03/2016 10:43:06 [name], [name] J. ([id number]) -------------------------------------------------------------------------------- Wound Assessment Details Patient Name: [place], [name] Date of Service: 07/03/2016 10:00 AM Medical Record Number: [id number] Patient Account Number: [id number] Date of Birth/Sex: [birth year] (82 y.o. Female) Treating RN: Clover MealyAfful, RN, BSN, [place] Sinkita Primary Care Physician: Etheleen NicksMACDONALD, KERI Other Clinician: Referring Physician: Etheleen NicksMACDONALD, KERI Treating Physician/Extender: Rudene ReBritto, Errol Weeks in Treatment: 2 Wound Status Wound Number: 1 Primary Trauma, Other Etiology: Wound Location: Left Lower Leg - Lateral, Proximal Secondary Lymphedema Etiology: Wounding Event: Gradually Appeared Wound Open Date Acquired: 05/14/2016 Status: Weeks Of Treatment: 2 Comorbid Lymphedema, Asthma, Deep Vein Clustered Wound: No History: Thrombosis, Received Chemotherapy, Received Radiation Photos Photo Uploaded By: Elpidio EricAfful, Rita on 07/03/2016 15:57:01 Wound Measurements Length: (cm) 1 Width: (cm) 2 Depth: (cm) 0.1 Area: (cm) 1.571 Volume: (cm) 0.157 % Reduction in Area: -66.8% % Reduction in Volume: -67% Epithelialization: None Tunneling: No Undermining: No Wound Description Full Thickness Without Exposed Classification: Support Structures [place], Chayah J. ([id number]) Wound Margin: Flat and Intact Exudate Small Amount: Exudate Type: Serosanguineous Exudate Color: red, brown Wound Bed Granulation Amount: Medium (34-66%) Exposed Structure Granulation Quality: Red Fascia Exposed: No Necrotic Amount: Small (1-33%) Fat Layer Exposed: Yes Necrotic Quality: Adherent Slough Tendon Exposed: No Muscle Exposed: No Joint Exposed: No Bone Exposed: No Periwound Skin  Texture Texture Color No Abnormalities Noted: No No Abnormalities Noted: No Callus: No Atrophie Blanche: No Crepitus: No Cyanosis: No Excoriation: Yes Ecchymosis: Yes Fluctuance: No Erythema: No Friable: No Hemosiderin Staining: No Induration: No Mottled: No Localized Edema: No Pallor: No Rash: No Rubor: No Scarring: No Temperature / Pain Moisture Temperature: No Abnormality No Abnormalities Noted: No Dry / Scaly: No Maceration: No Moist: Yes Wound Preparation Ulcer Cleansing: Rinsed/Irrigated with Saline, Other: surg scrub and  water, Topical Anesthetic Applied: Other: lidocaine 4%, Treatment Notes Wound #1 (Left, Proximal, Lateral Lower Leg) 1. Cleansed with: Cleanse wound with antibacterial soap and water 3. Peri-wound Care: Barrier cream Moisturizing lotion 4. Dressing Applied: Aquacel Ag [name], [name] J. ([id number]) 5. Secondary Dressing Applied ABD Pad 7. Secured with 4-Layer Compression System - Left Lower Extremity Electronic Signature(s) Signed: 07/03/2016 4:00:14 PM By: Elpidio Eric BSN, RN Entered By: Elpidio Eric on 07/03/2016 10:26:11 [name], [name] ([id number]) -------------------------------------------------------------------------------- Wound Assessment Details Patient Name: [place], [name]. Date of Service: 07/03/2016 10:00 AM Medical Record Number: [id number] Patient Account Number: [id number] Date of Birth/Sex: 04-26-1930 (82 y.o. Female) Treating RN: Clover Mealy, RN, BSN, [place] Sink Primary Care Physician: Etheleen Nicks Other Clinician: Referring Physician: Etheleen Nicks Treating Physician/Extender: Rudene Re in Treatment: 2 Wound Status Wound Number: 2 Primary Lymphedema Etiology: Wound Location: Left Lower Leg - Lateral, Distal Secondary Venous Leg Ulcer Wounding Event: Gradually Appeared Etiology: Date Acquired: 05/14/2016 Wound Open Weeks Of Treatment: 2 Status: Clustered Wound: No Comorbid Lymphedema, Asthma, Deep  Vein History: Thrombosis, Received Chemotherapy, Received Radiation Photos Photo Uploaded By: Elpidio Eric on 07/03/2016 15:57:02 Wound Measurements Length: (cm) 0.6 Width: (cm) 0.6 Depth: (cm) 0.2 Area: (cm) 0.283 Volume: (cm) 0.057 % Reduction in Area: 86.7% % Reduction in Volume: 86.6% Epithelialization: Medium (34-66%) Tunneling: No Undermining: No Wound Description Full Thickness Without Exposed Classification: Support Structures [place], Kathalene J. ([id number]) Wound Margin: Flat and Intact Exudate Small Amount: Exudate Type: Serosanguineous Exudate Color: red, brown Wound Bed Granulation Amount: Medium (34-66%) Exposed Structure Granulation Quality: Pink Fascia Exposed: No Necrotic Amount: Medium (34-66%) Fat Layer Exposed: Yes Necrotic Quality: Adherent Slough Tendon Exposed: No Muscle Exposed: No Joint Exposed: No Bone Exposed: No Periwound Skin Texture Texture Color No Abnormalities Noted: No No Abnormalities Noted: No Induration: Yes Ecchymosis: Yes Mottled: Yes Moisture No Abnormalities Noted: No Temperature / Pain Moist: Yes Temperature: No Abnormality Wound Preparation Ulcer Cleansing: Rinsed/Irrigated with Saline, Other: surg scrub and water, Topical Anesthetic Applied: Other: lidocaine 4%, Treatment Notes Wound #2 (Left, Distal, Lateral Lower Leg) 1. Cleansed with: Cleanse wound with antibacterial soap and water 3. Peri-wound Care: Barrier cream Moisturizing lotion 4. Dressing Applied: Aquacel Ag 5. Secondary Dressing Applied ABD Pad 7. Secured with 4-Layer Compression System - Left Lower Extremity Electronic Signature(s) Signed: 07/03/2016 4:00:14 PM By: Elpidio Eric BSN, RN Entered By: Elpidio Eric on 07/03/2016 10:26:44 Truszkowski, Mari[place] Shela Commons ([id number]) -------------------------------------------------------------------------------- Vitals Details Patient Name: [place], [name]. Date of Service: 07/03/2016 10:00 AM Medical Record Number:  [id number] Patient Account Number: [id number] Date of Birth/Sex: 04-26-1930 (82 y.o. Female) Treating RN: Afful, RN, BSN, Rita Primary Care Physician: Etheleen Nicks Other Clinician: Referring Physician: Etheleen Nicks Treating Physician/Extender: Rudene Re in Treatment: 2 Vital Signs Time Taken: 10:13 Temperature (F): 97.6 Height (in): 66 Pulse (bpm): 81 Weight (lbs): 221 Respiratory Rate (breaths/min): 16 Body Mass Index (BMI): 35.7 Blood Pressure (mmHg): 143/61 Reference Range: 80 - 120 mg / dl Electronic Signature(s) Signed: 07/03/2016 4:00:14 PM By: Elpidio Eric BSN, RN Entered By: Elpidio Eric on 07/03/2016 10:13:19

## 2016-07-04 NOTE — Progress Notes (Signed)
[name], [name] ([id number]) Visit Report for 07/03/2016 Chief Complaint Document Details Patient Name: [name], [name] 07/03/2016 10:00 Date of Service: AM Medical Record [id number] Number: Patient Account Number: [id number] 1929-09-07 (82 y.o. Treating RN: [name] Mealy, RN, BSN, [place] Sink Date of Birth/Sex: Female) Other Clinician: Primary Care Physician: [name] Treating [name] Referring Physician: Etheleen Gordon Physician/Extender: [name] in Treatment: 2 Information Obtained from: Patient Chief Complaint Patient presents to the wound care center for a consult due non healing wound to the left lower extremity which she's had for about a month but she's had bilateral lower extremity swelling for about 15 years Electronic Signature(s) Signed: 07/03/2016 10:58:56 AM By: [name] Kanner MD, FACS Entered By: [name] on 07/03/2016 10:58:55 [name], [name] ([id number]) -------------------------------------------------------------------------------- HPI Details Patient Name: [name]. 07/03/2016 10:00 Date of Service: AM Medical Record [id number] Number: Patient Account Number: [id number] [birth year] (82 y.o. Treating RN: Afful, RN, BSN, [place] Sink Date of Birth/Sex: Female) Other Clinician: Primary Care Physician: [name] Treating [name] Referring Physician: Etheleen Gordon Physician/Extender: [name] in Treatment: 2 History of Present Illness Location: ulcerated area on the lateral part of her left calf Quality: Patient reports experiencing a dull pain to affected area(s). Severity: Patient states wound are getting worse. Duration: Patient has had the wound for < 4 [name] prior to presenting for treatment Timing: Pain in wound is Intermittent (comes and goes Context: The wound appeared gradually over time Modifying Factors: Other treatment(s) tried include:she has been wearing what sounds like lymphedema pumps for the last 12 years or so Associated Signs and  Symptoms: Patient reports having increase swelling. HPI Description: 82 year old patient who comes with bilateral lower extremity edema which she's had for at least 15 years and now an ulcerated area on the left lower extremity which she's had for a month. He was recently seen by the PCP who made a diagnosis of venous stasis ulcer of the left calf and referred her to the wound center. He was placed on clindamycin. Past medical history significant for colon cancer, history of DVT, left tibial fracture in 2012 which required surgery, hyperlipidemia and has been on warfarin for several years. She also gives a history of having lymph pumps and she has been wearing them for about 12 years. She is a former smoker and has quit about 10 years ago. 06/23/2016 -- she has not yet got her venous duplex study done but she did bring her lymphedema pumps and we have checked him out and they seem to be functioning fine 07/03/2016 -- a venous duplex study has not been scheduled for another month and I believe she has an appointment on January 28 Electronic Signature(s) Signed: 07/03/2016 10:59:21 AM By: [name] Kanner MD, FACS Entered By: [name] on 07/03/2016 10:59:21 [name] ([id number]) -------------------------------------------------------------------------------- Physical Exam Details Patient Name: [name], [name] 07/03/2016 10:00 Date of Service: AM Medical Record [id number] Number: Patient Account Number: [id number] 09/05/1929 (82 y.o. Treating RN: [name] Mealy, RN, BSN, [name] Sink Date of Birth/Sex: Female) Other Clinician: Primary Care Physician: [name] Treating [name] Referring Physician: Etheleen Gordon Physician/Extender: [name] in Treatment: 2 Constitutional . Pulse regular. Respirations normal and unlabored. Afebrile. . Eyes Nonicteric. Reactive to light. Ears, Nose, Mouth, and Throat Lips, teeth, and gums WNL.Marland Kitchen Moist mucosa without lesions. Neck supple and nontender.  No palpable supraclavicular or cervical adenopathy. Normal sized without goiter. Respiratory WNL. No retractions.. Breath sounds WNL, No rubs, rales, rhonchi, or wheeze.. Cardiovascular Heart rhythm and rate regular, no murmur or gallop.. Pedal  Pulses WNL. No clubbing, cyanosis or edema. Chest Breasts symmetical and no nipple discharge.. Breast tissue WNL, no masses, lumps, or tenderness.. Lymphatic No adneopathy. No adenopathy. No adenopathy. Musculoskeletal Adexa without tenderness or enlargement.. Digits and nails w/o clubbing, cyanosis, infection, petechiae, ischemia, or inflammatory conditions.. Integumentary (Hair, Skin) No suspicious lesions. No crepitus or fluctuance. No peri-wound warmth or erythema. No masses.Marland Kitchen Psychiatric Judgement and insight Intact.. No evidence of depression, anxiety, or agitation.. Notes the lymphedema still persistent but the wound is looking cleaner and no sharp debridement was required today. Electronic Signature(s) Signed: 07/03/2016 10:59:39 AM By: [name] Kanner MD, FACS Entered By: [name] on 07/03/2016 10:59:38 [name], [name] ([id number]) -------------------------------------------------------------------------------- Physician Orders Details Patient Name: [name], [name] 07/03/2016 10:00 Date of Service: AM Medical Record [id number] Number: Patient Account Number: [id number] April 29, 1930 (82 y.o. Treating RN: [name] Mealy, RN, BSN, [place] Sink Date of Birth/Sex: Female) Other Clinician: Primary Care Physician: [name] Treating [name] Referring Physician: Etheleen Gordon Physician/Extender: [name] in Treatment: 2 Verbal / Phone Orders: Yes Clinician: Afful, RN, BSN, [name] Read Back and Verified: Yes Diagnosis Coding Wound Cleansing Wound #1 Left,Proximal,Lateral Lower Leg o Clean wound with Normal Saline. Wound #2 Left,Distal,Lateral Lower Leg o Clean wound with Normal Saline. Anesthetic Wound #1 Left,Proximal,Lateral Lower  Leg o Topical Lidocaine 4% cream applied to wound bed prior to debridement Wound #2 Left,Distal,Lateral Lower Leg o Topical Lidocaine 4% cream applied to wound bed prior to debridement Primary Wound Dressing Wound #1 Left,Proximal,Lateral Lower Leg o Aquacel Ag Wound #2 Left,Distal,Lateral Lower Leg o Aquacel Ag Secondary Dressing Wound #1 Left,Proximal,Lateral Lower Leg o ABD pad Wound #2 Left,Distal,Lateral Lower Leg o ABD pad Dressing Change Frequency Wound #1 Left,Proximal,Lateral Lower Leg o Change dressing every week o Other: - Tuesday for dressing change, if needed. Wound #2 Left,Distal,Lateral Lower Leg [place], Falon J. ([id number]) o Change dressing every week o Other: - Tuesday for dressing change, if needed. Follow-up Appointments Wound #1 Left,Proximal,Lateral Lower Leg o Return Appointment in 1 week. o Nurse Visit as needed - For dressing change. Wound #2 Left,Distal,Lateral Lower Leg o Return Appointment in 1 week. o Nurse Visit as needed - For dressing change. Edema Control Wound #1 Left,Proximal,Lateral Lower Leg o 4-Layer Compression System - Left Lower Extremity Wound #2 Left,Distal,Lateral Lower Leg o 4-Layer Compression System - Left Lower Extremity Additional Orders / Instructions Wound #1 Left,Proximal,Lateral Lower Leg o Increase protein intake. o Activity as tolerated Wound #2 Left,Distal,Lateral Lower Leg o Increase protein intake. o Activity as tolerated Electronic Signature(s) Signed: 07/03/2016 4:00:14 PM By: Elpidio Eric BSN, RN Signed: 07/03/2016 4:37:15 PM By: [name] Kanner MD, FACS Entered By: Elpidio Eric on 07/03/2016 10:31:00 [name] ([id number]) -------------------------------------------------------------------------------- Problem List Details Patient Name: [name], [name] 07/03/2016 10:00 Date of Service: AM Medical Record [id number] Number: Patient Account Number: [id number] [birth year]  (82 y.o. Treating RN: Afful, RN, BSN,  Sink Date of Birth/Sex: Female) Other Clinician: Primary Care Physician: [name] Treating Marcele Kosta Referring Physician: Etheleen Gordon Physician/Extender: [name] in Treatment: 2 Active Problems ICD-10 Encounter Code Description Active Date Diagnosis I89.0 Lymphedema, not elsewhere classified 06/13/2016 Yes L97.222 Non-pressure chronic ulcer of left calf with fat layer 06/13/2016 Yes exposed I80.203 Phlebitis and thrombophlebitis of unspecified deep 06/13/2016 Yes vessels of lower extremities, bilateral Inactive Problems Resolved Problems Electronic Signature(s) Signed: 07/03/2016 10:58:43 AM By: [name] Kanner MD, FACS Entered By: [name] on 07/03/2016 10:58:43 Dolph, Teyah J. ([id number]) -------------------------------------------------------------------------------- Progress Note Details Patient Name: [name] Moh J. 07/03/2016 10:00 Date of Service:  AM Medical Record [id number] Number: Patient Account Number: [id number] [birth year] (82 y.o. Treating RN: [name] MealyAfful, RN, BSN, [place] Sinkita Date of Birth/Sex: Female) Other Clinician: Primary Care Physician: [name] NicksMACDONALD, KERI Treating [name] Referring Physician: Etheleen NicksMACDONALD, KERI Physician/Extender: [name] AdeWeeks in Treatment: 2 Subjective Chief Complaint Information obtained from Patient Patient presents to the wound care center for a consult due non healing wound to the left lower extremity which she's had for about a month but she's had bilateral lower extremity swelling for about 15 years History of Present Illness (HPI) The following HPI elements were documented for the patient's wound: Location: ulcerated area on the lateral part of her left calf Quality: Patient reports experiencing a dull pain to affected area(s). Severity: Patient states wound are getting worse. Duration: Patient has had the wound for < 4 [name] prior to presenting for treatment Timing: Pain in wound is  Intermittent (comes and goes Context: The wound appeared gradually over time Modifying Factors: Other treatment(s) tried include:she has been wearing what sounds like lymphedema pumps for the last 12 years or so Associated Signs and Symptoms: Patient reports having increase swelling. 82 year old patient who comes with bilateral lower extremity edema which she's had for at least 15 years and now an ulcerated area on the left lower extremity which she's had for a month. He was recently seen by the PCP who made a diagnosis of venous stasis ulcer of the left calf and referred her to the wound center. He was placed on clindamycin. Past medical history significant for colon cancer, history of DVT, left tibial fracture in 2012 which required surgery, hyperlipidemia and has been on warfarin for several years. She also gives a history of having lymph pumps and she has been wearing them for about 12 years. She is a former smoker and has quit about 10 years ago. 06/23/2016 -- she has not yet got her venous duplex study done but she did bring her lymphedema pumps and we have checked him out and they seem to be functioning fine 07/03/2016 -- a venous duplex study has not been scheduled for another month and I believe she has an appointment on January 28 Objective [name], [name] J. ([id number]) Constitutional Pulse regular. Respirations normal and unlabored. Afebrile. Vitals Time Taken: 10:13 AM, Height: 66 in, Weight: 221 lbs, BMI: 35.7, Temperature: 97.6 F, Pulse: 81 bpm, Respiratory Rate: 16 breaths/min, Blood Pressure: 143/61 mmHg. Eyes Nonicteric. Reactive to light. Ears, Nose, Mouth, and Throat Lips, teeth, and gums WNL.Marland Kitchen. Moist mucosa without lesions. Neck supple and nontender. No palpable supraclavicular or cervical adenopathy. Normal sized without goiter. Respiratory WNL. No retractions.. Breath sounds WNL, No rubs, rales, rhonchi, or wheeze.. Cardiovascular Heart rhythm and rate regular, no  murmur or gallop.. Pedal Pulses WNL. No clubbing, cyanosis or edema. Chest Breasts symmetical and no nipple discharge.. Breast tissue WNL, no masses, lumps, or tenderness.. Lymphatic No adneopathy. No adenopathy. No adenopathy. Musculoskeletal Adexa without tenderness or enlargement.. Digits and nails w/o clubbing, cyanosis, infection, petechiae, ischemia, or inflammatory conditions.Marland Kitchen. Psychiatric Judgement and insight Intact.. No evidence of depression, anxiety, or agitation.. General Notes: the lymphedema still persistent but the wound is looking cleaner and no sharp debridement was required today. Integumentary (Hair, Skin) No suspicious lesions. No crepitus or fluctuance. No peri-wound warmth or erythema. No masses.. Wound #1 status is Open. Original cause of wound was Gradually Appeared. The wound is located on the Left,Proximal,Lateral Lower Leg. The wound measures 1cm length x 2cm width x 0.1cm depth; 1.571cm^2 area and 0.157cm^3 volume. There is fat  exposed. There is no tunneling or undermining noted. There is a small amount of serosanguineous drainage noted. The wound margin is flat and intact. There is medium (34-66%) red granulation within the wound bed. There is a small (1-33%) amount of necrotic tissue within the wound bed including Adherent Slough. The periwound skin appearance exhibited: Excoriation, Moist, Ecchymosis. The periwound skin appearance did not exhibit: Callus, Crepitus, Fluctuance, Friable, Induration, Localized Edema, Rash, Scarring, Dry/Scaly, Maceration, Atrophie Blanche, Cyanosis, [place], Jocilynn J. ([id number]) Hemosiderin Staining, Mottled, Pallor, Rubor, Erythema. Periwound temperature was noted as No Abnormality. Wound #2 status is Open. Original cause of wound was Gradually Appeared. The wound is located on the Left,Distal,Lateral Lower Leg. The wound measures 0.6cm length x 0.6cm width x 0.2cm depth; 0.283cm^2 area and 0.057cm^3 volume. There is fat exposed.  There is no tunneling or undermining noted. There is a small amount of serosanguineous drainage noted. The wound margin is flat and intact. There is medium (34-66%) pink granulation within the wound bed. There is a medium (34-66%) amount of necrotic tissue within the wound bed including Adherent Slough. The periwound skin appearance exhibited: Induration, Moist, Ecchymosis, Mottled. Periwound temperature was noted as No Abnormality. Assessment Active Problems ICD-10 I89.0 - Lymphedema, not elsewhere classified L97.222 - Non-pressure chronic ulcer of left calf with fat layer exposed I80.203 - Phlebitis and thrombophlebitis of unspecified deep vessels of lower extremities, bilateral Plan Wound Cleansing: Wound #1 Left,Proximal,Lateral Lower Leg: Clean wound with Normal Saline. Wound #2 Left,Distal,Lateral Lower Leg: Clean wound with Normal Saline. Anesthetic: Wound #1 Left,Proximal,Lateral Lower Leg: Topical Lidocaine 4% cream applied to wound bed prior to debridement Wound #2 Left,Distal,Lateral Lower Leg: Topical Lidocaine 4% cream applied to wound bed prior to debridement Primary Wound Dressing: Wound #1 Left,Proximal,Lateral Lower Leg: Aquacel Ag Wound #2 Left,Distal,Lateral Lower Leg: Aquacel Ag Secondary Dressing: Wound #1 Left,Proximal,Lateral Lower Leg: ABD pad Wound #2 Left,Distal,Lateral Lower Leg: ABD pad [name], [name] J. ([id number]) Dressing Change Frequency: Wound #1 Left,Proximal,Lateral Lower Leg: Change dressing every week Other: - Tuesday for dressing change, if needed. Wound #2 Left,Distal,Lateral Lower Leg: Change dressing every week Other: - Tuesday for dressing change, if needed. Follow-up Appointments: Wound #1 Left,Proximal,Lateral Lower Leg: Return Appointment in 1 week. Nurse Visit as needed - For dressing change. Wound #2 Left,Distal,Lateral Lower Leg: Return Appointment in 1 week. Nurse Visit as needed - For dressing change. Edema Control: Wound  #1 Left,Proximal,Lateral Lower Leg: 4-Layer Compression System - Left Lower Extremity Wound #2 Left,Distal,Lateral Lower Leg: 4-Layer Compression System - Left Lower Extremity Additional Orders / Instructions: Wound #1 Left,Proximal,Lateral Lower Leg: Increase protein intake. Activity as tolerated Wound #2 Left,Distal,Lateral Lower Leg: Increase protein intake. Activity as tolerated At this stage I have recommended: 1. Silver alginate and a Profore 4-layer to be applied and changed at weekly intervals 2. Venous reflux study both lower extremities -- appointments January 28 3. Discussed elevation and exercise 4. Regular visits the wound center Electronic Signature(s) Signed: 07/03/2016 11:00:22 AM By: [name] KannerBritto, Bayley Yarborough MD, FACS Entered By: [name] KannerBritto, Jood Retana on 07/03/2016 11:00:22 [name], [name] ([id number]) -------------------------------------------------------------------------------- SuperBill Details Patient Name: [place], [name] Date of Service: 07/03/2016 Medical Record Patient Account Number: [id number] Number: Afful, RN, BSN, Treating RN: Aug 05, 1929 (82 y.o. [place] Sinkita Date of Birth/Sex: Female) Other Clinician: Primary Care Physician: [name] NicksMACDONALD, KERI Treating [name] KannerBritto, Denay Pleitez Referring Physician: Etheleen NicksMACDONALD, KERI Physician/Extender: [name] AdeWeeks in Treatment: 2 Diagnosis Coding ICD-10 Codes Code Description I89.0 Lymphedema, not elsewhere classified L97.222 Non-pressure chronic ulcer of left calf with fat layer exposed I80.203 Phlebitis  and thrombophlebitis of unspecified deep vessels of lower extremities, bilateral Physician Procedures CPT4: Description Modifier Quantity Code [id number] - WC PHYS LEVEL 3 - EST PT 1 ICD-10 Description Diagnosis I89.0 Lymphedema, not elsewhere classified L97.222 Non-pressure chronic ulcer of left calf with fat layer exposed I80.203 Phlebitis and  thrombophlebitis of unspecified deep vessels of lower extremities, bilateral Electronic  Signature(s) Signed: 07/03/2016 11:00:32 AM By: [name] Kanner MD, FACS Entered By: [name] on 07/03/2016 11:00:32

## 2016-07-10 ENCOUNTER — Ambulatory Visit: Payer: Medicare Other | Admitting: Surgery

## 2016-07-14 ENCOUNTER — Ambulatory Visit: Payer: Medicare Other | Admitting: Surgery

## 2016-07-18 ENCOUNTER — Encounter: Payer: Medicare Other | Attending: Surgery | Admitting: Surgery

## 2016-07-18 DIAGNOSIS — L97222 Non-pressure chronic ulcer of left calf with fat layer exposed: Secondary | ICD-10-CM | POA: Diagnosis not present

## 2016-07-18 DIAGNOSIS — I80203 Phlebitis and thrombophlebitis of unspecified deep vessels of lower extremities, bilateral: Secondary | ICD-10-CM | POA: Diagnosis not present

## 2016-07-18 DIAGNOSIS — Z87891 Personal history of nicotine dependence: Secondary | ICD-10-CM | POA: Insufficient documentation

## 2016-07-18 DIAGNOSIS — E785 Hyperlipidemia, unspecified: Secondary | ICD-10-CM | POA: Diagnosis not present

## 2016-07-18 DIAGNOSIS — I890 Lymphedema, not elsewhere classified: Secondary | ICD-10-CM | POA: Diagnosis not present

## 2016-07-18 DIAGNOSIS — Z7901 Long term (current) use of anticoagulants: Secondary | ICD-10-CM | POA: Insufficient documentation

## 2016-07-18 DIAGNOSIS — Z86718 Personal history of other venous thrombosis and embolism: Secondary | ICD-10-CM | POA: Insufficient documentation

## 2016-07-19 NOTE — Progress Notes (Signed)
[name] ([id number]) Visit Report for 07/18/2016 Arrival Information Details Patient Name: [name]. Date of Service: 07/18/2016 2:15 PM Medical Record Number: [id number] Patient Account Number: [id number] Date of Birth/Sex: [birth year] (83 y.o. Female) Treating RN: Afful, RN, BSN, [place] Sink Primary Care Physician: Etheleen Nicks Other Clinician: Referring Physician: Etheleen Nicks Treating Physician/Extender: Rudene Re in Treatment: 5 Visit Information History Since Last Visit Added or deleted any medications: No Patient Arrived: Ambulatory Any new allergies or adverse reactions: No Arrival Time: 14:09 Had a fall or experienced change in No Accompanied By: self activities of daily living that may affect Transfer Assistance: None risk of falls: Patient Identification Verified: Yes Signs or symptoms of abuse/neglect since last No Secondary Verification Process Yes visito Completed: Hospitalized since last visit: No Patient Requires Transmission- No Has Dressing in Place as Prescribed: Yes Based Precautions: Has Compression in Place as Prescribed: Yes Patient Has Alerts: Yes Pain Present Now: Yes Patient Alerts: Patient on Blood Thinner Warfarin Electronic Signature(s) Signed: 07/18/2016 3:43:30 PM By: Elpidio Eric BSN, RN Entered By: Elpidio Eric on 07/18/2016 14:34:43 Baade, [name] ([id number]) -------------------------------------------------------------------------------- Encounter Discharge Information Details Patient Name: [name], [name]. Date of Service: 07/18/2016 2:15 PM Medical Record Number: [id number] Patient Account Number: [id number] Date of Birth/Sex: Sep 16, 1929 (83 y.o. Female) Treating RN: Clover Mealy, RN, BSN, [place] Sink Primary Care Physician: Etheleen Nicks Other Clinician: Referring Physician: Etheleen Nicks Treating Physician/Extender: Rudene Re in Treatment: 5 Encounter Discharge Information Items Discharge Pain Level: 0 Discharge  Condition: Stable Ambulatory Status: Ambulatory Discharge Destination: Home Transportation: Private Auto Accompanied By: self Schedule Follow-up Appointment: No Medication Reconciliation completed and provided to Patient/Care No Dartanyon Frankowski: Provided on Clinical Summary of Care: 07/18/2016 Form Type Recipient Paper Patient IS Electronic Signature(s) Signed: 07/18/2016 3:43:30 PM By: Elpidio Eric BSN, RN Previous Signature: 07/18/2016 2:34:06 PM Version By: Gwenlyn Perking Entered By: Elpidio Eric on 07/18/2016 14:34:23 Daley, [name] J. ([id number]) -------------------------------------------------------------------------------- Lower Extremity Assessment Details Patient Name: [place], [name]. Date of Service: 07/18/2016 2:15 PM Medical Record Number: [id number] Patient Account Number: [id number] Date of Birth/Sex: 1930-05-19 (83 y.o. Female) Treating RN: Afful, RN, BSN, [place] Sink Primary Care Physician: Etheleen Nicks Other Clinician: Referring Physician: Etheleen Nicks Treating Physician/Extender: Rudene Re in Treatment: 5 Edema Assessment Assessed: [Left: No] [Right: No] Edema: [Left: Ye] [Right: s] Calf Left: Right: Point of Measurement: 30 cm From Medial Instep 45.6 cm cm Ankle Left: Right: Point of Measurement: 13 cm From Medial Instep 26.3 cm cm Vascular Assessment Claudication: Claudication Assessment [Left:None] Pulses: Dorsalis Pedis Palpable: [Left:Yes] Posterior Tibial Extremity colors, hair growth, and conditions: Extremity Color: [Left:Mottled] Hair Growth on Extremity: [Left:Yes] Temperature of Extremity: [Left:Warm] Capillary Refill: [Left:< 3 seconds] Toe Nail Assessment Left: Right: Thick: Yes Discolored: Yes Deformed: No Improper Length and Hygiene: No Electronic Signature(s) Signed: 07/18/2016 3:43:30 PM By: Elpidio Eric BSN, RN Entered By: Elpidio Eric on 07/18/2016 14:13:10 [place], [name] J. ([id number]) [name], [name] J.  ([id number]) -------------------------------------------------------------------------------- Multi Wound Chart Details Patient Name: [place], [name]. Date of Service: 07/18/2016 2:15 PM Medical Record Number: [id number] Patient Account Number: [id number] Date of Birth/Sex: 1929/12/02 (83 y.o. Female) Treating RN: Clover Mealy, RN, BSN, [place] Sink Primary Care Physician: Etheleen Nicks Other Clinician: Referring Physician: Etheleen Nicks Treating Physician/Extender: Rudene Re in Treatment: 5 Vital Signs Height(in): 66 Pulse(bpm): 70 Weight(lbs): 221 Blood Pressure 132/87 (mmHg): Body Mass Index(BMI): 36 Temperature(F): 98.1 Respiratory Rate 18 (breaths/min): Photos: [1:No Photos] [2:No Photos] [N/A:N/A] Wound Location: [1:Left Lower Leg - Lateral, Proximal] [2:Left Lower Leg - Lateral,  Distal] [N/A:N/A] Wounding Event: [1:Gradually Appeared] [2:Gradually Appeared] [N/A:N/A] Primary Etiology: [1:Trauma, Other] [2:Lymphedema] [N/A:N/A] Secondary Etiology: [1:Lymphedema] [2:Venous Leg Ulcer] [N/A:N/A] Comorbid History: [1:Lymphedema, Asthma, Deep Vein Thrombosis, Received Chemotherapy, Received Radiation] [2:Lymphedema, Asthma, Deep Vein Thrombosis, Received Chemotherapy, Received Radiation] [N/A:N/A] Date Acquired: [1:05/14/2016] [2:05/14/2016] [N/A:N/A] Weeks of Treatment: [1:5] [2:5] [N/A:N/A] Wound Status: [1:Open] [2:Open] [N/A:N/A] Measurements L x W x D 1x1x0.1 [2:0.5x0.5x0.1] [N/A:N/A] (cm) Area (cm) : [1:0.785] [2:0.196] [N/A:N/A] Volume (cm) : [1:0.079] [2:0.02] [N/A:N/A] % Reduction in Area: [1:16.70%] [2:90.80%] [N/A:N/A] % Reduction in Volume: 16.00% [2:95.30%] [N/A:N/A] Classification: [1:Full Thickness Without Exposed Support Structures] [2:Full Thickness Without Exposed Support Structures] [N/A:N/A] Exudate Amount: [1:Small] [2:Small] [N/A:N/A] Exudate Type: [1:Serosanguineous] [2:Serosanguineous] [N/A:N/A] Exudate Color: [1:red, brown] [2:red, brown]  [N/A:N/A] Wound Margin: [1:Flat and Intact] [2:Flat and Intact] [N/A:N/A] Granulation Amount: [1:Medium (34-66%)] [2:Medium (34-66%)] [N/A:N/A] Granulation Quality: [1:Red] [2:Pink] [N/A:N/A] Necrotic Amount: [1:Small (1-33%)] [2:Medium (34-66%)] [N/A:N/A] Exposed Structures: [N/A:N/A] Fat: Yes Fat: Yes Fascia: No Fascia: No Tendon: No Tendon: No Muscle: No Muscle: No Joint: No Joint: No Bone: No Bone: No Epithelialization: None Medium (34-66%) N/A Periwound Skin Texture: Excoriation: Yes Induration: Yes N/A Edema: No Induration: No Callus: No Crepitus: No Fluctuance: No Friable: No Rash: No Scarring: No Periwound Skin Moist: Yes Moist: Yes N/A Moisture: Maceration: No Dry/Scaly: No Periwound Skin Color: Ecchymosis: Yes Ecchymosis: Yes N/A Atrophie Blanche: No Mottled: Yes Cyanosis: No Erythema: No Hemosiderin Staining: No Mottled: No Pallor: No Rubor: No Temperature: No Abnormality No Abnormality N/A Tenderness on No No N/A Palpation: Wound Preparation: Ulcer Cleansing: Ulcer Cleansing: N/A Rinsed/Irrigated with Rinsed/Irrigated with Saline, Other: surg scrub Saline, Other: surg scrub and water and water Topical Anesthetic Topical Anesthetic Applied: Other: lidocaine Applied: Other: lidocaine 4% 4% Treatment Notes Wound #1 (Left, Proximal, Lateral Lower Leg) 1. Cleansed with: May Shower, gently pat wound dry prior to applying new dressing. 4. Dressing Applied: Foam 7. Secured with 4-Layer Compression System - Left Lower Extremity Wound #2 (Left, Distal, Lateral Lower Leg) Tassin, [name] J. ([id number]) 1. Cleansed with: May Shower, gently pat wound dry prior to applying new dressing. 4. Dressing Applied: Foam 7. Secured with 4-Layer Compression System - Left Lower Extremity Electronic Signature(s) Signed: 07/18/2016 2:38:14 PM By: Evlyn Kanner MD, FACS Entered By: Evlyn Kanner on 07/18/2016 14:38:14 [place], [name]  ([id number]) -------------------------------------------------------------------------------- Multi-Disciplinary Care Plan Details Patient Name: [place], [place]. Date of Service: 07/18/2016 2:15 PM Medical Record Number: [id number] Patient Account Number: [id number] Date of Birth/Sex: May 29, 1930 (83 y.o. Female) Treating RN: Afful, RN, BSN, [place] Sink Primary Care Physician: Etheleen Nicks Other Clinician: Referring Physician: Etheleen Nicks Treating Physician/Extender: Rudene Re in Treatment: 5 Active Inactive Abuse / Safety / Falls / Self Care Management Nursing Diagnoses: Impaired physical mobility Potential for falls Goals: Patient will remain injury free Date Initiated: 06/16/2016 Goal Status: Active Interventions: Assess fall risk on admission and as needed Assess self care needs on admission and as needed Notes: Orientation to the Wound Care Program Nursing Diagnoses: Knowledge deficit related to the wound healing center program Goals: Patient/caregiver will verbalize understanding of the Wound Healing Center Program Date Initiated: 06/16/2016 Goal Status: Active Interventions: Provide education on orientation to the wound center Notes: Venous Leg Ulcer Nursing Diagnoses: Potential for venous Insuffiency (use before diagnosis confirmed) GoalsGWENDALYN, MCGONAGLE. ([id number]) Non-invasive venous studies are completed as ordered Date Initiated: 06/16/2016 Goal Status: Active Interventions: Assess peripheral edema status every visit. Compression as ordered Treatment Activities: Non-invasive vascular studies : 06/13/2016 Therapeutic compression applied : 06/13/2016 Notes: Wound/Skin  Impairment Nursing Diagnoses: Impaired tissue integrity Goals: Patient/caregiver will verbalize understanding of skin care regimen Date Initiated: 06/16/2016 Goal Status: Active Ulcer/skin breakdown will heal within 14 weeks Date Initiated: 06/16/2016 Goal Status:  Active Interventions: Assess patient/caregiver ability to obtain necessary supplies Assess ulceration(s) every visit Notes: Electronic Signature(s) Signed: 07/18/2016 3:43:30 PM By: Elpidio Eric BSN, RN Entered By: Elpidio Eric on 07/18/2016 14:23:24 [place], [place] J. ([id number]) -------------------------------------------------------------------------------- Pain Assessment Details Patient Name: [place], [name]. Date of Service: 07/18/2016 2:15 PM Medical Record Number: [id number] Patient Account Number: [id number] Date of Birth/Sex: [birth year] (83 y.o. Female) Treating RN: Clover Mealy, RN, BSN, [place] Sink Primary Care Physician: Etheleen Nicks Other Clinician: Referring Physician: Etheleen Nicks Treating Physician/Extender: Rudene Re in Treatment: 5 Active Problems Location of Pain Severity and Description of Pain Patient Has Paino Yes Site Locations Pain Location: Pain in Ulcers Rate the pain. Current Pain Level: 3 Character of Pain Describe the Pain: Tender Pain Management and Medication Current Pain Management: Medication: Yes Rest: Yes How does your pain impact your activities of daily livingo Sleep: Yes Bathing: Yes Appetite: Yes Relationship With Others: Yes Bladder Continence: Yes Emotions: Yes Bowel Continence: Yes Work: Yes Toileting: Yes Drive: Yes Dressing: Yes Hobbies: Manufacturing systems engineer) Signed: 07/18/2016 3:43:30 PM By: Elpidio Eric BSN, RN Entered By: Elpidio Eric on 07/18/2016 14:10:55 Taney, [name] ([id number]) -------------------------------------------------------------------------------- Patient/Caregiver Education Details Patient Name: [place], [name]. Date of Service: 07/18/2016 2:15 PM Medical Record Number: [id number] Patient Account Number: [id number] Date of Birth/Gender: 03-Aug-1929 (83 y.o. Female) Treating RN: Afful, RN, BSN, [place] Sink Primary Care Physician: Etheleen Nicks Other Clinician: Referring Physician: Etheleen Nicks Treating  Physician/Extender: Rudene Re in Treatment: 5 Education Assessment Education Provided To: Patient Education Topics Provided Welcome To The Wound Care Center: Methods: Explain/Verbal Responses: State content correctly Electronic Signature(s) Signed: 07/18/2016 3:43:30 PM By: Elpidio Eric BSN, RN Entered By: Elpidio Eric on 07/18/2016 14:34:35 Tosi, Gregory J. ([id number]) -------------------------------------------------------------------------------- Wound Assessment Details Patient Name: [place], [name]. Date of Service: 07/18/2016 2:15 PM Medical Record Number: [id number] Patient Account Number: [id number] Date of Birth/Sex: 1930/02/17 (83 y.o. Female) Treating RN: Clover Mealy, RN, BSN, [place] Sink Primary Care Physician: Etheleen Nicks Other Clinician: Referring Physician: Etheleen Nicks Treating Physician/Extender: Rudene Re in Treatment: 5 Wound Status Wound Number: 1 Primary Trauma, Other Etiology: Wound Location: Left Lower Leg - Lateral, Proximal Secondary Lymphedema Etiology: Wounding Event: Gradually Appeared Wound Open Date Acquired: 05/14/2016 Status: Weeks Of Treatment: 5 Comorbid Lymphedema, Asthma, Deep Vein Clustered Wound: No History: Thrombosis, Received Chemotherapy, Received Radiation Photos Photo Uploaded By: Elpidio Eric on 07/18/2016 14:51:48 Wound Measurements Length: (cm) 1 Width: (cm) 1 Depth: (cm) 0.1 Area: (cm) 0.785 Volume: (cm) 0.079 % Reduction in Area: 16.7% % Reduction in Volume: 16% Epithelialization: None Tunneling: No Undermining: No Wound Description Full Thickness Without Exposed Classification: Support Structures [place], [name] J. ([id number]) Wound Margin: Flat and Intact Exudate Small Amount: Exudate Type: Serosanguineous Exudate Color: red, brown Wound Bed Granulation Amount: Medium (34-66%) Exposed Structure Granulation Quality: Red Fascia Exposed: No Necrotic Amount: Small (1-33%) Fat Layer Exposed:  Yes Necrotic Quality: Adherent Slough Tendon Exposed: No Muscle Exposed: No Joint Exposed: No Bone Exposed: No Periwound Skin Texture Texture Color No Abnormalities Noted: No No Abnormalities Noted: No Callus: No Atrophie Blanche: No Crepitus: No Cyanosis: No Excoriation: Yes Ecchymosis: Yes Fluctuance: No Erythema: No Friable: No Hemosiderin Staining: No Induration: No Mottled: No Localized Edema: No Pallor: No Rash: No Rubor: No Scarring: No Temperature / Pain Moisture Temperature: No Abnormality No  Abnormalities Noted: No Dry / Scaly: No Maceration: No Moist: Yes Wound Preparation Ulcer Cleansing: Rinsed/Irrigated with Saline, Other: surg scrub and water, Topical Anesthetic Applied: Other: lidocaine 4%, Treatment Notes Wound #1 (Left, Proximal, Lateral Lower Leg) 1. Cleansed with: May Shower, gently pat wound dry prior to applying new dressing. 4. Dressing Applied: Foam 7. Secured with 4-Layer Compression System - Left Lower Extremity Electronic Signature(s) Roe CoombsSMITH, Marquetta J. ([id number]) Signed: 07/18/2016 3:43:30 PM By: Elpidio EricAfful, Rita BSN, RN Entered By: Elpidio EricAfful, Rita on 07/18/2016 14:22:48 Colet[place], [name] ([id number]) -------------------------------------------------------------------------------- Wound Assessment Details Patient Name: ManningSMITH, [name] Date of Service: 07/18/2016 2:15 PM Medical Record Number: [id number] Patient Account Number: [id number] Date of Birth/Sex: 1929-11-13 (83 y.o. Female) Treating RN: Clover MealyAfful, RN, BSN, [place] Sinkita Primary Care Physician: Etheleen NicksMACDONALD, KERI Other Clinician: Referring Physician: Etheleen NicksMACDONALD, KERI Treating Physician/Extender: Rudene ReBritto, Errol Weeks in Treatment: 5 Wound Status Wound Number: 2 Primary Lymphedema Etiology: Wound Location: Left Lower Leg - Lateral, Distal Secondary Venous Leg Ulcer Wounding Event: Gradually Appeared Etiology: Date Acquired: 05/14/2016 Wound Open Weeks Of Treatment: 5 Status: Clustered Wound:  No Comorbid Lymphedema, Asthma, Deep Vein History: Thrombosis, Received Chemotherapy, Received Radiation Photos Photo Uploaded By: Elpidio EricAfful, Rita on 07/18/2016 14:51:49 Wound Measurements Length: (cm) 0.5 Width: (cm) 0.5 Depth: (cm) 0.1 Area: (cm) 0.196 Volume: (cm) 0.02 % Reduction in Area: 90.8% % Reduction in Volume: 95.3% Epithelialization: Medium (34-66%) Tunneling: No Undermining: No Wound Description Full Thickness Without Exposed Classification: Support Structures [place] East[place], Avelina J. ([id number]) Wound Margin: Flat and Intact Exudate Small Amount: Exudate Type: Serosanguineous Exudate Color: red, brown Wound Bed Granulation Amount: Medium (34-66%) Exposed Structure Granulation Quality: Pink Fascia Exposed: No Necrotic Amount: Medium (34-66%) Fat Layer Exposed: Yes Necrotic Quality: Adherent Slough Tendon Exposed: No Muscle Exposed: No Joint Exposed: No Bone Exposed: No Periwound Skin Texture Texture Color No Abnormalities Noted: No No Abnormalities Noted: No Induration: Yes Ecchymosis: Yes Mottled: Yes Moisture No Abnormalities Noted: No Temperature / Pain Moist: Yes Temperature: No Abnormality Wound Preparation Ulcer Cleansing: Rinsed/Irrigated with Saline, Other: surg scrub and water, Topical Anesthetic Applied: Other: lidocaine 4%, Treatment Notes Wound #2 (Left, Distal, Lateral Lower Leg) 1. Cleansed with: May Shower, gently pat wound dry prior to applying new dressing. 4. Dressing Applied: Foam 7. Secured with 4-Layer Compression System - Left Lower Extremity Electronic Signature(s) Signed: 07/18/2016 3:43:30 PM By: Elpidio EricAfful, Rita BSN, RN Entered By: Elpidio EricAfful, Rita on 07/18/2016 14:23:05 [place], [name] ([id number]) -------------------------------------------------------------------------------- Vitals Details Patient Name: [place], [name] Date of Service: 07/18/2016 2:15 PM Medical Record Number: [id number] Patient Account Number: [id number] Date  of Birth/Sex: 1929-11-13 (83 y.o. Female) Treating RN: Afful, RN, BSN, Rita Primary Care Physician: Etheleen NicksMACDONALD, KERI Other Clinician: Referring Physician: Etheleen NicksMACDONALD, KERI Treating Physician/Extender: Rudene ReBritto, Errol Weeks in Treatment: 5 Vital Signs Time Taken: 14:13 Temperature (F): 98.1 Height (in): 66 Pulse (bpm): 70 Weight (lbs): 221 Respiratory Rate (breaths/min): 18 Body Mass Index (BMI): 35.7 Blood Pressure (mmHg): 132/87 Reference Range: 80 - 120 mg / dl Electronic Signature(s) Signed: 07/18/2016 3:43:30 PM By: Elpidio EricAfful, Rita BSN, RN Entered By: Elpidio EricAfful, Rita on 07/18/2016 14:13:38

## 2016-07-19 NOTE — Progress Notes (Signed)
[name], Gordon ([id number]) Visit Report for 07/18/2016 Chief Complaint Document Details Patient Name: [name], [name]. Date of Service: 07/18/2016 2:15 PM Medical Record Patient Account Number: [id number] Number: Afful, RN, BSN, Treating RN: [birth year] (83 y.o. [place] Sink Date of Birth/Sex: Female) Other Clinician: Primary Care Physician: Etheleen Nicks Treating Evlyn Kanner Referring Physician: Etheleen Nicks Physician/Extender: Tania Ade in Treatment: 5 Information Obtained from: Patient Chief Complaint Patient presents to the wound care center for a consult due non healing wound to the left lower extremity which she's had for about a month but she's had bilateral lower extremity swelling for about 15 years Electronic Signature(s) Signed: 07/18/2016 2:38:20 PM By: Evlyn Kanner MD, FACS Entered By: Evlyn Kanner on 07/18/2016 14:38:20 Smay, Lakeena J. ([id number]) -------------------------------------------------------------------------------- HPI Details Patient Name: [name]. Date of Service: 07/18/2016 2:15 PM Medical Record Patient Account Number: [id number] Number: Afful, RN, BSN, Treating RN: 04/04/1930 (83 y.o. [place] Sink Date of Birth/Sex: Female) Other Clinician: Primary Care Physician: Etheleen Nicks Treating Evlyn Kanner Referring Physician: Etheleen Nicks Physician/Extender: Tania Ade in Treatment: 5 History of Present Illness Location: ulcerated area on the lateral part of her left calf Quality: Patient reports experiencing a dull pain to affected area(s). Severity: Patient states wound are getting worse. Duration: Patient has had the wound for < 4 weeks prior to presenting for treatment Timing: Pain in wound is Intermittent (comes and goes Context: The wound appeared gradually over time Modifying Factors: Other treatment(s) tried include:she has been wearing what sounds like lymphedema pumps for the last 12 years or so Associated Signs and Symptoms:  Patient reports having increase swelling. HPI Description: 83 year old patient who comes with bilateral lower extremity edema which she's had for at least 15 years and now an ulcerated area on the left lower extremity which she's had for a month. He was recently seen by the PCP who made a diagnosis of venous stasis ulcer of the left calf and referred her to the wound center. He was placed on clindamycin. Past medical history significant for colon cancer, history of DVT, left tibial fracture in 2012 which required surgery, hyperlipidemia and has been on warfarin for several years. She also gives a history of having lymph pumps and she has been wearing them for about 12 years. She is a former smoker and has quit about 10 years ago. 06/23/2016 -- she has not yet got her venous duplex study done but she did bring her lymphedema pumps and we have checked him out and they seem to be functioning fine 07/03/2016 -- a venous duplex study has not been scheduled for another month and I believe she has an appointment on January 28 Electronic Signature(s) Signed: 07/18/2016 2:38:39 PM By: Evlyn Kanner MD, FACS Entered By: Evlyn Kanner on 07/18/2016 14:38:38 Kincheloe, Taquilla J. ([id number]) -------------------------------------------------------------------------------- Physical Exam Details Patient Name: [name]. Date of Service: 07/18/2016 2:15 PM Medical Record Patient Account Number: [id number] Number: Afful, RN, BSN, Treating RN: 11/27/1929 (83 y.o.  Sink Date of Birth/Sex: Female) Other Clinician: Primary Care Physician: Etheleen Nicks Treating Evlyn Kanner Referring Physician: Etheleen Nicks Physician/Extender: Weeks in Treatment: 5 Constitutional . Pulse regular. Respirations normal and unlabored. Afebrile. . Eyes Nonicteric. Reactive to light. Ears, Nose, Mouth, and Throat Lips, teeth, and gums WNL.Marland Kitchen Moist mucosa without lesions. Neck supple and nontender. No palpable  supraclavicular or cervical adenopathy. Normal sized without goiter. Respiratory WNL. No retractions.. Breath sounds WNL, No rubs, rales, rhonchi, or wheeze.. Cardiovascular Heart rhythm and rate regular, no murmur or gallop.. Pedal  Pulses WNL. No clubbing, cyanosis or edema. Lymphatic No adneopathy. No adenopathy. No adenopathy. Musculoskeletal Adexa without tenderness or enlargement.. Digits and nails w/o clubbing, cyanosis, infection, petechiae, ischemia, or inflammatory conditions.. Integumentary (Hair, Skin) No suspicious lesions. No crepitus or fluctuance. No peri-wound warmth or erythema. No masses.Marland Kitchen Psychiatric Judgement and insight Intact.. No evidence of depression, anxiety, or agitation.. Notes the lymphedema is better overall and the wound had dense eschar which was gently removed and except for a small area of 2-3 mm everything else is completely healed Electronic Signature(s) Signed: 07/18/2016 2:39:06 PM By: Evlyn Kanner MD, FACS Entered By: Evlyn Kanner on 07/18/2016 14:39:06 Umholtz, Lidia Gordon ([id number]) -------------------------------------------------------------------------------- Physician Orders Details Patient Name: [name]. Date of Service: 07/18/2016 2:15 PM Medical Record Patient Account Number: [id number] Number: Afful, RN, BSN, Treating RN: 12-Jan-1930 (83 y.o. [place] Sink Date of Birth/Sex: Female) Other Clinician: Primary Care Physician: Etheleen Nicks Treating Evlyn Kanner Referring Physician: Etheleen Nicks Physician/Extender: Tania Ade in Treatment: 5 Verbal / Phone Orders: Yes Clinician: Afful, RN, BSN, Rita Read Back and Verified: Yes Diagnosis Coding Wound Cleansing Wound #1 Left,Proximal,Lateral Lower Leg o Clean wound with Normal Saline. Wound #2 Left,Distal,Lateral Lower Leg o Clean wound with Normal Saline. Anesthetic Wound #1 Left,Proximal,Lateral Lower Leg o Topical Lidocaine 4% cream applied to wound bed prior to  debridement Wound #2 Left,Distal,Lateral Lower Leg o Topical Lidocaine 4% cream applied to wound bed prior to debridement Primary Wound Dressing Wound #1 Left,Proximal,Lateral Lower Leg o Foam Wound #2 Left,Distal,Lateral Lower Leg o Foam Secondary Dressing Wound #1 Left,Proximal,Lateral Lower Leg o ABD pad Wound #2 Left,Distal,Lateral Lower Leg o ABD pad Dressing Change Frequency Wound #1 Left,Proximal,Lateral Lower Leg o Change dressing every week o Other: - Tuesday for dressing change, if needed. Wound #2 Left,Distal,Lateral Lower Leg Kempton, Zorianna J. ([id number]) o Change dressing every week Follow-up Appointments Wound #1 Left,Proximal,Lateral Lower Leg o Return Appointment in 1 week. Wound #2 Left,Distal,Lateral Lower Leg o Return Appointment in 1 week. Edema Control Wound #1 Left,Proximal,Lateral Lower Leg o 4-Layer Compression System - Left Lower Extremity Wound #2 Left,Distal,Lateral Lower Leg o 4-Layer Compression System - Left Lower Extremity Additional Orders / Instructions Wound #1 Left,Proximal,Lateral Lower Leg o Increase protein intake. o Activity as tolerated Wound #2 Left,Distal,Lateral Lower Leg o Increase protein intake. o Activity as tolerated Electronic Signature(s) Signed: 07/18/2016 3:43:30 PM By: Elpidio Eric BSN, RN Signed: 07/18/2016 4:00:20 PM By: Evlyn Kanner MD, FACS Entered By: Elpidio Eric on 07/18/2016 14:25:22 Griffin, Lidia Gordon ([id number]) -------------------------------------------------------------------------------- Problem List Details Patient Name: [place], [name] Date of Service: 07/18/2016 2:15 PM Medical Record Patient Account Number: [id number] Number: Afful, RN, BSN, Treating RN: 1930-03-03 (83 y.o. [place] Sink Date of Birth/Sex: Female) Other Clinician: Primary Care Physician: Etheleen Nicks Treating Evlyn Kanner Referring Physician: Etheleen Nicks Physician/Extender: Tania Ade in Treatment:  5 Active Problems ICD-10 Encounter Code Description Active Date Diagnosis I89.0 Lymphedema, not elsewhere classified 06/13/2016 Yes L97.222 Non-pressure chronic ulcer of left calf with fat layer 06/13/2016 Yes exposed I80.203 Phlebitis and thrombophlebitis of unspecified deep 06/13/2016 Yes vessels of lower extremities, bilateral Inactive Problems Resolved Problems Electronic Signature(s) Signed: 07/18/2016 2:38:10 PM By: Evlyn Kanner MD, FACS Entered By: Evlyn Kanner on 07/18/2016 14:38:10 [place]chatton, Mikea J. ([id number]) -------------------------------------------------------------------------------- Progress Note Details Patient Name: Gillespie, Lidia Gordon. Date of Service: 07/18/2016 2:15 PM Medical Record Patient Account Number: [id number] Number: Afful, RN, BSN, Treating RN: Aug 29, 1929 (83 y.o. [place] Sink Date of Birth/Sex: Female) Other Clinician: Primary Care Physician: Etheleen Nicks Treating Makaylee Spielberg, Glory Buff  Referring Physician: Etheleen Nicks Physician/Extender: Tania Ade in Treatment: 5 Subjective Chief Complaint Information obtained from Patient Patient presents to the wound care center for a consult due non healing wound to the left lower extremity which she's had for about a month but she's had bilateral lower extremity swelling for about 15 years History of Present Illness (HPI) The following HPI elements were documented for the patient's wound: Location: ulcerated area on the lateral part of her left calf Quality: Patient reports experiencing a dull pain to affected area(s). Severity: Patient states wound are getting worse. Duration: Patient has had the wound for < 4 weeks prior to presenting for treatment Timing: Pain in wound is Intermittent (comes and goes Context: The wound appeared gradually over time Modifying Factors: Other treatment(s) tried include:she has been wearing what sounds like lymphedema pumps for the last 12 years or so Associated Signs and Symptoms:  Patient reports having increase swelling. 83 year old patient who comes with bilateral lower extremity edema which she's had for at least 15 years and now an ulcerated area on the left lower extremity which she's had for a month. He was recently seen by the PCP who made a diagnosis of venous stasis ulcer of the left calf and referred her to the wound center. He was placed on clindamycin. Past medical history significant for colon cancer, history of DVT, left tibial fracture in 2012 which required surgery, hyperlipidemia and has been on warfarin for several years. She also gives a history of having lymph pumps and she has been wearing them for about 12 years. She is a former smoker and has quit about 10 years ago. 06/23/2016 -- she has not yet got her venous duplex study done but she did bring her lymphedema pumps and we have checked him out and they seem to be functioning fine 07/03/2016 -- a venous duplex study has not been scheduled for another month and I believe she has an appointment on January 28 Objective Ozimek, [place] J. ([id number]) Constitutional Pulse regular. Respirations normal and unlabored. Afebrile. Vitals Time Taken: 2:13 PM, Height: 66 in, Weight: 221 lbs, BMI: 35.7, Temperature: 98.1 F, Pulse: 70 bpm, Respiratory Rate: 18 breaths/min, Blood Pressure: 132/87 mmHg. Eyes Nonicteric. Reactive to light. Ears, Nose, Mouth, and Throat Lips, teeth, and gums WNL.Marland Kitchen Moist mucosa without lesions. Neck supple and nontender. No palpable supraclavicular or cervical adenopathy. Normal sized without goiter. Respiratory WNL. No retractions.. Breath sounds WNL, No rubs, rales, rhonchi, or wheeze.. Cardiovascular Heart rhythm and rate regular, no murmur or gallop.. Pedal Pulses WNL. No clubbing, cyanosis or edema. Lymphatic No adneopathy. No adenopathy. No adenopathy. Musculoskeletal Adexa without tenderness or enlargement.. Digits and nails w/o clubbing, cyanosis, infection,  petechiae, ischemia, or inflammatory conditions.Marland Kitchen Psychiatric Judgement and insight Intact.. No evidence of depression, anxiety, or agitation.. General Notes: the lymphedema is better overall and the wound had dense eschar which was gently removed and except for a small area of 2-3 mm everything else is completely healed Integumentary (Hair, Skin) No suspicious lesions. No crepitus or fluctuance. No peri-wound warmth or erythema. No masses.. Wound #1 status is Open. Original cause of wound was Gradually Appeared. The wound is located on the Left,Proximal,Lateral Lower Leg. The wound measures 1cm length x 1cm width x 0.1cm depth; 0.785cm^2 area and 0.079cm^3 volume. There is fat exposed. There is no tunneling or undermining noted. There is a small amount of serosanguineous drainage noted. The wound margin is flat and intact. There is medium (34-66%) red granulation within the wound bed. There  is a small (1-33%) amount of necrotic tissue within the wound bed including Adherent Slough. The periwound skin appearance exhibited: Excoriation, Moist, Ecchymosis. The periwound skin appearance did not exhibit: Callus, Crepitus, Fluctuance, Friable, Induration, Localized Edema, Rash, Scarring, Dry/Scaly, Maceration, Atrophie Blanche, Cyanosis, Hemosiderin Staining, Mottled, Pallor, Rubor, Erythema. Periwound temperature was noted as No Abnormality. [place], Deandria J. ([id number]) Wound #2 status is Open. Original cause of wound was Gradually Appeared. The wound is located on the Left,Distal,Lateral Lower Leg. The wound measures 0.5cm length x 0.5cm width x 0.1cm depth; 0.196cm^2 area and 0.02cm^3 volume. There is fat exposed. There is no tunneling or undermining noted. There is a small amount of serosanguineous drainage noted. The wound margin is flat and intact. There is medium (34-66%) pink granulation within the wound bed. There is a medium (34-66%) amount of necrotic tissue within the wound bed including  Adherent Slough. The periwound skin appearance exhibited: Induration, Moist, Ecchymosis, Mottled. Periwound temperature was noted as No Abnormality. Assessment Active Problems ICD-10 I89.0 - Lymphedema, not elsewhere classified L97.222 - Non-pressure chronic ulcer of left calf with fat layer exposed I80.203 - Phlebitis and thrombophlebitis of unspecified deep vessels of lower extremities, bilateral Plan Wound Cleansing: Wound #1 Left,Proximal,Lateral Lower Leg: Clean wound with Normal Saline. Wound #2 Left,Distal,Lateral Lower Leg: Clean wound with Normal Saline. Anesthetic: Wound #1 Left,Proximal,Lateral Lower Leg: Topical Lidocaine 4% cream applied to wound bed prior to debridement Wound #2 Left,Distal,Lateral Lower Leg: Topical Lidocaine 4% cream applied to wound bed prior to debridement Primary Wound Dressing: Wound #1 Left,Proximal,Lateral Lower Leg: Foam Wound #2 Left,Distal,Lateral Lower Leg: Foam Secondary Dressing: Wound #1 Left,Proximal,Lateral Lower Leg: ABD pad Wound #2 Left,Distal,Lateral Lower Leg: ABD pad Dressing Change Frequency: Wound #1 Left,Proximal,Lateral Lower Leg: Change dressing every week [name] CoombsSMITH, Sharday J. ([id number]) Other: - Tuesday for dressing change, if needed. Wound #2 Left,Distal,Lateral Lower Leg: Change dressing every week Follow-up Appointments: Wound #1 Left,Proximal,Lateral Lower Leg: Return Appointment in 1 week. Wound #2 Left,Distal,Lateral Lower Leg: Return Appointment in 1 week. Edema Control: Wound #1 Left,Proximal,Lateral Lower Leg: 4-Layer Compression System - Left Lower Extremity Wound #2 Left,Distal,Lateral Lower Leg: 4-Layer Compression System - Left Lower Extremity Additional Orders / Instructions: Wound #1 Left,Proximal,Lateral Lower Leg: Increase protein intake. Activity as tolerated Wound #2 Left,Distal,Lateral Lower Leg: Increase protein intake. Activity as tolerated At this stage I have recommended: 1. Foam and a  Profore 4-layer to be applied and changed at weekly intervals 2. Venous reflux study both lower extremities -- appointments January 28 3. Discussed elevation and exercise 4. to bring her compression stockings with her next week as I anticipate discharge Electronic Signature(s) Signed: 07/18/2016 2:40:12 PM By: Evlyn KannerBritto, Aundra Espin MD, FACS Entered By: Evlyn KannerBritto, Juliet Vasbinder on 07/18/2016 14:40:12 Schlechter, Tahjae J. ([id number]) -------------------------------------------------------------------------------- SuperBill Details Patient Name: [place], Lidia [name] Date of Service: 07/18/2016 Medical Record Patient Account Number: [id number] Number: Afful, RN, BSN, Treating RN: [birth year] (83 y.o. [place] Date of Birth/Sex: Female) Other Clinician: Primary Care Physician: Etheleen NicksMACDONALD, KERI Treating Jerson Furukawa Referring Physician: Etheleen NicksMACDONALD, KERI Physician/Extender: Tania AdeWeeks in Treatment: 5 Diagnosis Coding ICD-10 Codes Code Description I89.0 Lymphedema, not elsewhere classified L97.222 Non-pressure chronic ulcer of left calf with fat layer exposed I80.203 Phlebitis and thrombophlebitis of unspecified deep vessels of lower extremities, bilateral Physician Procedures CPT4: Description Modifier Quantity Code [id number] - WC PHYS LEVEL 3 - EST PT 1 ICD-10 Description Diagnosis I89.0 Lymphedema, not elsewhere classified L97.222 Non-pressure chronic ulcer of left calf with fat layer exposed I80.203 Phlebitis and  thrombophlebitis of unspecified  deep vessels of lower extremities, bilateral Electronic Signature(s) Signed: 07/18/2016 2:40:29 PM By: Evlyn Kanner MD, FACS Entered By: Evlyn Kanner on 07/18/2016 14:40:29

## 2016-07-25 ENCOUNTER — Ambulatory Visit: Payer: Medicare Other | Admitting: Surgery

## 2016-07-31 ENCOUNTER — Encounter: Payer: Medicare Other | Admitting: Surgery

## 2016-07-31 DIAGNOSIS — I890 Lymphedema, not elsewhere classified: Secondary | ICD-10-CM | POA: Diagnosis not present

## 2016-08-01 ENCOUNTER — Emergency Department: Payer: Medicare Other

## 2016-08-01 ENCOUNTER — Encounter: Payer: Self-pay | Admitting: Emergency Medicine

## 2016-08-01 ENCOUNTER — Observation Stay
Admission: EM | Admit: 2016-08-01 | Discharge: 2016-08-03 | Disposition: A | Discharge status: Home Health | Payer: Medicare Other | Attending: Internal Medicine | Admitting: Internal Medicine

## 2016-08-01 DIAGNOSIS — M8588 Other specified disorders of bone density and structure, other site: Secondary | ICD-10-CM | POA: Insufficient documentation

## 2016-08-01 DIAGNOSIS — W07XXXA Fall from chair, initial encounter: Secondary | ICD-10-CM | POA: Diagnosis not present

## 2016-08-01 DIAGNOSIS — Z86718 Personal history of other venous thrombosis and embolism: Secondary | ICD-10-CM | POA: Insufficient documentation

## 2016-08-01 DIAGNOSIS — M1612 Unilateral primary osteoarthritis, left hip: Secondary | ICD-10-CM | POA: Diagnosis not present

## 2016-08-01 DIAGNOSIS — M545 Low back pain, unspecified: Secondary | ICD-10-CM

## 2016-08-01 DIAGNOSIS — M25552 Pain in left hip: Secondary | ICD-10-CM | POA: Insufficient documentation

## 2016-08-01 DIAGNOSIS — Z66 Do not resuscitate: Secondary | ICD-10-CM | POA: Diagnosis not present

## 2016-08-01 DIAGNOSIS — Z7901 Long term (current) use of anticoagulants: Secondary | ICD-10-CM | POA: Insufficient documentation

## 2016-08-01 DIAGNOSIS — Z79899 Other long term (current) drug therapy: Secondary | ICD-10-CM | POA: Insufficient documentation

## 2016-08-01 DIAGNOSIS — S39012A Strain of muscle, fascia and tendon of lower back, initial encounter: Principal | ICD-10-CM | POA: Insufficient documentation

## 2016-08-01 DIAGNOSIS — Y92009 Unspecified place in unspecified non-institutional (private) residence as the place of occurrence of the external cause: Secondary | ICD-10-CM | POA: Diagnosis not present

## 2016-08-01 DIAGNOSIS — I672 Cerebral atherosclerosis: Secondary | ICD-10-CM | POA: Insufficient documentation

## 2016-08-01 DIAGNOSIS — M549 Dorsalgia, unspecified: Secondary | ICD-10-CM | POA: Diagnosis present

## 2016-08-01 DIAGNOSIS — W19XXXA Unspecified fall, initial encounter: Secondary | ICD-10-CM

## 2016-08-01 LAB — CBC
HEMATOCRIT: 36.1 % (ref 35.0–47.0)
Hemoglobin: 12 g/dL (ref 12.0–16.0)
MCH: 30.4 pg (ref 26.0–34.0)
MCHC: 33.3 g/dL (ref 32.0–36.0)
MCV: 91.5 fL (ref 80.0–100.0)
Platelets: 188 10*3/uL (ref 150–440)
RBC: 3.94 MIL/uL (ref 3.80–5.20)
RDW: 13.6 % (ref 11.5–14.5)
WBC: 8.1 10*3/uL (ref 3.6–11.0)

## 2016-08-01 LAB — PROTIME-INR
INR: 2.61
Prothrombin Time: 28.4 seconds — ABNORMAL HIGH (ref 11.4–15.2)

## 2016-08-01 MED ORDER — ONDANSETRON HCL 4 MG/2ML IJ SOLN: 4.0000 mg | Freq: Four times a day (QID) | INTRAMUSCULAR | Status: DC | PRN

## 2016-08-01 MED ORDER — OXYCODONE HCL 5 MG PO TABS
5.0000 mg | ORAL_TABLET | ORAL | Status: DC | PRN
  Administered 2016-08-02 – 2016-08-03 (×4): 5 mg via ORAL
  Filled 2016-08-01 (×5): qty 1

## 2016-08-01 MED ORDER — FENTANYL CITRATE (PF) 100 MCG/2ML IJ SOLN
25.0000 ug | Freq: Once | INTRAMUSCULAR | Status: AC
  Administered 2016-08-01: 25 ug via INTRAMUSCULAR
  Filled 2016-08-01: qty 2

## 2016-08-01 MED ORDER — LIDOCAINE 5 % EX PTCH
MEDICATED_PATCH | CUTANEOUS | Status: AC
  Filled 2016-08-01: qty 1

## 2016-08-01 MED ORDER — FENTANYL CITRATE (PF) 100 MCG/2ML IJ SOLN
25.0000 ug | Freq: Once | INTRAMUSCULAR | Status: AC
  Administered 2016-08-01: 25 ug via INTRAVENOUS
  Filled 2016-08-01: qty 2

## 2016-08-01 MED ORDER — ACETAMINOPHEN 500 MG PO TABS
1000.0000 mg | ORAL_TABLET | Freq: Once | ORAL | Status: AC
  Administered 2016-08-01: 1000 mg via ORAL
  Filled 2016-08-01: qty 2

## 2016-08-01 MED ORDER — METHOCARBAMOL 500 MG PO TABS
500.0000 mg | ORAL_TABLET | Freq: Three times a day (TID) | ORAL | Status: DC
  Administered 2016-08-01 – 2016-08-03 (×5): 500 mg via ORAL
  Filled 2016-08-01 (×5): qty 1

## 2016-08-01 MED ORDER — ACETAMINOPHEN 500 MG PO TABS: 1000.0000 mg | ORAL_TABLET | Freq: Three times a day (TID) | ORAL | 0 refills | Status: DC

## 2016-08-01 MED ORDER — FENTANYL CITRATE (PF) 100 MCG/2ML IJ SOLN: 25.0000 ug | Freq: Once | INTRAMUSCULAR | Status: DC

## 2016-08-01 MED ORDER — WARFARIN - PHARMACIST DOSING INPATIENT
Freq: Every day | Status: DC
  Filled 2016-08-01 (×5): qty 1

## 2016-08-01 MED ORDER — LIDOCAINE 5 % EX PTCH
1.0000 | MEDICATED_PATCH | CUTANEOUS | Status: DC
  Administered 2016-08-01 – 2016-08-02 (×2): 1 via TRANSDERMAL
  Filled 2016-08-01 (×2): qty 1

## 2016-08-01 MED ORDER — ACETAMINOPHEN 325 MG PO TABS: 650.0000 mg | ORAL_TABLET | Freq: Four times a day (QID) | ORAL | Status: DC | PRN

## 2016-08-01 MED ORDER — BISACODYL 5 MG PO TBEC: 5.0000 mg | DELAYED_RELEASE_TABLET | Freq: Every day | ORAL | Status: DC | PRN

## 2016-08-01 MED ORDER — TRAMADOL HCL 50 MG PO TABS: 50.0000 mg | ORAL_TABLET | Freq: Four times a day (QID) | ORAL | Status: DC | PRN

## 2016-08-01 MED ORDER — SENNA 8.6 MG PO TABS: 1.0000 | ORAL_TABLET | Freq: Every day | ORAL | 0 refills | Status: AC

## 2016-08-01 MED ORDER — FENTANYL CITRATE (PF) 100 MCG/2ML IJ SOLN
25.0000 ug | INTRAMUSCULAR | Status: DC | PRN
  Filled 2016-08-01: qty 2

## 2016-08-01 MED ORDER — WARFARIN SODIUM 3 MG PO TABS
4.0000 mg | ORAL_TABLET | ORAL | Status: DC
  Administered 2016-08-01: 20:00:00 4 mg via ORAL
  Filled 2016-08-01: qty 1

## 2016-08-01 MED ORDER — ONDANSETRON HCL 4 MG PO TABS: 4.0000 mg | ORAL_TABLET | Freq: Four times a day (QID) | ORAL | Status: DC | PRN

## 2016-08-01 MED ORDER — OXYCODONE HCL 5 MG PO TABS
5.0000 mg | ORAL_TABLET | Freq: Once | ORAL | Status: AC
  Administered 2016-08-01: 5 mg via ORAL
  Filled 2016-08-01: qty 1

## 2016-08-01 MED ORDER — DOCUSATE SODIUM 100 MG PO CAPS
100.0000 mg | ORAL_CAPSULE | Freq: Two times a day (BID) | ORAL | Status: DC
  Administered 2016-08-01 – 2016-08-03 (×4): 100 mg via ORAL
  Filled 2016-08-01 (×4): qty 1

## 2016-08-01 MED ORDER — WARFARIN SODIUM 4 MG PO TABS: 4.0000 mg | ORAL_TABLET | ORAL | Status: DC

## 2016-08-01 MED ORDER — ACETAMINOPHEN 500 MG PO TABS
1000.0000 mg | ORAL_TABLET | Freq: Two times a day (BID) | ORAL | Status: DC
  Administered 2016-08-01 – 2016-08-03 (×4): 1000 mg via ORAL
  Filled 2016-08-01 (×4): qty 2

## 2016-08-01 MED ORDER — SENNOSIDES-DOCUSATE SODIUM 8.6-50 MG PO TABS: 1.0000 | ORAL_TABLET | Freq: Every evening | ORAL | Status: DC | PRN

## 2016-08-01 MED ORDER — ACETAMINOPHEN 650 MG RE SUPP: 650.0000 mg | Freq: Four times a day (QID) | RECTAL | Status: DC | PRN

## 2016-08-01 MED ORDER — OXYCODONE HCL 5 MG PO TABS: 5.0000 mg | ORAL_TABLET | Freq: Three times a day (TID) | ORAL | 0 refills | Status: DC | PRN

## 2016-08-01 MED ORDER — WARFARIN SODIUM 2.5 MG PO TABS
2.5000 mg | ORAL_TABLET | ORAL | Status: DC
  Administered 2016-08-02: 2.5 mg via ORAL
  Filled 2016-08-01: qty 1

## 2016-08-01 NOTE — Progress Notes (Addendum)
[name], [name] ([id number]) Visit Report for 07/31/2016 Chief Complaint Document Details Patient Name: [name], [name]. Date of Service: 07/31/2016 1:45 PM Medical Record Number: [id number] Patient Account Number: [id number] Date of Birth/Sex: 1929-11-02 (83 y.o. Female) Treating RN: Afful, RN, BSN, [place] Sink Primary Care Provider: Etheleen Nicks Other Clinician: Referring Provider: Etheleen Nicks Treating Provider/Extender: Rudene Re in Treatment: 6 Information Obtained from: Patient Chief Complaint Patient presents to the wound care center for a consult due non healing wound to the left lower extremity which she's had for about a month but she's had bilateral lower extremity swelling for about 15 years Electronic Signature(s) Signed: 07/31/2016 2:35:28 PM By: Evlyn Kanner MD, FACS Entered By: Evlyn Kanner on 07/31/2016 14:35:28 [name], [name] J. ([id number]) -------------------------------------------------------------------------------- HPI Details Patient Name: [name]. Date of Service: 07/31/2016 1:45 PM Medical Record Number: [id number] Patient Account Number: [id number] Date of Birth/Sex: May 12, 1930 (83 y.o. Female) Treating RN: Clover Mealy, RN, BSN, [place] Sink Primary Care Provider: Etheleen Nicks Other Clinician: Referring Provider: Etheleen Nicks Treating Provider/Extender: Rudene Re in Treatment: 6 History of Present Illness Location: ulcerated area on the lateral part of her left calf Quality: Patient reports experiencing a dull pain to affected area(s). Severity: Patient states wound are getting worse. Duration: Patient has had the wound for < 4 weeks prior to presenting for treatment Timing: Pain in wound is Intermittent (comes and goes Context: The wound appeared gradually over time Modifying Factors: Other treatment(s) tried include:she has been wearing what sounds like lymphedema pumps for the last 12 years or so Associated Signs and Symptoms: Patient  reports having increase swelling. HPI Description: 83 year old patient who comes with bilateral lower extremity edema which she's had for at least 15 years and now an ulcerated area on the left lower extremity which she's had for a month. He was recently seen by the PCP who made a diagnosis of venous stasis ulcer of the left calf and referred her to the wound center. He was placed on clindamycin. Past medical history significant for colon cancer, history of DVT, left tibial fracture in 2012 which required surgery, hyperlipidemia and has been on warfarin for several years. She also gives a history of having lymph pumps and she has been wearing them for about 12 years. She is a former smoker and has quit about 10 years ago. 06/23/2016 -- she has not yet got her venous duplex study done but she did bring her lymphedema pumps and we have checked him out and they seem to be functioning fine 07/03/2016 -- a venous duplex study has not been scheduled for another month and I believe she has an appointment on January 28 07/31/2016 -- the patient continues to have significant lymphedema and she does not have appropriate compression stockings with her at home Electronic Signature(s) Signed: 07/31/2016 2:36:10 PM By: Evlyn Kanner MD, FACS Entered By: Evlyn Kanner on 07/31/2016 14:36:09 [name], [name] Kitchen ([id number]) -------------------------------------------------------------------------------- Physical Exam Details Patient Name: [place], [name]. Date of Service: 07/31/2016 1:45 PM Medical Record Number: [id number] Patient Account Number: [id number] Date of Birth/Sex: [birth year] (83 y.o. Female) Treating RN: Clover Mealy, RN, BSN, [place] Sink Primary Care Provider: Etheleen Nicks Other Clinician: Referring Provider: Etheleen Nicks Treating Provider/Extender: Rudene Re in Treatment: 6 Constitutional . Pulse regular. Respirations normal and unlabored. Afebrile. . Eyes Nonicteric. Reactive to light. Ears,  Nose, Mouth, and Throat Lips, teeth, and gums WNL.Marland Kitchen Moist mucosa without lesions. Neck supple and nontender. No palpable supraclavicular or cervical adenopathy. Normal sized without goiter. Respiratory WNL. No  retractions.. Cardiovascular Pedal Pulses WNL. No clubbing, cyanosis or edema. Lymphatic No adneopathy. No adenopathy. No adenopathy. Musculoskeletal Adexa without tenderness or enlargement.. Digits and nails w/o clubbing, cyanosis, infection, petechiae, ischemia, or inflammatory conditions.. Integumentary (Hair, Skin) No suspicious lesions. No crepitus or fluctuance. No peri-wound warmth or erythema. No masses.Marland Kitchen Psychiatric Judgement and insight Intact.. No evidence of depression, anxiety, or agitation.. Notes the wound is looking much better and is a tiny area open and lymphedema still persistent. Electronic Signature(s) Signed: 07/31/2016 2:36:45 PM By: Evlyn Kanner MD, FACS Entered By: Evlyn Kanner on 07/31/2016 14:36:44 [name], [name] ([id number]) -------------------------------------------------------------------------------- Physician Orders Details Patient Name: [name], [name]. Date of Service: 07/31/2016 1:45 PM Medical Record Number: [id number] Patient Account Number: [id number] Date of Birth/Sex: 16-Aug-1929 (83 y.o. Female) Treating RN: Huel Coventry Primary Care Provider: Etheleen Nicks Other Clinician: Referring Provider: Etheleen Nicks Treating Provider/Extender: Rudene Re in Treatment: 6 Verbal / Phone Orders: No Diagnosis Coding Wound Cleansing Wound #1 Left,Proximal,Lateral Lower Leg o Clean wound with Normal Saline. Wound #2 Left,Distal,Lateral Lower Leg o Clean wound with Normal Saline. Anesthetic Wound #1 Left,Proximal,Lateral Lower Leg o Topical Lidocaine 4% cream applied to wound bed prior to debridement Wound #2 Left,Distal,Lateral Lower Leg o Topical Lidocaine 4% cream applied to wound bed prior to debridement Primary Wound  Dressing Wound #1 Left,Proximal,Lateral Lower Leg o ABD Pad Wound #2 Left,Distal,Lateral Lower Leg o ABD Pad Dressing Change Frequency Wound #1 Left,Proximal,Lateral Lower Leg o Change dressing every week Wound #2 Left,Distal,Lateral Lower Leg o Change dressing every week Follow-up Appointments Wound #1 Left,Proximal,Lateral Lower Leg o Return Appointment in 1 week. Wound #2 Left,Distal,Lateral Lower Leg o Return Appointment in 1 week. Edema Control [place], Kamille J. ([id number]) Wound #1 Left,Proximal,Lateral Lower Leg o 3 Layer Compression System - Left Lower Extremity Wound #2 Left,Distal,Lateral Lower Leg o 3 Layer Compression System - Left Lower Extremity Additional Orders / Instructions Wound #1 Left,Proximal,Lateral Lower Leg o Increase protein intake. o Activity as tolerated Wound #2 Left,Distal,Lateral Lower Leg o Increase protein intake. o Activity as tolerated Notes Compression stockings: ankle 26.5, 43 calf, 40 to knee Electronic Signature(s) Signed: 07/31/2016 3:30:03 PM By: Evlyn Kanner MD, FACS Signed: 07/31/2016 3:30:03 PM By: Elliot Gurney RN, BSN, Kim RN, BSN Entered By: Elliot Gurney, RN, BSN, Kim on 07/31/2016 14:23:36 [name], [name] ([id number]) -------------------------------------------------------------------------------- Problem List Details Patient Name: DEAH, [place]. Date of Service: 07/31/2016 1:45 PM Medical Record Number: [id number] Patient Account Number: [id number] Date of Birth/Sex: [birth year] (83 y.o. Female) Treating RN: Clover Mealy, RN, BSN, [place] Sink Primary Care Provider: Etheleen Nicks Other Clinician: Referring Provider: Etheleen Nicks Treating Provider/Extender: Rudene Re in Treatment: 6 Active Problems ICD-10 Encounter Code Description Active Date Diagnosis I89.0 Lymphedema, not elsewhere classified 06/13/2016 Yes L97.222 Non-pressure chronic ulcer of left calf with fat layer 06/13/2016 Yes exposed I80.203 Phlebitis and  thrombophlebitis of unspecified deep 06/13/2016 Yes vessels of lower extremities, bilateral Inactive Problems Resolved Problems Electronic Signature(s) Signed: 07/31/2016 2:35:17 PM By: Evlyn Kanner MD, FACS Entered By: Evlyn Kanner on 07/31/2016 14:35:17 [name], [name] J. ([id number]) -------------------------------------------------------------------------------- Progress Note Details Patient Name: [place], [name]. Date of Service: 07/31/2016 1:45 PM Medical Record Number: [id number] Patient Account Number: [id number] Date of Birth/Sex: January 25, 1930 (83 y.o. Female) Treating RN: Huel Coventry Primary Care Provider: Etheleen Nicks Other Clinician: Referring Provider: Etheleen Nicks Treating Provider/Extender: Rudene Re in Treatment: 6 Subjective Chief Complaint Information obtained from Patient Patient presents to the wound care center for a consult due non healing wound to the left  lower extremity which she's had for about a month but she's had bilateral lower extremity swelling for about 15 years History of Present Illness (HPI) The following HPI elements were documented for the patient's wound: Location: ulcerated area on the lateral part of her left calf Quality: Patient reports experiencing a dull pain to affected area(s). Severity: Patient states wound are getting worse. Duration: Patient has had the wound for < 4 weeks prior to presenting for treatment Timing: Pain in wound is Intermittent (comes and goes Context: The wound appeared gradually over time Modifying Factors: Other treatment(s) tried include:she has been wearing what sounds like lymphedema pumps for the last 12 years or so Associated Signs and Symptoms: Patient reports having increase swelling. 83 year old patient who comes with bilateral lower extremity edema which she's had for at least 15 years and now an ulcerated area on the left lower extremity which she's had for a month. He was recently seen by the PCP  who made a diagnosis of venous stasis ulcer of the left calf and referred her to the wound center. He was placed on clindamycin. Past medical history significant for colon cancer, history of DVT, left tibial fracture in 2012 which required surgery, hyperlipidemia and has been on warfarin for several years. She also gives a history of having lymph pumps and she has been wearing them for about 12 years. She is a former smoker and has quit about 10 years ago. 06/23/2016 -- she has not yet got her venous duplex study done but she did bring her lymphedema pumps and we have checked him out and they seem to be functioning fine 07/03/2016 -- a venous duplex study has not been scheduled for another month and I believe she has an appointment on January 28 07/31/2016 -- the patient continues to have significant lymphedema and she does not have appropriate compression stockings with her at home Objective [name], [name] J. ([id number]) Constitutional Pulse regular. Respirations normal and unlabored. Afebrile. Vitals Time Taken: 1:47 PM, Height: 66 in, Weight: 221 lbs, BMI: 35.7, Temperature: 97.5 F, Pulse: 69 bpm, Respiratory Rate: 18 breaths/min, Blood Pressure: 171/56 mmHg. Eyes Nonicteric. Reactive to light. Ears, Nose, Mouth, and Throat Lips, teeth, and gums WNL.Marland Kitchen. Moist mucosa without lesions. Neck supple and nontender. No palpable supraclavicular or cervical adenopathy. Normal sized without goiter. Respiratory WNL. No retractions.. Cardiovascular Pedal Pulses WNL. No clubbing, cyanosis or edema. Lymphatic No adneopathy. No adenopathy. No adenopathy. Musculoskeletal Adexa without tenderness or enlargement.. Digits and nails w/o clubbing, cyanosis, infection, petechiae, ischemia, or inflammatory conditions.Marland Kitchen. Psychiatric Judgement and insight Intact.. No evidence of depression, anxiety, or agitation.. General Notes: the wound is looking much better and is a tiny area open and lymphedema still  persistent. Integumentary (Hair, Skin) No suspicious lesions. No crepitus or fluctuance. No peri-wound warmth or erythema. No masses.. Wound #1 status is Open. Original cause of wound was Gradually Appeared. The wound is located on the Left,Proximal,Lateral Lower Leg. The wound measures 0.1cm length x 0.1cm width x 0.1cm depth; 0.008cm^2 area and 0.001cm^3 volume. The wound is limited to skin breakdown. There is no tunneling or undermining noted. There is a small amount of serosanguineous drainage noted. The wound margin is flat and intact. There is no granulation within the wound bed. There is no necrotic tissue within the wound bed. The periwound skin appearance did not exhibit: Callus, Crepitus, Excoriation, Induration, Rash, Scarring, Dry/Scaly, Maceration, Atrophie Blanche, Cyanosis, Ecchymosis, Hemosiderin Staining, Mottled, Pallor, Rubor, Erythema. Periwound temperature was noted as No Abnormality. Wound #2  status is Open. Original cause of wound was Gradually Appeared. The wound is located on the Left,Distal,Lateral Lower Leg. The wound measures 0.1cm length x 0.1cm width x 0.1cm depth; 0.008cm^2 area and 0.001cm^3 volume. The wound is limited to skin breakdown. There is no tunneling or undermining [name], [name] J. ([id number]) noted. There is a none present amount of drainage noted. The wound margin is flat and intact. There is no granulation within the wound bed. There is no necrotic tissue within the wound bed. The periwound skin appearance exhibited: Dry/Scaly. The periwound skin appearance did not exhibit: Callus, Crepitus, Excoriation, Induration, Rash, Scarring, Maceration, Atrophie Blanche, Cyanosis, Ecchymosis, Hemosiderin Staining, Mottled, Pallor, Rubor, Erythema. Periwound temperature was noted as No Abnormality. Assessment Active Problems ICD-10 I89.0 - Lymphedema, not elsewhere classified L97.222 - Non-pressure chronic ulcer of left calf with fat layer exposed I80.203 -  Phlebitis and thrombophlebitis of unspecified deep vessels of lower extremities, bilateral Procedures Wound #1 Wound #1 is a Trauma, Other located on the Left,Proximal,Lateral Lower Leg . There was a Three Layer Compression Therapy Procedure with a pre-treatment ABI of 0.9 by Huel Coventry, RN. Post procedure Diagnosis Wound #1: Same as Pre-Procedure Plan Wound Cleansing: Wound #1 Left,Proximal,Lateral Lower Leg: Clean wound with Normal Saline. Wound #2 Left,Distal,Lateral Lower Leg: Clean wound with Normal Saline. Anesthetic: Wound #1 Left,Proximal,Lateral Lower Leg: Topical Lidocaine 4% cream applied to wound bed prior to debridement Wound #2 Left,Distal,Lateral Lower Leg: Topical Lidocaine 4% cream applied to wound bed prior to debridement Primary Wound Dressing: Wound #1 Left,Proximal,Lateral Lower Leg: [place], Arriel J. ([id number]) ABD Pad Wound #2 Left,Distal,Lateral Lower Leg: ABD Pad Dressing Change Frequency: Wound #1 Left,Proximal,Lateral Lower Leg: Change dressing every week Wound #2 Left,Distal,Lateral Lower Leg: Change dressing every week Follow-up Appointments: Wound #1 Left,Proximal,Lateral Lower Leg: Return Appointment in 1 week. Wound #2 Left,Distal,Lateral Lower Leg: Return Appointment in 1 week. Edema Control: Wound #1 Left,Proximal,Lateral Lower Leg: 3 Layer Compression System - Left Lower Extremity Wound #2 Left,Distal,Lateral Lower Leg: 3 Layer Compression System - Left Lower Extremity Additional Orders / Instructions: Wound #1 Left,Proximal,Lateral Lower Leg: Increase protein intake. Activity as tolerated Wound #2 Left,Distal,Lateral Lower Leg: Increase protein intake. Activity as tolerated General Notes: Compression stockings: ankle 26.5, 43 calf, 40 to knee The patient does not have adequate compression stockings and we will order some for her, in anticipation of her discharge soon. In the meanwhile we will treat her with appropriate 3 layer Profore  compression and see her back next week with her venous duplex study reports. Electronic Signature(s) Signed: 08/01/2016 4:12:12 PM By: Evlyn Kanner MD, FACS Signed: 08/01/2016 4:45:13 PM By: Elliot Gurney RN, BSN, Kim RN, BSN Previous Signature: 07/31/2016 2:39:44 PM Version By: Evlyn Kanner MD, FACS Entered By: Elliot Gurney RN, BSN, Kim on 08/01/2016 08:08:07 [name], [name] ([id number]) -------------------------------------------------------------------------------- SuperBill Details Patient Name: [name], LISZEWSKI. Date of Service: 07/31/2016 Medical Record Number: [id number] Patient Account Number: [id number] Date of Birth/Sex: 03-02-1930 (83 y.o. Female) Treating RN: Huel Coventry Primary Care Provider: Etheleen Nicks Other Clinician: Referring Provider: Etheleen Nicks Treating Provider/Extender: Rudene Re in Treatment: 6 Diagnosis Coding ICD-10 Codes Code Description I89.0 Lymphedema, not elsewhere classified L97.222 Non-pressure chronic ulcer of left calf with fat layer exposed I80.203 Phlebitis and thrombophlebitis of unspecified deep vessels of lower extremities, bilateral Facility Procedures CPT4: Description Modifier Quantity Code [id number] (Facility Use Only) 32440NU - APPLY MULTLAY COMPRS LWR RT 1 LEG Physician Procedures CPT4: Description Modifier Quantity Code [id number] 99213 - WC PHYS LEVEL 3 - EST PT  1 ICD-10 Description Diagnosis I89.0 Lymphedema, not elsewhere classified L97.222 Non-pressure chronic ulcer of left calf with fat layer exposed I80.203 Phlebitis and  thrombophlebitis of unspecified deep vessels of lower extremities, bilateral Electronic Signature(s) Signed: 07/31/2016 2:39:54 PM By: Evlyn Kanner MD, FACS Entered By: Evlyn Kanner on 07/31/2016 14:39:54

## 2016-08-01 NOTE — H&P (Signed)
[facility] - [place] at [facility]   PATIENT NAME: [name]    MR#:  [id number]  DATE OF BIRTH:  1929-07-14  DATE OF ADMISSION:  08/01/2016  PRIMARY CARE PHYSICIAN: Etheleen Nicks, NP   REQUESTING/REFERRING PHYSICIAN: Dr Don Perking  CHIEF COMPLAINT:  Lower back pain and right hip pain after a fall at home  HISTORY OF PRESENT ILLNESS:  [name]  is a 83 y.o. female with a known history of Recurrent DVT now on oral anticoagulation, degenerative spine comes to the emergency room after she tried to get up from her chair to pick up a can which is lying on the floor and slid from her chair and landed up on her right hip. She felt a jolt-like pain going to her back. Patient cannot offer that came to the emergency room where CT lumbar spine, x-rays of the hip is negative for any fractures. Patient has had old thoracic spine compression fractures. She also has osteopenia noted on the x-rays. Patient had several rounds of IV and oral pain medication and is not able to walk. She is being admitted for intractable pain following a mechanical fall. Patient denies any loss of consciousness, headache nausea or vomiting she denies any bowel or urinary incontinence.  PAST MEDICAL HISTORY:  History reviewed. No pertinent past medical history.  PAST SURGICAL HISTOIRY:  No past surgical history on file.  SOCIAL HISTORY:   Social History  Substance Use Topics  . Smoking status: Not on file  . Smokeless tobacco: Not on file  . Alcohol use Not on file    FAMILY HISTORY:  History reviewed. No pertinent family history.  DRUG ALLERGIES:   Allergies  Allergen Reactions  . Aspirin Nausea Only    Abdominal pain    REVIEW OF SYSTEMS:  Review of Systems  Constitutional: Negative for chills, fever and weight loss.  HENT: Negative for ear discharge, ear pain and nosebleeds.   Eyes: Negative for blurred vision, pain and discharge.  Respiratory: Negative for sputum  production, shortness of breath, wheezing and stridor.   Cardiovascular: Negative for chest pain, palpitations, orthopnea and PND.  Gastrointestinal: Negative for abdominal pain, diarrhea, nausea and vomiting.  Genitourinary: Negative for frequency and urgency.  Musculoskeletal: Positive for back pain and joint pain.  Neurological: Positive for weakness. Negative for sensory change, speech change and focal weakness.  Psychiatric/Behavioral: Negative for depression and hallucinations. The patient is not nervous/anxious.      MEDICATIONS AT HOME:   Prior to Admission medications   Medication Sig Start Date End Date Taking? Authorizing Provider  acetaminophen (TYLENOL) 500 MG tablet Take 1,000 mg by mouth 2 (two) times daily.   Yes Historical Provider, MD  OVER THE COUNTER MEDICATION Take 1 tablet by mouth 2 (two) times daily. otc eye multivitamin   Yes Historical Provider, MD  PRESCRIPTION MEDICATION Take 1 Dose by mouth daily. "pill for her nerves" (see notes)   Yes Historical Provider, MD  warfarin (COUMADIN) 4 MG tablet Take 4 mg by mouth daily.   Yes Historical Provider, MD  warfarin (COUMADIN) 5 MG tablet Take 2.5 mg by mouth every evening.   Yes Historical Provider, MD  acetaminophen (TYLENOL) 500 MG tablet Take 2 tablets (1,000 mg total) by mouth every 8 (eight) hours. 08/01/16 08/05/16  Nita Sickle, MD  oxyCODONE (ROXICODONE) 5 MG immediate release tablet Take 1 tablet (5 mg total) by mouth every 8 (eight) hours as needed. 08/01/16 08/01/17  Nita Sickle, MD  senna [facility])  8.6 MG TABS tablet Take 1 tablet (8.6 mg total) by mouth at bedtime. 08/01/16   Nita Sickle, MD      VITAL SIGNS:  Blood pressure (!) 155/57, pulse 62, temperature 98.6 F (37 C), temperature source Oral, resp. rate 16, height 5\' 6"  (1.676 m), weight 113.4 kg (250 lb), SpO2 96 %.  PHYSICAL EXAMINATION:  GENERAL:  83 y.o.-year-old patient lying in the bed with no acute distress. Obese EYES: Pupils  equal, round, reactive to light and accommodation. No scleral icterus. Extraocular muscles intact.  HEENT: Head atraumatic, normocephalic. Oropharynx and nasopharynx clear.  NECK:  Supple, no jugular venous distention. No thyroid enlargement, no tenderness.  LUNGS: Normal breath sounds bilaterally, no wheezing, rales,rhonchi or crepitation. No use of accessory muscles of respiration.  CARDIOVASCULAR: S1, S2 normal. No murmurs, rubs, or gallops.  ABDOMEN: Soft, nontender, nondistended. Bowel sounds present. No organomegaly or mass.  EXTREMITIES: No pedal edema, cyanosis, or clubbing.  NEUROLOGIC: Cranial nerves II through XII are intact. Muscle strength 5/5 in all extremities. Sensation intact. Gait not checked.  PSYCHIATRIC: The patient is alert and oriented x 3.  SKIN: No obvious rash, lesion, or ulcer.   LABORATORY PANEL:   CBC No results for input(s): WBC, HGB, HCT, PLT in the last 168 hours. ------------------------------------------------------------------------------------------------------------------  Chemistries  No results for input(s): NA, K, CL, CO2, GLUCOSE, BUN, CREATININE, CALCIUM, MG, AST, ALT, ALKPHOS, BILITOT in the last 168 hours.  Invalid input(s): GFRCGP ------------------------------------------------------------------------------------------------------------------  Cardiac Enzymes No results for input(s): TROPONINI in the last 168 hours. ------------------------------------------------------------------------------------------------------------------  RADIOLOGY:  Dg Lumbar Spine Complete  Result Date: 08/01/2016 CLINICAL DATA:  Fall. EXAM: LUMBAR SPINE - COMPLETE 4+ VIEW COMPARISON:  12/09/2009 CT 06/14/2007. FINDINGS: Diffuse osteopenia and degenerative change. No acute bony abnormality identified. Aortic atherosclerotic vascular calcification. Aortic ectasia 3.1 cm. Aortic ultrasound should be considered for further evaluation to exclude aneurysm. IMPRESSION:  1. Diffuse osteopenia hand degenerative change. Multiple thoracic and upper lumbar compression fractures. Age undetermined. 2. Aortoiliac atherosclerotic vascular disease. Aortic ectasia 3.1 cm. Aortic ultrasound should be considered for further evaluation to exclude aneurysm . Electronically Signed   By: Maisie Fus  Register   On: 08/01/2016 13:20   Ct Head Wo Contrast  Result Date: 08/01/2016 CLINICAL DATA:  Fall with left hip pain.  Initial encounter. EXAM: CT HEAD WITHOUT CONTRAST TECHNIQUE: Contiguous axial images were obtained from the base of the skull through the vertex without intravenous contrast. COMPARISON:  04/09/2012 FINDINGS: Brain: No evidence of acute infarction, hemorrhage, hydrocephalus, extra-axial collection or mass lesion/mass effect. Chronic microvascular disease with confluent ischemic gliosis in the periventricular white matter, mild for age. Central volume loss with mild ventriculomegaly. Vascular: Atherosclerotic calcification.  No hyperdense vessel. Skull: Negative for fracture Sinuses/Orbits: Left cataract resection.  No posttraumatic finding. IMPRESSION: No evidence of intracranial injury. Electronically Signed   By: Marnee Spring M.D.   On: 08/01/2016 13:02   Ct Lumbar Spine Wo Contrast  Result Date: 08/01/2016 CLINICAL DATA:  83 y/o F; age-indeterminate compression deformities of thoracic and lumbar spine. EXAM: CT LUMBAR SPINE WITHOUT CONTRAST TECHNIQUE: Multidetector CT imaging of the lumbar spine was performed without intravenous contrast administration. Multiplanar CT image reconstructions were also generated. COMPARISON:  08/01/2016 lumbar radiographs FINDINGS: Segmentation: 5 lumbar type vertebrae. Alignment: Normal. Vertebrae: Minimal loss of height of T12 vertebral body with chronic appearance. No significant loss of height of the lumbar vertebral bodies. Suspected upper lumbar compression deformities on prior radiographs is probably due projectional artifact from  patient positioning. Paraspinal  and other soft tissues: Severe calcific atherosclerosis of the abdominal aorta with focal ectasia up to 28 mm. Disc levels: Multilevel discogenic degenerative changes with marginal osteophytes and mild loss of disc space height greatest at L4-5. Facet arthrosis pronounced at L3 through S1. Articulation of the L2 through S1 spinous processes with productive changes compatible with Baastrup's disease. Disc, facet, and ligamentum flavum disease at multiple levels resulting in canal stenosis greatest at L3-4 where it is probably moderate. IMPRESSION: 1. Minimal loss of height of T12 vertebral body with chronic appearance. No significant loss of height of the lumbar vertebral bodies. Suspected upper lumbar compression deformities on prior radiographs is probably due projectional artifact from patient positioning. 2. Moderate lumbar spondylosis with discogenic and facet degenerative changes as well as lower lumbar Baastrup's disease. 3. Aortic atherosclerosis. Ectatic aortic measuring 28 mm. Ectatic abdominal aorta at risk for aneurysm development. Recommend followup by ultrasound in 5 years. This recommendation follows ACR consensus guidelines: White Paper of the ACR Incidental Findings Committee II on Vascular Findings. J Am Coll Radiol 2013; 10:789-794. Electronically Signed   By: Mitzi Hansen M.D.   On: 08/01/2016 16:18   Dg Knee Complete 4 Views Left  Result Date: 08/01/2016 CLINICAL DATA:  Patient fell forward while picking up something, landing on her left hip. Patient c/o left hip pain. Ems denies shortening and rotation. Denies LOC EXAM: LEFT KNEE - COMPLETE 4+ VIEW COMPARISON:  04/11/2012 FINDINGS: No acute fracture. There has been a previous ORIF of a comminuted proximal tibial fracture with a lateral fixation plate and multiple screws. The orthopedic hardware appears well-seated and stable from the prior exam. There is residual fracture deformity, but the  fracture has healed. There is moderate medial compartment joint space narrowing with milder patellofemoral and lateral compartment narrowing. Marginal osteophytes project from all 3 compartments. No bone lesions.  Bones are demineralized. No joint effusion. There are scattered posterior vascular calcifications. IMPRESSION: 1. No acute fracture or dislocation. 2. Status post ORIF of a comminuted proximal tibial fracture. Orthopedic hardware is well-seated with no evidence of loosening. Electronically Signed   By: Amie Portland M.D.   On: 08/01/2016 13:18   Dg Hip Unilat With Pelvis 2-3 Views Left  Result Date: 08/01/2016 CLINICAL DATA:  Patient fell forward while picking up something, landing on her left hip. Patient c/o left hip pain. Ems denies shortening and rotation. Denies LOC EXAM: DG HIP (WITH OR WITHOUT PELVIS) 2-3V LEFT COMPARISON:  None. FINDINGS: No fracture.  No bone lesion. Hip joints, SI joints and symphysis pubis are normally spaced and aligned. No significant arthropathic change. Bones are diffusely demineralized. Soft tissues are unremarkable. IMPRESSION: No fracture or significant hip joint abnormality. Electronically Signed   By: Amie Portland M.D.   On: 08/01/2016 13:19    EKG:    IMPRESSION AND PLAN:   [name]  is a 83 y.o. female with a known history of Recurrent DVT now on oral anticoagulation, degenerative spine comes to the emergency room after she tried to get up from her chair to pick up a can which is lying on the floor and slid from her chair and landed up on her right hip. She felt a jolt-like pain going to her back.  1. Intractable back pain following a mechanical fall -Appears lumbosacral strain following fall. CT lumbar spine does not show any fracture. Patient has had old thoracic compression fractures. -Hip x-rays shows DJD and osteopenia no fracture -Admitted for pain control -IV fentanyl, lidocaine patch,  oral oxycodone. -Robaxin for muscle relaxant -K  pad -Physical therapy  2. History of DVT, recurrent -Resume Coumadin -Pharmacy consulted for Coumadin management  All the records are reviewed and case discussed with ED provider. Management plans discussed with the patient, family and they are in agreement.  CODE STATUS: DO NOT RESUSCITATE  TOTAL TIME TAKING CARE OF THIS PATIENT: 45 minutes.    [name] M.D on 08/01/2016 at 5:11 PM  Between 7am to 6pm - Pager - [phone]  After 6pm go to [email or web address] - password [email or web address] [email or web address]  [facility]  Office  [phone]  CC: Primary care physician; Etheleen NicksKeri MacDonald, NP

## 2016-08-01 NOTE — Consult Note (Signed)
ANTICOAGULATION CONSULT NOTE - Initial Consult  Pharmacy Consult for warfarin Indication: hx of DVT  Allergies  Allergen Reactions  . Aspirin Nausea Only    Abdominal pain    Patient Measurements: Height: 5\' 6"  (167.6 cm) Weight: 250 lb (113.4 kg) IBW/kg (Calculated) : 59.3 Heparin Dosing Weight:   Vital Signs: Temp: 98.6 F (37 C) (01/26 1221) Temp Source: Oral (01/26 1221) BP: 119/68 (01/26 1730) Pulse Rate: 60 (01/26 1730)  Labs:  Recent Labs  08/01/16 1726  HGB 12.0  HCT 36.1  PLT 188  LABPROT 28.4*  INR 2.61    CrCl cannot be calculated (Patient's most recent lab result is older than the maximum 21 days allowed.).   Medical History: History reviewed. No pertinent past medical history.  Medications:  Scheduled:  . lidocaine  1 patch Transdermal Q24H  . methocarbamol  500 mg Oral TID    Assessment: Pt is a 83 year old female who presents s/p fall. Scans neg for any fracture and no bleeding reported. CBC WNL. Pt takes warfarin at home for hx of DVT. Pt was seen at anticoag clinic yesterday where her INR was elevated at 3.3. She was instructed to hold one dose and recheck in 2 weeks. INR today on admission is therapeutic at 2.61. Pt home dose is 4mg  MWF and 2.5mg  all other days.   Goal of Therapy:  INR 2-3 Monitor platelets by anticoagulation protocol: Yes   Plan:  Resume pt home dose and recheck INR in the AM.  Olene FlossMelissa D Brailon Don, Pharm.D, BCPS Clinical Pharmacist  08/01/2016,6:53 PM

## 2016-08-01 NOTE — ED Notes (Signed)
Pt repositioned

## 2016-08-01 NOTE — Progress Notes (Signed)
While visiting with Pt's in Ed, [facility] Pt alert and communicative. CH was about to go and bring the family members in when one of the family member arrived. CH stayed around for a while and left because he was not needed.    08/01/16 1500  Clinical Encounter Type  Visited With Patient;Family  Visit Type Initial;Spiritual support  Referral From Nurse  Consult/Referral To Chaplain  Spiritual Encounters  Spiritual Needs Prayer;Grief support;Other (Comment)

## 2016-08-01 NOTE — Progress Notes (Addendum)
[name], [name] ([id number]) Visit Report for 07/31/2016 Arrival Information Details Patient Name: [place], [name] Date of Service: 07/31/2016 1:45 PM Medical Record Number: [id number] Patient Account Number: [id number] Date of Birth/Sex: [birth year] (83 y.o. Female) Treating RN: Huel Coventry Primary Care Ghali Morissette: Etheleen Nicks Other Clinician: Referring Deveion Denz: Etheleen Nicks Treating Mishal Probert/Extender: Rudene Re in Treatment: 6 Visit Information History Since Last Visit Added or deleted any medications: No Patient Arrived: Ambulatory Any new allergies or adverse reactions: No Arrival Time: 13:46 Had a fall or experienced change in No Accompanied By: self activities of daily living that may affect Transfer Assistance: None risk of falls: Patient Identification Verified: Yes Signs or symptoms of abuse/neglect since last No Patient Requires Transmission- No visito Based Precautions: Hospitalized since last visit: No Patient Has Alerts: Yes Has Dressing in Place as Prescribed: Yes Patient Alerts: Patient on Blood Has Compression in Place as Prescribed: Yes Thinner Pain Present Now: No Warfarin Electronic Signature(s) Signed: 07/31/2016 3:30:03 PM By: Elliot Gurney, RN, BSN, Kim RN, BSN Entered By: Elliot Gurney, RN, BSN, Kim on 07/31/2016 13:47:07 Dorantes, [name] ([id number]) -------------------------------------------------------------------------------- Compression Therapy Details Patient Name: [name]. Date of Service: 07/31/2016 1:45 PM Medical Record Number: [id number] Patient Account Number: [id number] Date of Birth/Sex: 02-18-1930 (83 y.o. Female) Treating RN: Huel Coventry Primary Care Kenneshia Rehm: Etheleen Nicks Other Clinician: Referring Eddith Mentor: Etheleen Nicks Treating Saylah Ketner/Extender: Rudene Re in Treatment: 6 Compression Therapy Performed for Wound Wound #1 Left,Proximal,Lateral Lower Leg Assessment: Performed By: Clinician Huel Coventry, RN Compression  Type: Three Layer Pre Treatment ABI: 0.9 Post Procedure Diagnosis Same as Pre-procedure Electronic Signature(s) Signed: 07/31/2016 3:30:03 PM By: Elliot Gurney, RN, BSN, Kim RN, BSN Entered By: Elliot Gurney, RN, BSN, Kim on 07/31/2016 14:24:04 Ohm, [name] ([id number]) -------------------------------------------------------------------------------- Encounter Discharge Information Details Patient Name: [place], [name] Date of Service: 07/31/2016 1:45 PM Medical Record Number: [id number] Patient Account Number: [id number] Date of Birth/Sex: 1929-10-16 (83 y.o. Female) Treating RN: Huel Coventry Primary Care Arley Garant: Etheleen Nicks Other Clinician: Referring Reya Aurich: Etheleen Nicks Treating Abby Stines/Extender: Rudene Re in Treatment: 6 Encounter Discharge Information Items Discharge Pain Level: 0 Discharge Condition: Stable Ambulatory Status: Ambulatory Discharge Destination: Home Transportation: Private Auto Accompanied By: self Schedule Follow-up Appointment: Yes Medication Reconciliation completed and provided to Patient/Care Yes Binta Statzer: Provided on Clinical Summary of Care: 07/31/2016 Form Type Recipient Paper Patient IS Electronic Signature(s) Signed: 07/31/2016 2:31:51 PM By: Gwenlyn Perking Entered By: Gwenlyn Perking on 07/31/2016 14:31:50 Gellerman, [name] J. ([id number]) -------------------------------------------------------------------------------- Lower Extremity Assessment Details Patient Name: [place], [name]. Date of Service: 07/31/2016 1:45 PM Medical Record Number: [id number] Patient Account Number: [id number] Date of Birth/Sex: 06-26-1930 (83 y.o. Female) Treating RN: Huel Coventry Primary Care Jamiria Langill: Etheleen Nicks Other Clinician: Referring Sumedha Munnerlyn: Etheleen Nicks Treating Zamaria Brazzle/Extender: Rudene Re in Treatment: 6 Vascular Assessment Claudication: Claudication Assessment [Left:None] Pulses: Dorsalis Pedis Palpable: [Left:Yes] Posterior  Tibial Extremity colors, hair growth, and conditions: Extremity Color: [Left:Mottled] Hair Growth on Extremity: [Left:No] Temperature of Extremity: [Left:Warm] Capillary Refill: [Left:< 3 seconds] Dependent Rubor: [Left:No] Blanched when Elevated: [Left:No] Lipodermatosclerosis: [Left:No] Toe Nail Assessment Left: Right: Thick: Yes Discolored: Yes Deformed: No Improper Length and Hygiene: No Electronic Signature(s) Signed: 07/31/2016 3:30:03 PM By: Elliot Gurney, RN, BSN, Kim RN, BSN Entered By: Elliot Gurney, RN, BSN, Kim on 07/31/2016 14:03:49 [place], [name] ([id number]) -------------------------------------------------------------------------------- Multi Wound Chart Details Patient Name: [place], [name]. Date of Service: 07/31/2016 1:45 PM Medical Record Number: [id number] Patient Account Number: [id number] Date of Birth/Sex: 28-Aug-1929 (83 y.o. Female) Treating RN: Huel Coventry Primary  Care Dantre Yearwood: Etheleen Nicks Other Clinician: Referring Verna Hamon: Etheleen Nicks Treating Yohance Hathorne/Extender: Rudene Re in Treatment: 6 Vital Signs Height(in): 66 Pulse(bpm): 69 Weight(lbs): 221 Blood Pressure 171/56 (mmHg): Body Mass Index(BMI): 36 Temperature(F): 97.5 Respiratory Rate 18 (breaths/min): Photos: [1:No Photos] [2:No Photos] [N/A:N/A] Wound Location: [1:Left Lower Leg - Lateral, Proximal] [2:Left Lower Leg - Lateral, Distal] [N/A:N/A] Wounding Event: [1:Gradually Appeared] [2:Gradually Appeared] [N/A:N/A] Primary Etiology: [1:Trauma, Other] [2:Lymphedema] [N/A:N/A] Secondary Etiology: [1:Lymphedema] [2:Venous Leg Ulcer] [N/A:N/A] Comorbid History: [1:Lymphedema, Asthma, Deep Vein Thrombosis, Received Chemotherapy, Received Radiation] [2:Lymphedema, Asthma, Deep Vein Thrombosis, Received Chemotherapy, Received Radiation] [N/A:N/A] Date Acquired: [1:05/14/2016] [2:05/14/2016] [N/A:N/A] Weeks of Treatment: [1:6] [2:6] [N/A:N/A] Wound Status: [1:Open] [2:Open]  [N/A:N/A] Measurements L x W x D 0.1x0.1x0.1 [2:0.1x0.1x0.1] [N/A:N/A] (cm) Area (cm) : [1:0.008] [2:0.008] [N/A:N/A] Volume (cm) : [1:0.001] [2:0.001] [N/A:N/A] % Reduction in Area: [1:99.20%] [2:99.60%] [N/A:N/A] % Reduction in Volume: 98.90% [2:99.80%] [N/A:N/A] Classification: [1:Full Thickness Without Exposed Support Structures] [2:Full Thickness Without Exposed Support Structures] [N/A:N/A] Exudate Amount: [1:Small] [2:None Present] [N/A:N/A] Exudate Type: [1:Serosanguineous] [2:N/A] [N/A:N/A] Exudate Color: [1:red, brown] [2:N/A] [N/A:N/A] Wound Margin: [1:Flat and Intact] [2:Flat and Intact] [N/A:N/A] Granulation Amount: [1:None Present (0%)] [2:None Present (0%)] [N/A:N/A] Necrotic Amount: [1:None Present (0%)] [2:None Present (0%)] [N/A:N/A] Exposed Structures: [1:Fascia: No Fat Layer (Subcutaneous] [2:Fascia: No Fat Layer (Subcutaneous] [N/A:N/A] Tissue) Exposed: No Tissue) Exposed: No Tendon: No Tendon: No Muscle: No Muscle: No Joint: No Joint: No Bone: No Bone: No Limited to Skin Limited to Skin Breakdown Breakdown Epithelialization: Large (67-100%) Large (67-100%) N/A Periwound Skin Texture: Excoriation: No Excoriation: No N/A Induration: No Induration: No Callus: No Callus: No Crepitus: No Crepitus: No Rash: No Rash: No Scarring: No Scarring: No Periwound Skin Maceration: No Dry/Scaly: Yes N/A Moisture: Dry/Scaly: No Maceration: No Periwound Skin Color: Atrophie Blanche: No Atrophie Blanche: No N/A Cyanosis: No Cyanosis: No Ecchymosis: No Ecchymosis: No Erythema: No Erythema: No Hemosiderin Staining: No Hemosiderin Staining: No Mottled: No Mottled: No Pallor: No Pallor: No Rubor: No Rubor: No Temperature: No Abnormality No Abnormality N/A Tenderness on No No N/A Palpation: Wound Preparation: Ulcer Cleansing: Other: Ulcer Cleansing: Other: N/A soap and water soap and water Topical Anesthetic Topical Anesthetic Applied:  None Applied: None, Other: lidocaine 4% Procedures Performed: Compression Therapy N/A N/A Treatment Notes Wound #1 (Left, Proximal, Lateral Lower Leg) 1. Cleansed with: Cleanse wound with antibacterial soap and water 3. Peri-wound Care: Moisturizing lotion 5. Secondary Dressing Applied ABD Pad 7. Secured with 3 Layer Compression System - Left Lower Extremity Wound #2 (Left, Distal, Lateral Lower Leg) 1. Cleansed with: Cleanse wound with antibacterial soap and water Alva, Amoy J. ([id number]) 3. Peri-wound Care: Moisturizing lotion 5. Secondary Dressing Applied ABD Pad 7. Secured with 3 Layer Compression System - Left Lower Extremity Electronic Signature(s) Signed: 07/31/2016 2:35:22 PM By: Evlyn Kanner MD, FACS Entered By: Evlyn Kanner on 07/31/2016 14:35:21 Ponder, Saralynn [name] ([id number]) -------------------------------------------------------------------------------- Multi-Disciplinary Care Plan Details Patient Name: [place], [name] Date of Service: 07/31/2016 1:45 PM Medical Record Number: [id number] Patient Account Number: [id number] Date of Birth/Sex: April 13, 1930 (83 y.o. Female) Treating RN: Huel Coventry Primary Care Emmons Toth: Etheleen Nicks Other Clinician: Referring Babbie Dondlinger: Etheleen Nicks Treating Kayanna Mckillop/Extender: Rudene Re in Treatment: 6 Active Inactive Electronic Signature(s) Signed: 08/25/2016 12:36:27 PM By: Elliot Gurney RN, BSN, Kim RN, BSN Previous Signature: 07/31/2016 3:30:03 PM Version By: Elliot Gurney RN, BSN, Kim RN, BSN Entered By: Elliot Gurney, RN, BSN, Kim on 08/14/2016 10:16:23 Kurowski, [name] ([id number]) -------------------------------------------------------------------------------- Pain Assessment Details Patient Name: LAI, HENDRI[place]. Date of Service:  07/31/2016 1:45 PM Medical Record Number: [id number] Patient Account Number: [id number] Date of Birth/Sex: 1929/12/05 (83 y.o. Female) Treating RN: Huel Coventry Primary Care Demitrious Mccannon: Etheleen Nicks Other  Clinician: Referring Doyce Saling: Etheleen Nicks Treating Gianfranco Araki/Extender: Rudene Re in Treatment: 6 Active Problems Location of Pain Severity and Description of Pain Patient Has Paino Yes Site Locations Pain Location: Pain in Ulcers With Dressing Change: Yes Rate the pain. Current Pain Level: 5 Character of Pain Describe the Pain: Aching, Tender, Throbbing Pain Management and Medication Current Pain Management: Goals for Pain Management Topical or injectable lidocaine is offered to patient for acute pain when surgical debridement is performed. If needed, Patient is instructed to use over the counter pain medication for the following 24-48 hours after debridement. Wound care MDs do not prescribed pain medications. Patient has chronic pain or uncontrolled pain. Patient has been instructed to make an appointment with their Primary Care Physician for pain management. Electronic Signature(s) Signed: 07/31/2016 3:30:03 PM By: Elliot Gurney, RN, BSN, Kim RN, BSN Entered By: Elliot Gurney, RN, BSN, Kim on 07/31/2016 13:47:50 Curling, [name] ([id number]) -------------------------------------------------------------------------------- Patient/Caregiver Education Details Patient Name: [place], [name] Date of Service: 07/31/2016 1:45 PM Medical Record Number: [id number] Patient Account Number: [id number] Date of Birth/Gender: [birth year] (83 y.o. Female) Treating RN: Huel Coventry Primary Care Physician: Etheleen Nicks Other Clinician: Referring Physician: Etheleen Nicks Treating Physician/Extender: Rudene Re in Treatment: 6 Education Assessment Education Provided To: Patient Education Topics Provided Venous: Controlling Swelling with Compression Stockings , Controlling Swelling with Multilayered Handouts: Compression Wraps, Managing Venous Disease and Related Ulcers Methods: Demonstration Responses: State content correctly Electronic Signature(s) Signed: 07/31/2016 3:30:03 PM By:  Elliot Gurney, RN, BSN, Kim RN, BSN Entered By: Elliot Gurney, RN, BSN, Kim on 07/31/2016 14:25:28 Galli, [name] ([id number]) -------------------------------------------------------------------------------- Wound Assessment Details Patient Name: [place], [name]. Date of Service: 07/31/2016 1:45 PM Medical Record Number: [id number] Patient Account Number: [id number] Date of Birth/Sex: Mar 17, 1930 (83 y.o. Female) Treating RN: Huel Coventry Primary Care Thomasene Dubow: Etheleen Nicks Other Clinician: Referring Ramata Strothman: Etheleen Nicks Treating Tighe Gitto/Extender: Rudene Re in Treatment: 6 Wound Status Wound Number: 1 Primary Trauma, Other Etiology: Wound Location: Left Lower Leg - Lateral, Proximal Secondary Lymphedema Etiology: Wounding Event: Gradually Appeared Wound Healed - Epithelialized Date Acquired: 05/14/2016 Status: Weeks Of Treatment: 6 Comorbid Lymphedema, Asthma, Deep Vein Clustered Wound: No History: Thrombosis, Received Chemotherapy, Received Radiation Photos Wound Measurements Length: (cm) 0 % Reduction i Width: (cm) 0 % Reduction i Depth: (cm) 0 Epithelializa Area: (cm) 0 Tunneling: Volume: (cm) 0 Undermining: n Area: 100% n Volume: 100% tion: Large (67-100%) No No Wound Description Full Thickness Without Exposed Classification: Support Structures Wound Margin: Flat and Intact Exudate Small Amount: Exudate Type: Serosanguineous Exudate Color: red, brown Wound Bed Granulation Amount: None Present (0%) Exposed Structure Necrotic Amount: None Present (0%) Fascia Exposed: No Fat Layer (Subcutaneous Tissue) Exposed: No Konigsberg, [name] J. ([id number]) Tendon Exposed: No Muscle Exposed: No Joint Exposed: No Bone Exposed: No Limited to Skin Breakdown Periwound Skin Texture Texture Color No Abnormalities Noted: No No Abnormalities Noted: No Callus: No Atrophie Blanche: No Crepitus: No Cyanosis: No Excoriation: No Ecchymosis: No Induration: No Erythema:  No Rash: No Hemosiderin Staining: No Scarring: No Mottled: No Pallor: No Moisture Rubor: No No Abnormalities Noted: No Dry / Scaly: No Temperature / Pain Maceration: No Temperature: No Abnormality Wound Preparation Ulcer Cleansing: Other: soap and water, Topical Anesthetic Applied: None Treatment Notes Wound #1 (Left, Proximal, Lateral Lower Leg) 1. Cleansed with: Cleanse wound with antibacterial soap and  water 3. Peri-wound Care: Moisturizing lotion 5. Secondary Dressing Applied ABD Pad 7. Secured with 3 Layer Compression System - Left Lower Extremity Electronic Signature(s) Signed: 08/25/2016 12:36:27 PM By: Elliot GurneyWoody, RN, BSN, Kim RN, BSN Previous Signature: 07/31/2016 3:30:03 PM Version By: Elliot GurneyWoody, RN, BSN, Kim RN, BSN Entered By: Elliot GurneyWoody, RN, BSN, Kim on 08/14/2016 10:14:06 Apsey, [name] ([id number]) -------------------------------------------------------------------------------- Wound Assessment Details Patient Name: [name] CoombsSMITH, [name] J. Date of Service: 07/31/2016 1:45 PM Medical Record Number: [id number] Patient Account Number: [id number] Date of Birth/Sex: 10/15/1929 (83 y.o. Female) Treating RN: Huel CoventryWoody, Kim Primary Care Anija Brickner: Etheleen NicksMACDONALD, KERI Other Clinician: Referring Collen Vincent: Etheleen NicksMACDONALD, KERI Treating Leanette Eutsler/Extender: Rudene ReBritto, Errol Weeks in Treatment: 6 Wound Status Wound Number: 2 Primary Lymphedema Etiology: Wound Location: Left, Distal, Lateral Lower Leg Secondary Venous Leg Ulcer Wounding Event: Gradually Appeared Etiology: Date Acquired: 05/14/2016 Wound Healed - Epithelialized Weeks Of Treatment: 6 Status: Clustered Wound: No Comorbid Lymphedema, Asthma, Deep Vein History: Thrombosis, Received Chemotherapy, Received Radiation Photos Photo Uploaded By: Elliot GurneyWoody, RN, BSN, Kim on 07/31/2016 15:25:50 Wound Measurements Length: (cm) 0 % Reduction Width: (cm) 0 % Reduction Depth: (cm) 0 Epitheliali Area: (cm) 0 Tunneling: Volume: (cm) 0 Underminin in  Area: 99.6% in Volume: 99.8% zation: Large (67-100%) No g: No Wound Description Full Thickness Without Exposed Classification: Support Structures Wound Margin: Flat and Intact Exudate None Present Amount: Wound Bed Granulation Amount: None Present (0%) Exposed Structure Necrotic Amount: None Present (0%) Fascia Exposed: No Fat Layer (Subcutaneous Tissue) Exposed: No Tendon Exposed: No Schommer, Denis J. ([id number]) Muscle Exposed: No Joint Exposed: No Bone Exposed: No Limited to Skin Breakdown Periwound Skin Texture Texture Color No Abnormalities Noted: No No Abnormalities Noted: No Callus: No Atrophie Blanche: No Crepitus: No Cyanosis: No Excoriation: No Ecchymosis: No Induration: No Erythema: No Rash: No Hemosiderin Staining: No Scarring: No Mottled: No Pallor: No Moisture Rubor: No No Abnormalities Noted: No Dry / Scaly: Yes Temperature / Pain Maceration: No Temperature: No Abnormality Wound Preparation Ulcer Cleansing: Other: soap and water, Topical Anesthetic Applied: None, Other: lidocaine 4%, Treatment Notes Wound #2 (Left, Distal, Lateral Lower Leg) 1. Cleansed with: Cleanse wound with antibacterial soap and water 3. Peri-wound Care: Moisturizing lotion 5. Secondary Dressing Applied ABD Pad 7. Secured with 3 Layer Compression System - Left Lower Extremity Electronic Signature(s) Signed: 08/25/2016 12:36:27 PM By: Elliot GurneyWoody, RN, BSN, Kim RN, BSN Previous Signature: 07/31/2016 3:30:03 PM Version By: Elliot GurneyWoody, RN, BSN, Kim RN, BSN Entered By: Elliot GurneyWoody, RN, BSN, Kim on 08/14/2016 10:13:57 Schamberger, [name] ([id number]) -------------------------------------------------------------------------------- Vitals Details Patient Name: [name] CoombsSMITH, Jill J. Date of Service: 07/31/2016 1:45 PM Medical Record Number: [id number] Patient Account Number: [id number] Date of Birth/Sex: 10/15/1929 (83 y.o. Female) Treating RN: Huel CoventryWoody, Kim Primary Care Christofer Shen: Etheleen NicksMACDONALD, KERI Other  Clinician: Referring Makyia Erxleben: Etheleen NicksMACDONALD, KERI Treating Saskia Simerson/Extender: Rudene ReBritto, Errol Weeks in Treatment: 6 Vital Signs Time Taken: 13:47 Temperature (F): 97.5 Height (in): 66 Pulse (bpm): 69 Weight (lbs): 221 Respiratory Rate (breaths/min): 18 Body Mass Index (BMI): 35.7 Blood Pressure (mmHg): 171/56 Reference Range: 80 - 120 mg / dl Electronic Signature(s) Signed: 07/31/2016 3:30:03 PM By: Elliot GurneyWoody, RN, BSN, Kim RN, BSN Entered By: Elliot GurneyWoody, RN, BSN, Kim on 07/31/2016 13:48:08

## 2016-08-01 NOTE — ED Triage Notes (Addendum)
[facility], Patient fell forward while picking up something, landing on her left hip. Patient c/o left hip pain. Ems denies shortening and rotation. Denies LOC

## 2016-08-01 NOTE — ED Provider Notes (Signed)
[facility] Emergency Department Provider Note  ____________________________________________  Time seen: Approximately 12:40 PM  I have reviewed the triage vital signs and the nursing notes.   HISTORY  Chief Complaint Hip Pain   HPI [name] is a 83 y.o. female with a history of diabetes, hypertension, stroke, anemia, colon cancer, DVT on coumadin who presents for evaluation of left hip pain and back pain status post mechanical fall. Patient reports that she was sitting on a chair cleaning her cabinets when she leaned forward too much and fell off the chair on top of her left hip. She denies head trauma or LOC. She is complaining of 5 out of 10 sharp and constant pain located in her left hip and her lumbar spine. She was unable to stand up. The pain is worse with movement. She denies dizziness, palpitations, chest pain, shortness of breath, abdominal pain, back pain, headache  History reviewed. No pertinent past medical history.  There are no active problems to display for this patient.   No past surgical history on file.  Prior to Admission medications   Medication Sig Start Date End Date Taking? Authorizing Provider  acetaminophen (TYLENOL) 500 MG tablet Take 1,000 mg by mouth 2 (two) times daily.   Yes Historical Provider, MD  OVER THE COUNTER MEDICATION Take 1 tablet by mouth 2 (two) times daily. otc eye multivitamin   Yes Historical Provider, MD  PRESCRIPTION MEDICATION Take 1 Dose by mouth daily. "pill for her nerves" (see notes)   Yes Historical Provider, MD  warfarin (COUMADIN) 4 MG tablet Take 4 mg by mouth daily.   Yes Historical Provider, MD  warfarin (COUMADIN) 5 MG tablet Take 2.5 mg by mouth every evening.   Yes Historical Provider, MD  acetaminophen (TYLENOL) 500 MG tablet Take 2 tablets (1,000 mg total) by mouth every 8 (eight) hours. 08/01/16 08/05/16  Nita Sickle, MD  oxyCODONE (ROXICODONE) 5 MG immediate release tablet Take 1  tablet (5 mg total) by mouth every 8 (eight) hours as needed. 08/01/16 08/01/17  Nita Sickle, MD  senna (SENOKOT) 8.6 MG TABS tablet Take 1 tablet (8.6 mg total) by mouth at bedtime. 08/01/16   Nita Sickle, MD    Allergies Aspirin  History reviewed. No pertinent family history.  Social History Social History  Substance Use Topics  . Smoking status: Not on file  . Smokeless tobacco: Not on file  . Alcohol use Not on file    Review of Systems Constitutional: Negative for fever. Eyes: Negative for visual changes. ENT: Negative for facial injury or neck injury Cardiovascular: Negative for chest injury. Respiratory: Negative for shortness of breath. Negative for chest wall injury. Gastrointestinal: Negative for abdominal pain or injury. Genitourinary: Negative for dysuria. Musculoskeletal: + low back pain and left hip pain Skin: Negative for laceration/abrasions. Neurological: Negative for head injury.   ____________________________________________   PHYSICAL EXAM:  VITAL SIGNS: ED Triage Vitals  Enc Vitals Group     BP 08/01/16 1221 (!) 157/64     Pulse Rate 08/01/16 1221 70     Resp 08/01/16 1221 18     Temp 08/01/16 1221 98.6 F (37 C)     Temp Source 08/01/16 1221 Oral     SpO2 08/01/16 1221 98 %     Weight 08/01/16 1220 250 lb (113.4 kg)     Height 08/01/16 1220 5\' 6"  (1.676 m)     Head Circumference --      Peak Flow --  Pain Score --      Pain Loc --      Pain Edu? --      Excl. in GC? --    Full spinal precautions maintained throughout the trauma exam. Constitutional: Alert and oriented. No acute distress. Does not appear intoxicated. HEENT Head: Normocephalic and atraumatic. Face: No facial bony tenderness. Stable midface Ears: No hemotympanum bilaterally. No Battle sign Eyes: No eye injury. PERRL. No raccoon eyes Nose: Nontender. No epistaxis. No rhinorrhea Mouth/Throat: Mucous membranes are moist. No oropharyngeal blood. No dental  injury. Airway patent without stridor. Normal voice. Neck: no C-collar in place. No midline c-spine tenderness.  Cardiovascular: Normal rate, regular rhythm. Normal and symmetric distal pulses are present in all extremities. Pulmonary/Chest: Chest wall is stable and nontender to palpation/compression. Normal respiratory effort. Breath sounds are normal. No crepitus.  Abdominal: Soft, nontender, non distended. Musculoskeletal: ttp with movement of the left knee with no pain on palpation, ttp over the lateral proximal femur with limited ROM of the left hip due to pain. ttp over the paraspinal region of the lumbar spine, no midline ttp and no stepoffs. Nontender with normal full range of motion in all other extremities. No deformities. No thoracic or lumbar midline spinal tenderness. Pelvis is stable. Skin: Skin is warm, dry and intact. No abrasions or contutions. Psychiatric: Speech and behavior are appropriate. Neurological: Normal speech and language. Moves all extremities to command. No gross focal neurologic deficits are appreciated.  Glascow Coma Score: 4 - Opens eyes on own 6 - Follows simple motor commands 5 - Alert and oriented GCS: 15   ____________________________________________   LABS (all labs ordered are listed, but only abnormal results are displayed)  Labs Reviewed - No data to display ____________________________________________  EKG  none ____________________________________________  RADIOLOGY  XR L hip:  No fracture or significant hip joint abnormality  XR lumbar spine: 1. Diffuse osteopenia hand degenerative change. Multiple thoracic and upper lumbar compression fractures. Age undetermined.  2. Aortoiliac atherosclerotic vascular disease. Aortic ectasia 3.1 cm. Aortic ultrasound should be considered for further evaluation to exclude aneurysm .   XR left knee: 1. No acute fracture or dislocation. 2. Status post ORIF of a comminuted proximal tibial  fracture. Orthopedic hardware is well-seated with no evidence of loosening.  CT head: Negative  CT lumbar spine: 1. Minimal loss of height of T12 vertebral body with chronic appearance. No significant loss of height of the lumbar vertebral bodies. Suspected upper lumbar compression deformities on prior radiographs is probably due projectional artifact from patient positioning. 2. Moderate lumbar spondylosis with discogenic and facet degenerative changes as well as lower lumbar Baastrup's disease. ____________________________________________   PROCEDURES  Procedure(s) performed: None Procedures Critical Care performed:  None ____________________________________________   INITIAL IMPRESSION / ASSESSMENT AND PLAN / ED COURSE  83 y.o. female with a history of diabetes, hypertension, stroke, anemia, colon cancer, DVT on coumadin who presents for evaluation of left hip pain and back pain status post mechanical fall. Tenderness to palpation in the paraspinal lumbar region, tenderness to palpation of the L proximal lateral femur and tenderness with movement of the left knee and left hip. Patient is neurologically intact with no signs or symptoms of basilar skull fracture however since she is on Coumadin will pursue head CT. We'll get x-rays of the lumbar spine, left knee and hip. Will give fentanyl for the pain.   Clinical Course as of Aug 01 1629  Fri Aug 01, 2016  1542 Patient unable to ambulate  with pain medication due to severe pain in her lumbar spine. Will get CT and admit for pain control  [CV]  1631 CT with no acute findings. Patient will be admitted to the hospitalist service for pain control.  [CV]    Clinical Course User Index [CV] Nita Sicklearolina Napoleon Monacelli, MD    Pertinent labs & imaging results that were available during my care of the patient were reviewed by me and considered in my medical decision making (see chart for  details).    ____________________________________________   FINAL CLINICAL IMPRESSION(S) / ED DIAGNOSES  Final diagnoses:  Fall, initial encounter  Acute midline low back pain without sciatica  Acute pain of left hip      NEW MEDICATIONS STARTED DURING THIS VISIT:  New Prescriptions   ACETAMINOPHEN (TYLENOL) 500 MG TABLET    Take 2 tablets (1,000 mg total) by mouth every 8 (eight) hours.   OXYCODONE (ROXICODONE) 5 MG IMMEDIATE RELEASE TABLET    Take 1 tablet (5 mg total) by mouth every 8 (eight) hours as needed.   SENNA (SENOKOT) 8.6 MG TABS TABLET    Take 1 tablet (8.6 mg total) by mouth at bedtime.     Note:  This document was prepared using Dragon voice recognition software and may include unintentional dictation errors.    Nita Sicklearolina Jeriyah Granlund, MD 08/01/16 [phone]

## 2016-08-01 NOTE — Progress Notes (Signed)
Family Meeting Note  Advance Directive:yes  Today a meeting took place with the ER room  Discussed CODE STATUS with patient in the room. She requests DO NOT RESUSCITATE. No other family members present. Patient is being admitted for intractable pain following a mechanical fall. She feels low better at present. Overall goals of care discussed. Questions answered.  Time spent during discussion: 16 minutes  Divine Imber, MD

## 2016-08-02 DIAGNOSIS — S39012A Strain of muscle, fascia and tendon of lower back, initial encounter: Secondary | ICD-10-CM | POA: Diagnosis not present

## 2016-08-02 LAB — CBC
HCT: 36.4 % (ref 35.0–47.0)
Hemoglobin: 12.5 g/dL (ref 12.0–16.0)
MCH: 30.9 pg (ref 26.0–34.0)
MCHC: 34.5 g/dL (ref 32.0–36.0)
MCV: 89.5 fL (ref 80.0–100.0)
PLATELETS: 182 10*3/uL (ref 150–440)
RBC: 4.06 MIL/uL (ref 3.80–5.20)
RDW: 13.7 % (ref 11.5–14.5)
WBC: 6.9 10*3/uL (ref 3.6–11.0)

## 2016-08-02 LAB — PROTIME-INR
INR: 2.51
PROTHROMBIN TIME: 27.6 s — AB (ref 11.4–15.2)

## 2016-08-02 NOTE — Care Management Obs Status (Signed)
MEDICARE OBSERVATION STATUS NOTIFICATION   Patient Details  Name: [name] MRN: [id number] Date of Birth: 27-Aug-1929   Medicare Observation Status Notification Given:  Yes    Hayli Milligan A, RN 08/02/2016, 3:04 PM

## 2016-08-02 NOTE — Clinical Social Work Note (Signed)
CSW received verbal consult from PT concerning the patient's needs. The patient is under observation with traditional Medicare which will not pay for SNF for STR under observation. CSW will speak to the family and alert them when able.  Argentina PonderKaren Martha Alverda Nazzaro, MSW, Theresia MajorsLCSWA [phone]

## 2016-08-02 NOTE — Consult Note (Signed)
ANTICOAGULATION CONSULT NOTE -Follow up Pharmacy Consult for warfarin Indication: hx of DVT  Allergies  Allergen Reactions  . Aspirin Nausea Only    Abdominal pain    Patient Measurements: Height: 5\' 6"  (167.6 cm) Weight: 213 lb 4.8 oz (96.8 kg) IBW/kg (Calculated) : 59.3 Heparin Dosing Weight:   Vital Signs: Temp: 98.5 F (36.9 C) (01/27 0725) Temp Source: Oral (01/27 0725) BP: 152/62 (01/27 0725) Pulse Rate: 80 (01/27 0725)  Labs:  Recent Labs  08/01/16 1726 08/02/16 0338  HGB 12.0 12.5  HCT 36.1 36.4  PLT 188 182  LABPROT 28.4* 27.6*  INR 2.61 2.51    CrCl cannot be calculated (Patient's most recent lab result is older than the maximum 21 days allowed.).   Medical History: History reviewed. No pertinent past medical history.  Medications:  Scheduled:  . acetaminophen  1,000 mg Oral BID  . docusate sodium  100 mg Oral BID  . lidocaine  1 patch Transdermal Q24H  . methocarbamol  500 mg Oral TID  . warfarin  2.5 mg Oral Once per day on Sun Tue Thu Sat  . warfarin  4 mg Oral Q M,W,F-1800  . Warfarin - Pharmacist Dosing Inpatient   Does not apply q1800    Assessment: Pt is a 83 year old female who presents s/p fall. Scans neg for any fracture and no bleeding reported. CBC WNL. Pt takes warfarin at home for hx of DVT. Pt home dose is 4mg  MWF and 2.5mg  all other days.   1/26  INR 2.61 Coumadin 4mg  1/27  INR 2.51.   Goal of Therapy:  INR 2-3 Monitor platelets by anticoagulation protocol: Yes   Plan:  Resume pt home dose and recheck INR in the AM.  [name],[name], [name] Clinical Pharmacist 08/02/2016,10:32 AM

## 2016-08-02 NOTE — Evaluation (Signed)
Physical Therapy Evaluation Patient Details Name: [name] MRN: [id number] DOB: 21-Aug-1929 Today's Date: 08/02/2016   History of Present Illness  83 yo female with onset of LBP after a sliding fall onto R hip, resulting in inability to walk.  Per pt was already incontinent of urine and using paper undergarments, PMHx:  old L tib fx with ORIF, spinal stenosis, multi level lumbar degenerative arthrosis L3-S1, disc height loss worst L4-5, Baastrup's disease,   Clinical Impression  Pt is up to step with PT at side of bed, has limited standing tolerance and difficulty with pain mainly when sitting and transition out to side of bed and back.  Her plan is to look into SNF care, and is likely to be turned down over insurance.  Pt will need some family support or caregivers hired in to assist her as she is the caregiver for her husband.  Follow acutely to increase lower body strength and to work on control of mobility with her movement precautions.  Fall back to HHPT if needed for strengthening and gait training.    Follow Up Recommendations SNF    Equipment Recommendations  3in1 (PT) (if going directly home)    Recommendations for Other Services       Precautions / Restrictions Precautions Precautions: Fall;Back Restrictions Weight Bearing Restrictions: No      Mobility  Bed Mobility Overal bed mobility: Needs Assistance Bed Mobility: Supine to Sit;Sit to Supine     Supine to sit: Mod assist;Max assist Sit to supine: Max assist   General bed mobility comments: using bedrails and cued sequence to meet precautions  Transfers Overall transfer level: Needs assistance Equipment used: Rolling walker (2 wheeled);1 person hand held assist Transfers: Sit to/from Stand Sit to Stand: Mod assist;+2 safety/equipment;+2 physical assistance         General transfer comment: CNA stood by to do extended standign for cleaning up from incontinence  Ambulation/Gait Ambulation/Gait  assistance: Min assist;Mod assist Ambulation Distance (Feet): 3 Feet Assistive device: Rolling walker (2 wheeled);1 person hand held assist Gait Pattern/deviations: Step-to pattern;Narrow base of support;Wide base of support;Drifts right/left (sidesteps) Gait velocity: reduced Gait velocity interpretation: Below normal speed for age/gender General Gait Details: sidesteps for bedside and leaning on walker, dependent  Stairs            Wheelchair Mobility    Modified Rankin (Stroke Patients Only)       Balance Overall balance assessment: History of Falls                                           Pertinent Vitals/Pain Pain Assessment: Faces Faces Pain Scale: Hurts whole lot Pain Location: back with transition to sit and with sitting Pain Descriptors / Indicators: Aching Pain Intervention(s): Limited activity within patient's tolerance;Monitored during session;Premedicated before session;Repositioned    Home Living Family/patient expects to be discharged to:: Private residence Living Arrangements: Spouse/significant other (Pt is caregiver, has several children who may help) Available Help at Discharge: Family;Available PRN/intermittently (unless otherwise arranged) Type of Home: House Home Access: Stairs to enter Entrance Stairs-Rails: Right;Left;Can reach both Entrance Stairs-Number of Steps: 4 Home Layout: One level Home Equipment: Walker - 2 wheels;Cane - single point (NO AD NEEDED before)      Prior Function Level of Independence: Independent               Hand Dominance  Dominant Hand: Right    Extremity/Trunk Assessment   Upper Extremity Assessment Upper Extremity Assessment: Overall WFL for tasks assessed    Lower Extremity Assessment Lower Extremity Assessment: Overall WFL for tasks assessed    Cervical / Trunk Assessment Cervical / Trunk Assessment: Normal  Communication   Communication: No difficulties  Cognition  Arousal/Alertness: Awake/alert Behavior During Therapy: WFL for tasks assessed/performed Overall Cognitive Status: Within Functional Limits for tasks assessed                      General Comments      Exercises     Assessment/Plan    PT Assessment Patient needs continued PT services  PT Problem List Decreased strength;Decreased range of motion;Decreased activity tolerance;Decreased balance;Decreased mobility;Decreased coordination;Decreased knowledge of use of DME;Decreased safety awareness;Obesity;Decreased skin integrity;Pain          PT Treatment Interventions DME instruction;Gait training;Functional mobility training;Stair training;Therapeutic activities;Therapeutic exercise;Balance training;Neuromuscular re-education;Patient/family education    PT Goals (Current goals can be found in the Care Plan section)  Acute Rehab PT Goals Patient Stated Goal: to get better PT Goal Formulation: With patient Time For Goal Achievement: 08/16/16 Potential to Achieve Goals: Good    Frequency Min 2X/week   Barriers to discharge Decreased caregiver support (husband was cared for by pt)      Co-evaluation               End of Session Equipment Utilized During Treatment: Gait belt Activity Tolerance: Patient tolerated treatment well;No increased pain Patient left: in bed;with call bell/phone within reach;with bed alarm set;with nursing/sitter in room Nurse Communication: Mobility status    Functional Assessment Tool Used: clinical judgment Functional Limitation: Mobility: Walking and moving around Mobility: Walking and Moving Around Current Status (Z6109): At least 40 percent but less than 60 percent impaired, limited or restricted Mobility: Walking and Moving Around Goal Status [phone]): At least 1 percent but less than 20 percent impaired, limited or restricted    Time: 0821-0851 PT Time Calculation (min) (ACUTE ONLY): 30 min   Charges:   PT Evaluation $PT Eval  Moderate Complexity: 1 Procedure PT Treatments $Therapeutic Activity: 8-22 mins   PT G Codes:   PT G-Codes **NOT FOR INPATIENT CLASS** Functional Assessment Tool Used: clinical judgment Functional Limitation: Mobility: Walking and moving around Mobility: Walking and Moving Around Current Status (U9811): At least 40 percent but less than 60 percent impaired, limited or restricted Mobility: Walking and Moving Around Goal Status [phone]): At least 1 percent but less than 20 percent impaired, limited or restricted    Ivar Drape 08/02/2016, 10:34 AM   Samul Dada, PT MS Acute Rehab Dept. Number: [facility] [id number] and [facility] [phone]

## 2016-08-02 NOTE — Progress Notes (Signed)
[employer] - [place] at [facility]   PATIENT NAME: [name]    MR#:  [id number]  DATE OF BIRTH:  July 18, 1929  SUBJECTIVE:  CHIEF COMPLAINT:   Chief Complaint  Patient presents with  . Hip Pain   Back pain. REVIEW OF SYSTEMS:  Review of Systems  Constitutional: Positive for malaise/fatigue. Negative for chills and fever.  HENT: Negative for congestion.   Eyes: Negative for blurred vision and double vision.  Respiratory: Negative for cough, shortness of breath and stridor.   Cardiovascular: Negative for chest pain and leg swelling.  Gastrointestinal: Negative for abdominal pain, melena, nausea and vomiting.  Genitourinary: Negative for dysuria and frequency.  Musculoskeletal: Positive for back pain and joint pain.  Neurological: Positive for weakness. Negative for dizziness, focal weakness and loss of consciousness.  Psychiatric/Behavioral: Negative for depression. The patient is not nervous/anxious.     DRUG ALLERGIES:   Allergies  Allergen Reactions  . Aspirin Nausea Only    Abdominal pain   VITALS:  Blood pressure (!) 143/48, pulse 78, temperature 98.6 F (37 C), temperature source Oral, resp. rate 20, height 5\' 6"  (1.676 m), weight 213 lb 4.8 oz (96.8 kg), SpO2 (!) 89 %. PHYSICAL EXAMINATION:  Physical Exam  Constitutional: She is oriented to person, place, and time and well-developed, well-nourished, and in no distress.  HENT:  Head: Normocephalic.  Mouth/Throat: Oropharynx is clear and moist.  Eyes: Conjunctivae and EOM are normal.  Neck: Normal range of motion. Neck supple. No JVD present. No tracheal deviation present.  Cardiovascular: Normal rate, regular rhythm and normal heart sounds.  Exam reveals no gallop.   No murmur heard. Pulmonary/Chest: Effort normal and breath sounds normal. No respiratory distress. She has no wheezes. She has no rales.  Abdominal: Soft. Bowel sounds are normal. She exhibits no distension. There is no  tenderness.  Musculoskeletal: She exhibits tenderness. She exhibits no edema.  Neurological: She is alert and oriented to person, place, and time. No cranial nerve deficit.  Skin: Skin is dry. No rash noted. No erythema.  Psychiatric: Affect normal.   LABORATORY PANEL:   CBC  Recent Labs Lab 08/02/16 0338  WBC 6.9  HGB 12.5  HCT 36.4  PLT 182   ------------------------------------------------------------------------------------------------------------------ Chemistries  No results for input(s): NA, K, CL, CO2, GLUCOSE, BUN, CREATININE, CALCIUM, MG, AST, ALT, ALKPHOS, BILITOT in the last 168 hours.  Invalid input(s): GFRCGP RADIOLOGY:  No results found. ASSESSMENT AND PLAN:   [name]  is a 83 y.o. female with a known history of Recurrent DVT now on oral anticoagulation, degenerative spine comes to the emergency room after she tried to get up from her chair to pick up a can which is lying on the floor and slid from her chair and landed up on her right hip. She felt a jolt-like pain going to her back.  1. Intractable back pain following a mechanical fall -Appears lumbosacral strain following fall. CT lumbar spine does not show any fracture. Patient has had old thoracic compression fractures. -Hip x-rays shows DJD and osteopenia no fracture  pain control -IV fentanyl, lidocaine patch, oral oxycodone. -Robaxin for muscle relaxant -K pad -Physical therapy: SNF. Social worker consult for placement.  2. History of DVT, recurrent -Resumed Coumadin, INR is therapeutic. -Pharmacy consulted for Coumadin management  All the records are reviewed and case discussed with Care Management/Social Worker. Management plans discussed with the patient, Her husband and 3 sons and they are in agreement.  CODE STATUS: DO  NOT RESUSCITATE  TOTAL TIME TAKING CARE OF THIS PATIENT: 36 minutes.   More than 50% of the time was spent in counseling/coordination of care: YES  POSSIBLE D/C IN 1-2  DAYS, DEPENDING ON CLINICAL CONDITION.   Shaune Pollackhen, Roda Lauture M.D on 08/02/2016 at 4:12 PM  Between 7am to 6pm - Pager - [phone]  After 6pm go to [email or web address] - Social research officer, [email or web address] [email or web address]  [employer] [facility] [place] [facility]  Office  [phone]  CC: Primary care physician; Etheleen NicksKeri MacDonald, NP  Note: This dictation was prepared with Dragon dictation along with smaller phrase technology. Any transcriptional errors that result from this process are unintentional.

## 2016-08-03 DIAGNOSIS — S39012A Strain of muscle, fascia and tendon of lower back, initial encounter: Secondary | ICD-10-CM | POA: Diagnosis not present

## 2016-08-03 LAB — PROTIME-INR
INR: 2.58
Prothrombin Time: 28.2 seconds — ABNORMAL HIGH (ref 11.4–15.2)

## 2016-08-03 MED ORDER — LIDOCAINE 5 % EX PTCH: 1.0000 | MEDICATED_PATCH | CUTANEOUS | 0 refills | Status: DC

## 2016-08-03 MED ORDER — OXYCODONE HCL 5 MG PO TABS
5.0000 mg | ORAL_TABLET | Freq: Three times a day (TID) | ORAL | 0 refills | Status: DC | PRN
Start: 2016-08-03 — End: 2016-08-11

## 2016-08-03 NOTE — Consult Note (Signed)
ANTICOAGULATION CONSULT NOTE -Follow up Pharmacy Consult for warfarin Indication: hx of DVT  Allergies  Allergen Reactions  . Aspirin Nausea Only    Abdominal pain    Patient Measurements: Height: 5\' 6"  (167.6 cm) Weight: 213 lb 4.8 oz (96.8 kg) IBW/kg (Calculated) : 59.3 Heparin Dosing Weight:   Vital Signs: Temp: 98 F (36.7 C) (01/28 0728) Temp Source: Oral (01/28 0728) BP: 177/54 (01/28 0728) Pulse Rate: 64 (01/28 0728)  Labs:  Recent Labs  08/01/16 1726 08/02/16 0338 08/03/16 0355  HGB 12.0 12.5  --   HCT 36.1 36.4  --   PLT 188 182  --   LABPROT 28.4* 27.6* 28.2*  INR 2.61 2.51 2.58    CrCl cannot be calculated (Patient's most recent lab result is older than the maximum 21 days allowed.).   Medical History: History reviewed. No pertinent past medical history.  Medications:  Scheduled:  . acetaminophen  1,000 mg Oral BID  . docusate sodium  100 mg Oral BID  . lidocaine  1 patch Transdermal Q24H  . methocarbamol  500 mg Oral TID  . warfarin  2.5 mg Oral Once per day on Sun Tue Thu Sat  . warfarin  4 mg Oral Q M,W,F-1800  . Warfarin - Pharmacist Dosing Inpatient   Does not apply q1800    Assessment: Pt is a 83 year old female who presents s/p fall. Scans neg for any fracture and no bleeding reported. CBC WNL. Pt takes warfarin at home for hx of DVT. Pt home dose is 4mg  MWF and 2.5mg  all other days.   1/26  INR 2.61 Coumadin 4mg  1/27  INR 2.51 Coumadin 2.5mg  1/28  INR 2.58  Goal of Therapy:  INR 2-3 Monitor platelets by anticoagulation protocol: Yes   Plan:  Continue pt home dose and recheck INR in the AM.  [name],Yamilette Garretson K, [facility]ayette Medical CenterRPH Clinical Pharmacist 08/03/2016,11:06 AM

## 2016-08-03 NOTE — Progress Notes (Signed)
Reviewed discharge summary, with verbal understanding. Narcotic Rx given upon discharge along with personal belongings. Escorted to personal vehicle via wc.

## 2016-08-03 NOTE — Care Management Note (Signed)
Case Management Note  Patient Details  Name: [name] MRN: [id number] Date of Birth: 1929/07/16  Subjective/Objective:      Discussed discharge planning with Ms [name]. She chose [facility] to be her home health provider. Gordon referral for HH-PT and RN was called to [name] at [facility]. No other discharge needs identified.               Action/Plan:   Expected Discharge Date:  08/03/16               Expected Discharge Plan:     In-House Referral:     Discharge planning Services     Post Acute Care Choice:    Choice offered to:     DME Arranged:    DME Agency:     HH Arranged:    HH Agency:     Status of Service:     If discussed at [employer] Meetings, dates discussed:    Additional Comments:  [name] Sayre A, RN 08/03/2016, 11:23 AM

## 2016-08-03 NOTE — Discharge Summary (Signed)
[facility] [place] at [facility]   PATIENT NAME: [name]    MR#:  [id number]  DATE OF BIRTH:  [birth year]  DATE OF ADMISSION:  08/01/2016   ADMITTING PHYSICIAN: Enedina FinnerSona Patel, MD  DATE OF DISCHARGE: 08/03/2016  1:28 PM  PRIMARY CARE PHYSICIAN: Etheleen NicksKeri MacDonald, NP   ADMISSION DIAGNOSIS:  Fall, initial encounter [W19.XXXA] Acute pain of left hip [M25.552] Acute midline low back pain without sciatica [M54.5] DISCHARGE DIAGNOSIS:  Active Problems:   Intractable back pain  SECONDARY DIAGNOSIS:  History reviewed. No pertinent past medical history. HOSPITAL COURSE:  [name] Bayleyrma Smithis a 83 y.o. 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 She felt a jolt-like pain going to her back.  1. Intractable back pain following a mechanical fall -Appears lumbosacral strain following fall. CT lumbar spine does not show any fracture. Patient has had old thoracic compression fractures. -Hip x-rays shows DJD and osteopenia no fracture  pain control -IV fentanyl, lidocaine patch, oral oxycodone. -Robaxin for muscle relaxant -K pad -Physical therapy: SNF.  Pain is better controlled. Per Child psychotherapistsocial worker, the patient is on observation, cannot be placed for skilled nursing facility. The patient will get home health and PT.  2. History of DVT, recurrent -Resumed Coumadin, INR is therapeutic. -Pharmacy consulted for Coumadin management  DISCHARGE CONDITIONS:  Stable, discharged to home with home health and PT. CONSULTS OBTAINED:   DRUG ALLERGIES:   Allergies  Allergen Reactions  . Aspirin Nausea Only    Abdominal pain   DISCHARGE MEDICATIONS:   Allergies as of 08/03/2016      Reactions   Aspirin Nausea Only   Abdominal pain      Medication List    TAKE  these medications   acetaminophen 500 MG tablet Commonly known as:  TYLENOL Take 1,000 mg by mouth 2 (two) times daily. What changed:  Another medication with the same name was added. Make sure you understand how and when to take each.   acetaminophen 500 MG tablet Commonly known as:  TYLENOL Take 2 tablets (1,000 mg total) by mouth every 8 (eight) hours. What changed:  You were already taking a medication with the same name, and this prescription was added. Make sure you understand how and when to take each.   lidocaine 5 % Commonly known as:  LIDODERM Place 1 patch onto the skin daily. Remove & Discard patch within 12 hours or as directed by MD   OVER THE COUNTER MEDICATION Take 1 tablet by mouth 2 (two) times daily. otc eye multivitamin   oxyCODONE 5 MG immediate release tablet Commonly known as:  ROXICODONE Take 1 tablet (5 mg total) by mouth every 8 (eight) hours as needed.   PRESCRIPTION MEDICATION Take 1 Dose by mouth daily. "pill for her nerves" (see notes)   senna 8.6 MG Tabs tablet Commonly known as:  SENOKOT Take 1 tablet (8.6 mg total) by mouth at bedtime.   warfarin 5 MG tablet Commonly known as:  COUMADIN Take 2.5 mg by mouth. Take 2.5mg  every Sun, Tues, thurs and sat   warfarin 4 MG tablet Commonly known as:  COUMADIN Take 4 mg by mouth every Monday, Wednesday, and Friday.        DISCHARGE INSTRUCTIONS:  See AVS.  If you experience worsening of your admission symptoms, develop shortness of breath, life threatening emergency, suicidal or homicidal thoughts you must seek medical attention immediately by calling 911 or calling your MD immediately  if symptoms less severe.  You Must read complete instructions/literature along with all the possible adverse reactions/side effects for all the Medicines you take and that have been prescribed to you. Take any new Medicines after you have completely understood and accpet all the possible adverse reactions/side  effects.   Please note  You were cared for by a hospitalist during your hospital stay. If you have any questions about your discharge medications or the care you received while you were in the hospital after you are discharged, you can call the unit and asked to speak with the hospitalist on call if the hospitalist that took care of you is not available. Once you are discharged, your primary care physician will handle any further medical issues. Please note that NO REFILLS for any discharge medications will be authorized once you are discharged, as it is imperative that you return to your primary care physician (or establish a relationship with a primary care physician if you do not have one) for your aftercare needs so that they can reassess your need for medications and monitor your lab values.    On the day of Discharge:  VITAL SIGNS:  Blood pressure (!) 177/54, pulse 64, temperature 98 F (36.7 C), temperature source Oral, resp. rate 18, height 5\' 6"  (1.676 m), weight 213 lb 4.8 oz (96.8 kg), SpO2 93 %. PHYSICAL EXAMINATION:  GENERAL:  83 y.o.-year-old patient lying in the bed with no acute distress.  EYES: Pupils equal, round, reactive to light and accommodation. No scleral icterus. Extraocular muscles intact.  HEENT: Head atraumatic, normocephalic. Oropharynx and nasopharynx clear.  NECK:  Supple, no jugular venous distention. No thyroid enlargement, no tenderness.  LUNGS: Normal breath sounds bilaterally, no wheezing, rales,rhonchi or crepitation. No use of accessory muscles of respiration.  CARDIOVASCULAR: S1, S2 normal. No murmurs, rubs, or gallops.  ABDOMEN: Soft, non-tender, non-distended. Bowel sounds present. No organomegaly or mass.  EXTREMITIES: No pedal edema, cyanosis, or clubbing.  NEUROLOGIC: Cranial nerves II through XII are intact. Muscle strength 4/5 in all extremities. Sensation intact. Gait not checked.  PSYCHIATRIC: The patient is alert and oriented x 3.  SKIN: No  obvious rash, lesion, or ulcer.  DATA REVIEW:   CBC  Recent Labs Lab 08/02/16 0338  WBC 6.9  HGB 12.5  HCT 36.4  PLT 182    Chemistries  No results for input(s): NA, K, CL, CO2, GLUCOSE, BUN, CREATININE, CALCIUM, MG, AST, ALT, ALKPHOS, BILITOT in the last 168 hours.  Invalid input(s): GFRCGP   Microbiology Results  No results found for this or any previous visit.  RADIOLOGY:  No results found.   Management plans discussed with the patient, her sons and they are in agreement.  CODE STATUS:  Code Status History    Date Active Date Inactive Code Status Order ID Comments User Context   08/01/2016  8:27 PM 08/03/2016  4:28 PM DNR [id number]  Enedina Finner, MD ED    Questions for Most Recent Historical Code Status (Order [id number])    Question Answer Comment   In the event of cardiac or respiratory ARREST Do not call a "code blue"    In the event of cardiac or respiratory ARREST Do not perform Intubation, CPR, defibrillation or ACLS    In the event of cardiac or respiratory ARREST  Use medication by any route, position, wound care, and other measures to relive pain and suffering. May use oxygen, suction and manual treatment of airway obstruction as needed for comfort.         Advance Directive Documentation   Flowsheet Row Most Recent Value  Type of Advance Directive  Out of facility DNR (pink MOST or yellow form)  Pre-existing out of facility DNR order (yellow form or pink MOST form)  Physician notified to receive inpatient order  "MOST" Form in Place?  No data      TOTAL TIME TAKING CARE OF THIS PATIENT: 32 minutes.    Shaune Pollack M.D on 08/03/2016 at 5:28 PM  Between 7am to 6pm - Pager - [phone]  After 6pm go to [email or web address] - Social research officer, government  [facility] [place] [facility]  Office  [phone]  CC: Primary care physician; Etheleen Nicks, NP   Note: This dictation was prepared with Dragon dictation along with smaller phrase technology. Any  transcriptional errors that result from this process are unintentional.

## 2016-08-03 NOTE — Clinical Social Work Note (Signed)
Patient has declined SNF placement due to insurance. CSW signing off.  [name] PonderKaren Martha Siara Gordon, MSW [employer]CSWA [phone]

## 2016-08-03 NOTE — Progress Notes (Signed)
PT Cancellation Note  Patient Details Name: [name] MRN: [id number] DOB: [birth year]   Cancelled Treatment:    Reason Eval/Treat Not Completed: Other (comment)  D/C orders in chart.  Pt stated she felt confident with discharge and has all equipment needed at home.  No further questions or concerns.  Danielle DessSarah Demontre Padin 08/03/2016, 12:07 PM

## 2016-08-03 NOTE — Discharge Instructions (Signed)
Fall precaution. °HHPT °

## 2016-08-04 ENCOUNTER — Encounter (INDEPENDENT_AMBULATORY_CARE_PROVIDER_SITE_OTHER): Payer: Medicare Other

## 2016-08-04 ENCOUNTER — Other Ambulatory Visit: Payer: Self-pay | Admitting: Surgery

## 2016-08-04 ENCOUNTER — Ambulatory Visit: Payer: Medicare Other

## 2016-08-04 ENCOUNTER — Encounter (INDEPENDENT_AMBULATORY_CARE_PROVIDER_SITE_OTHER): Payer: Self-pay | Admitting: Vascular Surgery

## 2016-08-04 DIAGNOSIS — M7989 Other specified soft tissue disorders: Principal | ICD-10-CM

## 2016-08-04 DIAGNOSIS — M79669 Pain in unspecified lower leg: Secondary | ICD-10-CM

## 2016-08-04 DIAGNOSIS — L98499 Non-pressure chronic ulcer of skin of other sites with unspecified severity: Secondary | ICD-10-CM

## 2016-08-05 ENCOUNTER — Emergency Department: Payer: Medicare Other

## 2016-08-05 ENCOUNTER — Encounter: Payer: Self-pay | Admitting: Emergency Medicine

## 2016-08-05 ENCOUNTER — Inpatient Hospital Stay
Admission: EM | Admit: 2016-08-05 | Discharge: 2016-08-11 | DRG: 193 | Disposition: A | Discharge status: Skilled Nursing Facility | Payer: Medicare Other | Attending: Internal Medicine | Admitting: Internal Medicine

## 2016-08-05 DIAGNOSIS — Z87891 Personal history of nicotine dependence: Secondary | ICD-10-CM

## 2016-08-05 DIAGNOSIS — R262 Difficulty in walking, not elsewhere classified: Secondary | ICD-10-CM

## 2016-08-05 DIAGNOSIS — R0600 Dyspnea, unspecified: Secondary | ICD-10-CM

## 2016-08-05 DIAGNOSIS — J09X2 Influenza due to identified novel influenza A virus with other respiratory manifestations: Secondary | ICD-10-CM | POA: Diagnosis not present

## 2016-08-05 DIAGNOSIS — J440 Chronic obstructive pulmonary disease with acute lower respiratory infection: Secondary | ICD-10-CM | POA: Diagnosis present

## 2016-08-05 DIAGNOSIS — K5900 Constipation, unspecified: Secondary | ICD-10-CM | POA: Diagnosis present

## 2016-08-05 DIAGNOSIS — J101 Influenza due to other identified influenza virus with other respiratory manifestations: Secondary | ICD-10-CM

## 2016-08-05 DIAGNOSIS — M549 Dorsalgia, unspecified: Secondary | ICD-10-CM

## 2016-08-05 DIAGNOSIS — R0902 Hypoxemia: Secondary | ICD-10-CM

## 2016-08-05 DIAGNOSIS — J9601 Acute respiratory failure with hypoxia: Secondary | ICD-10-CM | POA: Diagnosis present

## 2016-08-05 DIAGNOSIS — Z7901 Long term (current) use of anticoagulants: Secondary | ICD-10-CM

## 2016-08-05 DIAGNOSIS — J111 Influenza due to unidentified influenza virus with other respiratory manifestations: Secondary | ICD-10-CM

## 2016-08-05 DIAGNOSIS — R0602 Shortness of breath: Secondary | ICD-10-CM

## 2016-08-05 DIAGNOSIS — R531 Weakness: Secondary | ICD-10-CM | POA: Diagnosis not present

## 2016-08-05 DIAGNOSIS — N183 Chronic kidney disease, stage 3 (moderate): Secondary | ICD-10-CM | POA: Diagnosis present

## 2016-08-05 DIAGNOSIS — F419 Anxiety disorder, unspecified: Secondary | ICD-10-CM | POA: Diagnosis present

## 2016-08-05 DIAGNOSIS — Z86718 Personal history of other venous thrombosis and embolism: Secondary | ICD-10-CM

## 2016-08-05 DIAGNOSIS — M6281 Muscle weakness (generalized): Secondary | ICD-10-CM

## 2016-08-05 DIAGNOSIS — M545 Low back pain: Secondary | ICD-10-CM | POA: Diagnosis present

## 2016-08-05 DIAGNOSIS — G8929 Other chronic pain: Secondary | ICD-10-CM | POA: Diagnosis present

## 2016-08-05 DIAGNOSIS — J441 Chronic obstructive pulmonary disease with (acute) exacerbation: Secondary | ICD-10-CM | POA: Diagnosis present

## 2016-08-05 DIAGNOSIS — J209 Acute bronchitis, unspecified: Secondary | ICD-10-CM | POA: Diagnosis present

## 2016-08-05 DIAGNOSIS — Z66 Do not resuscitate: Secondary | ICD-10-CM | POA: Diagnosis present

## 2016-08-05 HISTORY — DX: Chronic obstructive pulmonary disease, unspecified: J44.9

## 2016-08-05 HISTORY — DX: Acute embolism and thrombosis of unspecified deep veins of unspecified lower extremity: I82.409

## 2016-08-05 LAB — COMPREHENSIVE METABOLIC PANEL
ALT: 16 U/L (ref 14–54)
ANION GAP: 10 (ref 5–15)
AST: 44 U/L — AB (ref 15–41)
Albumin: 3.5 g/dL (ref 3.5–5.0)
Alkaline Phosphatase: 52 U/L (ref 38–126)
BILIRUBIN TOTAL: 0.9 mg/dL (ref 0.3–1.2)
BUN: 33 mg/dL — ABNORMAL HIGH (ref 6–20)
CHLORIDE: 104 mmol/L (ref 101–111)
CO2: 24 mmol/L (ref 22–32)
Calcium: 8.1 mg/dL — ABNORMAL LOW (ref 8.9–10.3)
Creatinine, Ser: 1.42 mg/dL — ABNORMAL HIGH (ref 0.44–1.00)
GFR, EST AFRICAN AMERICAN: 38 mL/min — AB (ref >60)
GFR, EST NON AFRICAN AMERICAN: 33 mL/min — AB (ref >60)
Glucose, Bld: 111 mg/dL — ABNORMAL HIGH (ref 65–99)
POTASSIUM: 4.1 mmol/L (ref 3.5–5.1)
Sodium: 138 mmol/L (ref 135–145)
TOTAL PROTEIN: 6.9 g/dL (ref 6.5–8.1)

## 2016-08-05 LAB — CBC WITH DIFFERENTIAL/PLATELET
Basophils Absolute: 0.1 10*3/uL (ref 0–0.1)
Basophils Relative: 1 %
EOS ABS: 0.1 10*3/uL (ref 0–0.7)
EOS PCT: 2 %
HCT: 34.4 % — ABNORMAL LOW (ref 35.0–47.0)
HEMOGLOBIN: 12 g/dL (ref 12.0–16.0)
LYMPHS ABS: 1.3 10*3/uL (ref 1.0–3.6)
LYMPHS PCT: 19 %
MCH: 31.5 pg (ref 26.0–34.0)
MCHC: 34.9 g/dL (ref 32.0–36.0)
MCV: 90.1 fL (ref 80.0–100.0)
MONO ABS: 1.1 10*3/uL — AB (ref 0.2–0.9)
MONOS PCT: 15 %
NEUTROS ABS: 4.5 10*3/uL (ref 1.4–6.5)
NEUTROS PCT: 63 %
PLATELETS: 177 10*3/uL (ref 150–440)
RBC: 3.82 MIL/uL (ref 3.80–5.20)
RDW: 13.6 % (ref 11.5–14.5)
WBC: 7 10*3/uL (ref 3.6–11.0)

## 2016-08-05 LAB — BLOOD GAS, VENOUS
ACID-BASE EXCESS: 2.3 mmol/L — AB (ref 0.0–2.0)
Bicarbonate: 27.8 mmol/L (ref 20.0–28.0)
O2 Saturation: 95.1 %
PH VEN: 7.39 (ref 7.250–7.430)
Patient temperature: 37
pCO2, Ven: 46 mmHg (ref 44.0–60.0)
pO2, Ven: 77 mmHg — ABNORMAL HIGH (ref 32.0–45.0)

## 2016-08-05 LAB — URINALYSIS, COMPLETE (UACMP) WITH MICROSCOPIC
Bilirubin Urine: NEGATIVE
GLUCOSE, UA: NEGATIVE mg/dL
KETONES UR: NEGATIVE mg/dL
LEUKOCYTES UA: NEGATIVE
Nitrite: NEGATIVE
PH: 5 (ref 5.0–8.0)
Protein, ur: 30 mg/dL — AB
RBC / HPF: NONE SEEN RBC/hpf (ref 0–5)
Specific Gravity, Urine: 1.02 (ref 1.005–1.030)

## 2016-08-05 LAB — PROTIME-INR
INR: 1.99
PROTHROMBIN TIME: 22.9 s — AB (ref 11.4–15.2)

## 2016-08-05 LAB — LACTIC ACID, PLASMA: Lactic Acid, Venous: 0.7 mmol/L (ref 0.5–1.9)

## 2016-08-05 LAB — TROPONIN I

## 2016-08-05 LAB — INFLUENZA PANEL BY PCR (TYPE A & B)
Influenza A By PCR: POSITIVE — AB
Influenza B By PCR: NEGATIVE

## 2016-08-05 MED ORDER — WARFARIN SODIUM 4 MG PO TABS
4.0000 mg | ORAL_TABLET | ORAL | Status: DC
  Administered 2016-08-06: 4 mg via ORAL
  Filled 2016-08-05: qty 1

## 2016-08-05 MED ORDER — LIDOCAINE 5 % EX PTCH
1.0000 | MEDICATED_PATCH | CUTANEOUS | Status: DC
  Administered 2016-08-06 – 2016-08-11 (×6): 1 via TRANSDERMAL
  Filled 2016-08-05 (×7): qty 1

## 2016-08-05 MED ORDER — ONDANSETRON HCL 4 MG/2ML IJ SOLN: 4.0000 mg | Freq: Four times a day (QID) | INTRAMUSCULAR | Status: DC | PRN

## 2016-08-05 MED ORDER — POLYETHYLENE GLYCOL 3350 17 G PO PACK
17.0000 g | PACK | Freq: Every day | ORAL | Status: DC
  Administered 2016-08-05 – 2016-08-10 (×4): 17 g via ORAL
  Filled 2016-08-05 (×6): qty 1

## 2016-08-05 MED ORDER — ACETAMINOPHEN 650 MG RE SUPP: 650.0000 mg | Freq: Four times a day (QID) | RECTAL | Status: DC | PRN

## 2016-08-05 MED ORDER — OSELTAMIVIR PHOSPHATE 75 MG PO CAPS
75.0000 mg | ORAL_CAPSULE | Freq: Once | ORAL | Status: AC
  Administered 2016-08-05: 75 mg via ORAL
  Filled 2016-08-05: qty 1

## 2016-08-05 MED ORDER — SENNA 8.6 MG PO TABS
1.0000 | ORAL_TABLET | Freq: Two times a day (BID) | ORAL | Status: DC
  Administered 2016-08-05 – 2016-08-10 (×7): 8.6 mg via ORAL
  Filled 2016-08-05 (×10): qty 1

## 2016-08-05 MED ORDER — OCUVITE-LUTEIN PO CAPS
1.0000 | ORAL_CAPSULE | Freq: Two times a day (BID) | ORAL | Status: DC
  Administered 2016-08-05 – 2016-08-11 (×12): 1 via ORAL
  Filled 2016-08-05 (×13): qty 1

## 2016-08-05 MED ORDER — GUAIFENESIN ER 600 MG PO TB12
600.0000 mg | ORAL_TABLET | Freq: Two times a day (BID) | ORAL | Status: DC
  Administered 2016-08-05 – 2016-08-11 (×12): 600 mg via ORAL
  Filled 2016-08-05 (×12): qty 1

## 2016-08-05 MED ORDER — TIOTROPIUM BROMIDE MONOHYDRATE 18 MCG IN CAPS
18.0000 ug | ORAL_CAPSULE | Freq: Every day | RESPIRATORY_TRACT | Status: DC
  Administered 2016-08-06 – 2016-08-07 (×2): 18 ug via RESPIRATORY_TRACT
  Filled 2016-08-05: qty 5

## 2016-08-05 MED ORDER — ALBUTEROL SULFATE (2.5 MG/3ML) 0.083% IN NEBU
2.5000 mg | INHALATION_SOLUTION | RESPIRATORY_TRACT | Status: DC
  Administered 2016-08-05 – 2016-08-06 (×4): 2.5 mg via RESPIRATORY_TRACT
  Filled 2016-08-05 (×4): qty 3

## 2016-08-05 MED ORDER — OXYCODONE HCL 5 MG PO TABS: 5.0000 mg | ORAL_TABLET | Freq: Four times a day (QID) | ORAL | Status: DC | PRN

## 2016-08-05 MED ORDER — DOCUSATE SODIUM 100 MG PO CAPS
100.0000 mg | ORAL_CAPSULE | Freq: Two times a day (BID) | ORAL | Status: DC
  Administered 2016-08-05 – 2016-08-11 (×10): 100 mg via ORAL
  Filled 2016-08-05 (×10): qty 1

## 2016-08-05 MED ORDER — OSELTAMIVIR PHOSPHATE 30 MG PO CAPS
30.0000 mg | ORAL_CAPSULE | Freq: Two times a day (BID) | ORAL | Status: AC
  Administered 2016-08-05 – 2016-08-10 (×10): 30 mg via ORAL
  Filled 2016-08-05 (×11): qty 1

## 2016-08-05 MED ORDER — WARFARIN SODIUM 5 MG PO TABS
2.5000 mg | ORAL_TABLET | ORAL | Status: DC
  Administered 2016-08-05: 2.5 mg via ORAL
  Filled 2016-08-05: qty 1

## 2016-08-05 MED ORDER — ONDANSETRON HCL 4 MG PO TABS: 4.0000 mg | ORAL_TABLET | Freq: Four times a day (QID) | ORAL | Status: DC | PRN

## 2016-08-05 MED ORDER — ACETAMINOPHEN 325 MG PO TABS
650.0000 mg | ORAL_TABLET | Freq: Four times a day (QID) | ORAL | Status: DC | PRN
  Administered 2016-08-06 – 2016-08-11 (×4): 650 mg via ORAL
  Filled 2016-08-05 (×4): qty 2

## 2016-08-05 MED ORDER — IPRATROPIUM-ALBUTEROL 0.5-2.5 (3) MG/3ML IN SOLN
3.0000 mL | Freq: Once | RESPIRATORY_TRACT | Status: AC
  Administered 2016-08-05: 3 mL via RESPIRATORY_TRACT
  Filled 2016-08-05: qty 3

## 2016-08-05 MED ORDER — WARFARIN - PHYSICIAN DOSING INPATIENT: Freq: Every day | Status: DC

## 2016-08-05 MED ORDER — ENOXAPARIN SODIUM 40 MG/0.4ML ~~LOC~~ SOLN
40.0000 mg | SUBCUTANEOUS | Status: DC
  Administered 2016-08-05: 40 mg via SUBCUTANEOUS
  Filled 2016-08-05: qty 0.4

## 2016-08-05 NOTE — ED Notes (Signed)
Admitting at bedside 

## 2016-08-05 NOTE — ED Provider Notes (Signed)
[facility] Emergency Department Provider Note        Time seen: ----------------------------------------- 11:27 AM on 08/05/2016 -----------------------------------------    I have reviewed the triage vital signs and the nursing notes.   HISTORY  Chief Complaint Fever and Cough    HPI [name] is a 83 y.o. female who presents to the ER for weakness that has been persistent after a recent fall. EMS arrived to find the patient mildly hypoxic. She was placed on 2 L of nasal cannula prior to evaluation. EMS also reports she's had significant cough and was febrile with a strong urine smell. Oxygen saturations were 80% prior to nasal cannula oxygen.   No past medical history on file.  Patient Active Problem List   Diagnosis Date Noted  . Intractable back pain 08/01/2016    No past surgical history on file.  Allergies Aspirin  Social History Social History  Substance Use Topics  . Smoking status: Never Smoker  . Smokeless tobacco: Never Used  . Alcohol use Not on file    Review of Systems Constitutional: Positive for fever Cardiovascular: Negative for chest pain. Respiratory: Negative for shortness of breath. Positive for cough Gastrointestinal: Negative for abdominal pain, vomiting and diarrhea. Musculoskeletal: Negative for back pain. Skin: Negative for rash. Neurological: Negative for headaches, focal weakness or numbness.  10-point ROS otherwise negative.  ____________________________________________   PHYSICAL EXAM:  VITAL SIGNS: ED Triage Vitals  Enc Vitals Group     BP      Pulse      Resp      Temp      Temp src      SpO2      Weight      Height      Head Circumference      Peak Flow      Pain Score      Pain Loc      Pain Edu?      Excl. in GC?     Constitutional: Alert and oriented. Well appearing and in no distress. Eyes: Conjunctivae are normal. PERRL. Normal extraocular movements. ENT   Head:  Normocephalic and atraumatic.   Nose: No congestion/rhinnorhea.   Mouth/Throat: Mucous membranes are moist.   Neck: No stridor. Cardiovascular: Normal rate, regular rhythm. No murmurs, rubs, or gallops. Respiratory: Normal respiratory effort without tachypnea nor retractions.Rhonchi bilaterally Gastrointestinal: Soft and nontender. Normal bowel sounds Musculoskeletal: Nontender with normal range of motion in all extremities. No lower extremity tenderness nor edema. Neurologic:  Normal speech and language. No gross focal neurologic deficits are appreciated.  Skin:  Skin is warm, dry and intact. No rash noted. Psychiatric: Mood and affect are normal. Speech and behavior are normal.  ____________________________________________  EKG: Interpreted by me. Sinus rhythm rate 80 bpm, normal PR interval, normal QT. Left axis deviation, LVH  ____________________________________________  ED COURSE:  Pertinent labs & imaging results that were available during my care of the patient were reviewed by me and considered in my medical decision making (see chart for details). Patient presents to the ER for recent fall and persistent weakness. We will assess with labs and imaging.   Procedures ____________________________________________   LABS (pertinent positives/negatives)  Labs Reviewed  COMPREHENSIVE METABOLIC PANEL - Abnormal; Notable for the following:       Result Value   Glucose, Bld 111 (*)    BUN 33 (*)    Creatinine, Ser 1.42 (*)    Calcium 8.1 (*)    AST  44 (*)    GFR calc non Af Amer 33 (*)    GFR calc Af Amer 38 (*)    All other components within normal limits  CBC WITH DIFFERENTIAL/PLATELET - Abnormal; Notable for the following:    HCT 34.4 (*)    Monocytes Absolute 1.1 (*)    All other components within normal limits  BLOOD GAS, VENOUS - Abnormal; Notable for the following:    pO2, Ven 77.0 (*)    Acid-Base Excess 2.3 (*)    All other components within normal limits   URINALYSIS, COMPLETE (UACMP) WITH MICROSCOPIC - Abnormal; Notable for the following:    APPearance CLOUDY (*)    Hgb urine dipstick MODERATE (*)    Protein, ur 30 (*)    Squamous Epithelial / LPF 6-30 (*)    Bacteria, UA FEW (*)    All other components within normal limits  INFLUENZA PANEL BY PCR (TYPE A & B) - Abnormal; Notable for the following:    Influenza A By PCR POSITIVE (*)    All other components within normal limits  PROTIME-INR - Abnormal; Notable for the following:    Prothrombin Time 22.9 (*)    All other components within normal limits  CULTURE, BLOOD (ROUTINE X 2)  CULTURE, BLOOD (ROUTINE X 2)  URINE CULTURE  LACTIC ACID, PLASMA  TROPONIN I    RADIOLOGY Images were viewed by me  Chest x-ray IMPRESSION: No acute finding. ____________________________________________  FINAL ASSESSMENT AND PLAN  Weakness, Hypoxia, influenza  Plan: Patient with labs and imaging as dictated above. Patient presented to the ER with hypoxia and persistent weakness. Initially it was thought she may have a UTI but results are consistent with influenza. We will start her on Tamiflu, give supplemental oxygen. I will discuss with the hospitalist for admission.   Emily Filbert, MD   Note: This note was generated in part or whole with voice recognition software. Voice recognition is usually quite accurate but there are transcription errors that can and very often do occur. I apologize for any typographical errors that were not detected and corrected.     Emily Filbert, MD 08/05/16 [phone]

## 2016-08-05 NOTE — ED Notes (Signed)
Pt states she has been feeling bad x 1 week. States fever and cough. Pt has moderate non-productive cough when this Rn is in room, family at bedside. Pt alert and oriented.

## 2016-08-05 NOTE — ED Notes (Signed)
Pt sitting slightly up in bed, asleep, snoring noted. Pt lights turned on, previously stated she didn't want them turned off. Sheet covering pt. Family has gone home.

## 2016-08-05 NOTE — Progress Notes (Signed)
Anticoagulation monitoring(Lovenox):  83 yo female ordered Lovenox 30 mg Q24h  Filed Weights   08/05/16 1129  Weight: 240 lb (108.9 kg)   BMI 38.8    Lab Results  Component Value Date   CREATININE 1.42 (H) 08/05/2016   CREATININE 1.37 (H) 04/12/2012   CREATININE 1.33 (H) 04/11/2012   Estimated Creatinine Clearance: 36.8 mL/min (by C-G formula based on SCr of 1.42 mg/dL (H)). Hemoglobin & Hematocrit     Component Value Date/Time   HGB 12.0 08/05/2016 1154   HGB 9.5 (L) 04/12/2012 0509   HCT 34.4 (L) 08/05/2016 1154   HCT 27.0 (L) 04/12/2012 [id number]     Per Protocol for Patient with estCrcl > 30 ml/min and BMI < 40, will transition to Lovenox 40 mg Q24h.

## 2016-08-05 NOTE — ED Notes (Signed)
Pt states she is feeling better, done eating, family has gone home.

## 2016-08-05 NOTE — ED Triage Notes (Signed)
Pt arrived via EMS from home for reports of fever, cough and strong smell to urine. EMS reports high 80s SpO2 on RA, high 90s SpO2 on 2L O2. VSS per EMS.

## 2016-08-05 NOTE — ED Notes (Signed)
Transporting patient to room 211-2C with EDT Raynelle FanningJulie

## 2016-08-05 NOTE — ED Notes (Signed)
In and out completed by this Gordon with [name] help. Pt stated she has no control over bladder and had previously urinated in brief. Brief taken off, wet linen underneath pt removed and pt cleaned. Chucks and new brief placed under pt prior to in and out cath. Very small urine return but sent to lab.

## 2016-08-05 NOTE — ED Notes (Signed)
Pt eating food that family brought her. Provided water.

## 2016-08-05 NOTE — Progress Notes (Signed)
ANTIBIOTIC CONSULT NOTE - INITIAL  Pharmacy Consult for Tamiflu (oseltamivir)  Indication: flu  Allergies  Allergen Reactions  . Aspirin Nausea Only    Abdominal pain    Patient Measurements: Height: 5\' 6"  (167.6 cm) Weight: 240 lb (108.9 kg) IBW/kg (Calculated) : 59.3 Adjusted Body Weight:   Vital Signs: Temp: 99.6 F (37.6 C) (01/30 1129) Temp Source: Oral (01/30 1129) BP: 123/49 (01/30 1820) Pulse Rate: 71 (01/30 1820) Intake/Output from previous day: No intake/output data recorded. Intake/Output from this shift: No intake/output data recorded.  Labs:  Recent Labs  08/05/16 1154  WBC 7.0  HGB 12.0  PLT 177  CREATININE 1.42*   Estimated Creatinine Clearance: 36.8 mL/min (by C-G formula based on SCr of 1.42 mg/dL (H)). No results for input(s): VANCOTROUGH, VANCOPEAK, VANCORANDOM, GENTTROUGH, GENTPEAK, GENTRANDOM, TOBRATROUGH, TOBRAPEAK, TOBRARND, AMIKACINPEAK, AMIKACINTROU, AMIKACIN in the last 72 hours.   Microbiology: No results found for this or any previous visit (from the past 720 hour(s)).  Medical History: No past medical history on file.  Medications:  Prescriptions Prior to Admission  Medication Sig Dispense Refill Last Dose  . acetaminophen (TYLENOL) 500 MG tablet Take 2 tablets (1,000 mg total) by mouth every 8 (eight) hours. 100 tablet 0 08/05/2016 at 0800  . lidocaine (LIDODERM) 5 % Place 1 patch onto the skin daily. Remove & Discard patch within 12 hours or as directed by MD 5 patch 0 08/04/2016 at 2000  . Multiple Vitamins-Minerals (PRESERVISION AREDS 2+MULTI VIT PO) Take 1 tablet by mouth 2 (two) times daily.   08/04/2016 at 2000  . oxyCODONE (ROXICODONE) 5 MG immediate release tablet Take 1 tablet (5 mg total) by mouth every 8 (eight) hours as needed. 12 tablet 0 08/04/2016 at 2000  . senna (SENOKOT) 8.6 MG TABS tablet Take 1 tablet (8.6 mg total) by mouth at bedtime. 30 each 0 08/04/2016 at 2000  . warfarin (COUMADIN) 2.5 MG tablet Take 2.5 mg by  mouth. Take 2.5mg  every Sun, Tues, thurs and sat   08/03/2016 at 2000  . warfarin (COUMADIN) 4 MG tablet Take 4 mg by mouth every Monday, Wednesday, and Friday.    08/04/2016 at 2000   Assessment: CrCl = 36.8 ml/min   Goal of Therapy:  resolution of infection  Plan:  Tamiflu 75 mg PO BID originally ordered.  Will adjust dose to tamiflu 30 mg PO BID to start 1/30 @ 22:00.   Marcelo Ickes D 08/05/2016,7:19 PM

## 2016-08-05 NOTE — H&P (Addendum)
[facility] [place] at [facility]   PATIENT NAME: [name]    MR#:  [id number]  DATE OF BIRTH:  Oct 06, 1929  DATE OF ADMISSION:  08/05/2016  PRIMARY CARE PHYSICIAN: Etheleen NicksKeri MacDonald, NP   REQUESTING/REFERRING PHYSICIAN:   CHIEF COMPLAINT:   Chief Complaint  Patient presents with  . Fever  . Cough    HISTORY OF PRESENT ILLNESS: [name]  is a 83 y.o. female with a known history of Recurrent DVTs, on anticoagulation therapy with Coumadin, former smoker, history of COPD, recent admission to the hospital from the 26th through January 28 for fall and left hip pain, who presents back to the hospital not feeling well, having some fevers, fatigue, weakness, coughing, but not able to produce any phlegm. On arrival to emergency room, she was noted to be moderately hypoxic with O2 sats of 97 on room air, she was dyspneic, labs revealed positivity for influenza A, hospitalist services were contacted for admission. The chest x-ray was unremarkable.  PAST MEDICAL HISTORY:  No past medical history on file.  PAST SURGICAL HISTORY: No past surgical history on file.  SOCIAL HISTORY:  Social History  Substance Use Topics  . Smoking status: Never Smoker  . Smokeless tobacco: Never Used  . Alcohol use Not on file    FAMILY HISTORY: No acute coronary syndrome in early age  DRUG ALLERGIES:  Allergies  Allergen Reactions  . Aspirin Nausea Only    Abdominal pain    Review of Systems  Constitutional: Positive for chills, fever and malaise/fatigue. Negative for weight loss.  HENT: Negative for congestion.   Eyes: Negative for blurred vision and double vision.  Respiratory: Positive for cough and shortness of breath. Negative for sputum production and wheezing.   Cardiovascular: Negative for chest pain, palpitations, orthopnea, leg swelling and PND.  Gastrointestinal: Positive for constipation. Negative for abdominal pain, blood in stool, diarrhea, nausea and vomiting.   Genitourinary: Negative for dysuria, frequency, hematuria and urgency.  Musculoskeletal: Negative for falls.  Neurological: Negative for dizziness, tremors, focal weakness and headaches.  Endo/Heme/Allergies: Does not bruise/bleed easily.  Psychiatric/Behavioral: Negative for depression. The patient does not have insomnia.     MEDICATIONS AT HOME:  Prior to Admission medications   Medication Sig Start Date End Date Taking? Authorizing Provider  acetaminophen (TYLENOL) 500 MG tablet Take 2 tablets (1,000 mg total) by mouth every 8 (eight) hours. 08/01/16 08/05/16 Yes Nita Sicklearolina Veronese, MD  lidocaine (LIDODERM) 5 % Place 1 patch onto the skin daily. Remove & Discard patch within 12 hours or as directed by MD 08/03/16  Yes Shaune PollackQing Chen, MD  Multiple Vitamins-Minerals (PRESERVISION AREDS 2+MULTI VIT PO) Take 1 tablet by mouth 2 (two) times daily.   Yes Historical Provider, MD  oxyCODONE (ROXICODONE) 5 MG immediate release tablet Take 1 tablet (5 mg total) by mouth every 8 (eight) hours as needed. 08/03/16 08/03/17 Yes Shaune PollackQing Chen, MD  senna (SENOKOT) 8.6 MG TABS tablet Take 1 tablet (8.6 mg total) by mouth at bedtime. 08/01/16  Yes Nita Sicklearolina Veronese, MD  warfarin (COUMADIN) 2.5 MG tablet Take 2.5 mg by mouth. Take 2.5mg  every Sun, Tues, thurs and sat   Yes Historical Provider, MD  warfarin (COUMADIN) 4 MG tablet Take 4 mg by mouth every Monday, Wednesday, and Friday.    Yes Historical Provider, MD      PHYSICAL EXAMINATION:   VITAL SIGNS: Blood pressure 101/84, pulse 75, temperature 99.6 F (37.6 C), temperature source Oral, resp. rate 16, height 5'  6" (1.676 m), weight 108.9 kg (240 lb), SpO2 95 %.  GENERAL:  83 y.o.-year-old patient lying in the bed in mild-to-moderate respiratory distress, coughing intermittently, and efficiently, and comfortable, tachypneic.  EYES: Pupils equal, round, reactive to light and accommodation. No scleral icterus. Extraocular muscles intact.  HEENT: Head atraumatic,  normocephalic. Oropharynx and nasopharynx clear.  NECK:  Supple, no jugular venous distention. No thyroid enlargement, no tenderness.  LUNGS: Some diminished breath sounds bilaterally, no wheezing, bilateral scattered rales,rhonchi and crepitations in both lung fields. Intermittent use of accessory muscles of respiration.  CARDIOVASCULAR: S1, S2 normal. No murmurs, rubs, or gallops.  ABDOMEN: Soft, nontender, nondistended. Bowel sounds present. No organomegaly or mass.  EXTREMITIES: No pedal edema, cyanosis, or clubbing.  NEUROLOGIC: Cranial nerves II through XII are intact. Muscle strength 5/5 in all extremities. Sensation intact. Gait not checked. Patient has difficulty even sitting up in the bed due to significant lower back pain, she needs help from her son PSYCHIATRIC: The patient is alert and oriented x 3.  SKIN: No obvious rash, lesion, or ulcer.   LABORATORY PANEL:   CBC  Recent Labs Lab 08/01/16 1726 08/02/16 0338 08/05/16 1154  WBC 8.1 6.9 7.0  HGB 12.0 12.5 12.0  HCT 36.1 36.4 34.4*  PLT 188 182 177  MCV 91.5 89.5 90.1  MCH 30.4 30.9 31.5  MCHC 33.3 34.5 34.9  RDW 13.6 13.7 13.6  LYMPHSABS  --   --  1.3  MONOABS  --   --  1.1*  EOSABS  --   --  0.1  BASOSABS  --   --  0.1   ------------------------------------------------------------------------------------------------------------------  Chemistries   Recent Labs Lab 08/05/16 1154  NA 138  K 4.1  CL 104  CO2 24  GLUCOSE 111*  BUN 33*  CREATININE 1.42*  CALCIUM 8.1*  AST 44*  ALT 16  ALKPHOS 52  BILITOT 0.9   ------------------------------------------------------------------------------------------------------------------  Cardiac Enzymes  Recent Labs Lab 08/05/16 1154  TROPONINI <0.03   ------------------------------------------------------------------------------------------------------------------  RADIOLOGY: Dg Chest Port 1 View  Result Date: 08/05/2016 CLINICAL DATA:  Fever and   nonproductive cough. EXAM: PORTABLE CHEST 1 VIEW COMPARISON:  04/11/2012 FINDINGS: Normal heart size. Stable aortic contours when accounting for distortion by rightward rotation. There is no edema, consolidation, effusion, or pneumothorax. Osteopenia and reverse glenohumeral arthroplasty on the left. IMPRESSION: No acute finding. Electronically Signed   By: Marnee Spring M.D.   On: 08/05/2016 12:33    EKG: Orders placed or performed during the hospital encounter of 08/05/16  . ED EKG 12-Lead  . ED EKG 12-Lead  . EKG 12-Lead  . EKG 12-Lead   EKG in emergency room revealed sinus rhythm at 80 bpm, left axis deviation, probable left atrial enlargement per EKG criteria, LVH with repolarization abnormalities, nonspecific ST-T changes IMPRESSION AND PLAN:  Active Problems:   Influenza   Dyspnea   Constipation   Back pain   Generalized weakness #1. Influenza A, admit patient to medical floor, supportive therapy, Tamiflu, follow clinically #2. Bronchitis, questionable bacterial, get sputum culture if purulent, initiate patient on nebulizing therapy. Initiate antibiotic therapy if purulence is confirmed, patient's white blood cell count is normal, I'm not initiating antibiotics at this time #3 Constipation, initiate numerous medications #4. Back pain, continue outpatient medications, initiate physical therapy #5. Generalized weakness as above. Physical therapy evaluate patient 6. Chronic renal insufficiency, CK D stage III, stable since before #7. Chronic DVT, continue current Coumadin therapy   All the records are reviewed and  case discussed with ED provider. Management plans discussed with the patient, family and they are in agreement.  CODE STATUS: Code Status History    Date Active Date Inactive Code Status Order ID Comments User Context   08/01/2016  8:27 PM 08/03/2016  4:28 PM DNR [id number]  Enedina Finner, MD ED    Questions for Most Recent Historical Code Status (Order [id number])     Question Answer Comment   In the event of cardiac or respiratory ARREST Do not call a "code blue"    In the event of cardiac or respiratory ARREST Do not perform Intubation, CPR, defibrillation or ACLS    In the event of cardiac or respiratory ARREST Use medication by any route, position, wound care, and other measures to relive pain and suffering. May use oxygen, suction and manual treatment of airway obstruction as needed for comfort.        TOTAL TIME TAKING CARE OF THIS PATIENT: 55 minutes.    Katharina Caper M.D on 08/05/2016 at 4:01 PM  Between 7am to 6pm - Pager - [phone] After 6pm go to [email or web address] - password [email or web address] [facility]  [place] [facility]  Office  [phone]  CC: Primary care physician; Etheleen Nicks, NP

## 2016-08-06 LAB — CBC
HCT: 35.2 % (ref 35.0–47.0)
Hemoglobin: 11.9 g/dL — ABNORMAL LOW (ref 12.0–16.0)
MCH: 30.8 pg (ref 26.0–34.0)
MCHC: 33.7 g/dL (ref 32.0–36.0)
MCV: 91.5 fL (ref 80.0–100.0)
PLATELETS: 166 10*3/uL (ref 150–440)
RBC: 3.85 MIL/uL (ref 3.80–5.20)
RDW: 13.6 % (ref 11.5–14.5)
WBC: 5.7 10*3/uL (ref 3.6–11.0)

## 2016-08-06 LAB — BASIC METABOLIC PANEL
Anion gap: 9 (ref 5–15)
BUN: 33 mg/dL — ABNORMAL HIGH (ref 6–20)
CHLORIDE: 104 mmol/L (ref 101–111)
CO2: 26 mmol/L (ref 22–32)
CREATININE: 1.37 mg/dL — AB (ref 0.44–1.00)
Calcium: 8.1 mg/dL — ABNORMAL LOW (ref 8.9–10.3)
GFR calc non Af Amer: 34 mL/min — ABNORMAL LOW (ref >60)
GFR, EST AFRICAN AMERICAN: 40 mL/min — AB (ref >60)
Glucose, Bld: 118 mg/dL — ABNORMAL HIGH (ref 65–99)
Potassium: 4.2 mmol/L (ref 3.5–5.1)
SODIUM: 139 mmol/L (ref 135–145)

## 2016-08-06 LAB — PROTIME-INR
INR: 1.93
Prothrombin Time: 22.3 seconds — ABNORMAL HIGH (ref 11.4–15.2)

## 2016-08-06 LAB — URINE CULTURE: Culture: NO GROWTH

## 2016-08-06 LAB — GLUCOSE, CAPILLARY: Glucose-Capillary: 109 mg/dL — ABNORMAL HIGH (ref 65–99)

## 2016-08-06 MED ORDER — ALBUTEROL SULFATE (2.5 MG/3ML) 0.083% IN NEBU: 2.5000 mg | INHALATION_SOLUTION | RESPIRATORY_TRACT | Status: DC | PRN

## 2016-08-06 MED ORDER — BUDESONIDE 0.5 MG/2ML IN SUSP
0.5000 mg | Freq: Two times a day (BID) | RESPIRATORY_TRACT | Status: DC
  Administered 2016-08-06 – 2016-08-11 (×10): 0.5 mg via RESPIRATORY_TRACT
  Filled 2016-08-06 (×11): qty 2

## 2016-08-06 MED ORDER — ALBUTEROL SULFATE (2.5 MG/3ML) 0.083% IN NEBU
2.5000 mg | INHALATION_SOLUTION | Freq: Four times a day (QID) | RESPIRATORY_TRACT | Status: DC
  Administered 2016-08-06 – 2016-08-07 (×4): 2.5 mg via RESPIRATORY_TRACT
  Filled 2016-08-06 (×4): qty 3

## 2016-08-06 NOTE — Evaluation (Signed)
Physical Therapy Evaluation Patient Details Name: [name] MRN: [id number] DOB: 1929-11-23 Today's Date: 08/06/2016   History of Present Illness  Pt admitted for influenza. Pt with recent admission for LBP following fall onto R hip. Pt now with complaints of SOB, cough and fever. 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  Clinical Impression  Pt is a pleasant 83 year old female who was admitted for influenza. Pt performs bed mobility with max assist and unable to further perform OOB mobility secondary to severe pain and weakness. Unable to ambulate at this time. Pt demonstrates deficits with strength/mobility/pain. Pt very high risk for falls secondary to strength deficits. Not at baseline level at this time. During evaluation pt on 2L of O2 with sats WNL, no SOB symptoms noted. Would benefit from skilled PT to address above deficits and promote optimal return to PLOF; recommend transition to STR upon discharge from acute hospitalization.       Follow Up Recommendations SNF    Equipment Recommendations  3in1 (PT)    Recommendations for Other Services       Precautions / Restrictions Precautions Precautions: Fall;Back Precaution Booklet Issued: No Restrictions Weight Bearing Restrictions: No      Mobility  Bed Mobility Overal bed mobility: Needs Assistance Bed Mobility: Supine to Sit;Sit to Supine     Supine to sit: Max assist     General bed mobility comments: assist for moving B LEs off bed and heavy assist for trunk support. Once seated at EOB, only able to tolerate sitting for a few minutes prior to requesting to return supine secondary to pain. Unable to further assess OOB mobility at this time.  Transfers                 General transfer comment: unable secondary to intense pain  Ambulation/Gait                Stairs            Wheelchair Mobility    Modified Rankin (Stroke Patients  Only)       Balance Overall balance assessment: History of Falls;Needs assistance Sitting-balance support: Feet unsupported;Bilateral upper extremity supported Sitting balance-Leahy Scale: Fair                                       Pertinent Vitals/Pain Pain Assessment: 0-10 Pain Score: 8  Pain Location: back with transition to sit and with sitting Pain Descriptors / Indicators: Aching Pain Intervention(s): Limited activity within patient's tolerance;Repositioned    Home Living Family/patient expects to be discharged to:: Private residence Living Arrangements: Spouse/significant other Available Help at Discharge: Family;Available PRN/intermittently Type of Home: House Home Access: Stairs to enter Entrance Stairs-Rails: Can reach both Entrance Stairs-Number of Steps: 4 Home Layout: One level Home Equipment: Walker - 2 wheels;Cane - single point      Prior Function Level of Independence: Needs assistance         Comments: previously independent, however since previous admission has been very limited in mobility, mostly residing in chair.      Hand Dominance        Extremity/Trunk Assessment   Upper Extremity Assessment Upper Extremity Assessment: Generalized weakness (B UE grossly 3+/5)    Lower Extremity Assessment Lower Extremity Assessment: Generalized weakness (B LE grossly 3+/5 with R>L secondary to pain)  Communication   Communication: No difficulties  Cognition Arousal/Alertness: Awake/alert Behavior During Therapy: WFL for tasks assessed/performed Overall Cognitive Status: Within Functional Limits for tasks assessed                      General Comments      Exercises Other Exercises Other Exercises: supine ther-ex performed including B ankle pumps, quad sets, SLRs, hip abd/add, and hip add squeezes. All ther-ex performed x 10 reps with cga. THerapist wrote HEP on board for participation while in hospital. Explained  frequency and duration.    Assessment/Plan    PT Assessment Patient needs continued PT services  PT Problem List Decreased strength;Decreased activity tolerance;Decreased balance;Decreased mobility;Pain;Obesity          PT Treatment Interventions DME instruction;Gait training;Therapeutic activities;Therapeutic exercise    PT Goals (Current goals can be found in the Care Plan section)  Acute Rehab PT Goals Patient Stated Goal: to get better PT Goal Formulation: With patient Time For Goal Achievement: 08/20/16 Potential to Achieve Goals: Good    Frequency Min 2X/week   Barriers to discharge Decreased caregiver support;Inaccessible home environment      Co-evaluation               End of Session Equipment Utilized During Treatment: Gait belt Activity Tolerance: Patient limited by pain Patient left: in bed;with bed alarm set;with family/visitor present Nurse Communication: Mobility status    Functional Assessment Tool Used: clinical judgment Functional Limitation: Mobility: Walking and moving around Mobility: Walking and Moving Around Current Status (Z6109): At least 40 percent but less than 60 percent impaired, limited or restricted Mobility: Walking and Moving Around Goal Status [id number]): At least 20 percent but less than 40 percent impaired, limited or restricted    Time: 1542-1600 PT Time Calculation (min) (ACUTE ONLY): 18 min   Charges:   PT Evaluation $PT Eval Moderate Complexity: 1 Procedure PT Treatments $Therapeutic Exercise: 8-22 mins   PT G Codes:   PT G-Codes **NOT FOR INPATIENT CLASS** Functional Assessment Tool Used: clinical judgment Functional Limitation: Mobility: Walking and moving around Mobility: Walking and Moving Around Current Status (U9811): At least 40 percent but less than 60 percent impaired, limited or restricted Mobility: Walking and Moving Around Goal Status [phone]): At least 20 percent but less than 40 percent impaired, limited or  restricted    [name] 08/06/2016, 5:09 PM  Elizabeth Palau, PT, DPT [phone]

## 2016-08-06 NOTE — Progress Notes (Signed)
[facility] [place] at [facility]   PATIENT NAME: Siyona Coto    MR#:  [id number]  DATE OF BIRTH:  12-Nov-1929  SUBJECTIVE:   Patient here due to acute respiratory failure with hypoxia secondary to the flu. Still has some wheezing and bronchospasm. No other acute events overnight.  REVIEW OF SYSTEMS:    Review of Systems  Constitutional: Negative for chills and fever.  HENT: Negative for congestion and tinnitus.   Eyes: Negative for blurred vision and double vision.  Respiratory: Positive for cough, shortness of breath and wheezing.   Cardiovascular: Negative for chest pain, orthopnea and PND.  Gastrointestinal: Negative for abdominal pain, diarrhea, nausea and vomiting.  Genitourinary: Negative for dysuria and hematuria.  Neurological: Positive for weakness. Negative for dizziness, sensory change and focal weakness.  All other systems reviewed and are negative.   Nutrition: Heart Healthy Tolerating Diet: Yes Tolerating PT: Await Eval.    DRUG ALLERGIES:   Allergies  Allergen Reactions  . Aspirin Nausea Only    Abdominal pain    VITALS:  Blood pressure (!) 121/52, pulse 86, temperature 97.5 F (36.4 C), resp. rate 16, height 5\' 9"  (1.753 m), weight 91.6 kg (202 lb), SpO2 98 %.  PHYSICAL EXAMINATION:   Physical Exam  GENERAL:  83 y.o.-year-old patient lying in the bed in mild resp. Distress.  EYES: Pupils equal, round, reactive to light and accommodation. No scleral icterus. Extraocular muscles intact.  HEENT: Head atraumatic, normocephalic. Oropharynx and nasopharynx clear.  NECK:  Supple, no jugular venous distention. No thyroid enlargement, no tenderness.  LUNGS: Inspiratory and expiratory phase, and expiratory wheezing bilaterally, no rales, rhonchi. No use of accessory muscles of respiration.  CARDIOVASCULAR: S1, S2 normal. No murmurs, rubs, or gallops.  ABDOMEN: Soft, nontender, nondistended. Bowel sounds present. No organomegaly or mass.   EXTREMITIES: No cyanosis, clubbing or edema b/l.    NEUROLOGIC: Cranial nerves II through XII are intact. No focal Motor or sensory deficits b/l. Globally weak   PSYCHIATRIC: The patient is alert and oriented x 3.  SKIN: No obvious rash, lesion, or ulcer.    LABORATORY PANEL:   CBC  Recent Labs Lab 08/06/16 0606  WBC 5.7  HGB 11.9*  HCT 35.2  PLT 166   ------------------------------------------------------------------------------------------------------------------  Chemistries   Recent Labs Lab 08/05/16 1154 08/06/16 0606  NA 138 139  K 4.1 4.2  CL 104 104  CO2 24 26  GLUCOSE 111* 118*  BUN 33* 33*  CREATININE 1.42* 1.37*  CALCIUM 8.1* 8.1*  AST 44*  --   ALT 16  --   ALKPHOS 52  --   BILITOT 0.9  --    ------------------------------------------------------------------------------------------------------------------  Cardiac Enzymes  Recent Labs Lab 08/05/16 1154  TROPONINI <0.03   ------------------------------------------------------------------------------------------------------------------  RADIOLOGY:  Dg Chest Port 1 View  Result Date: 08/05/2016 CLINICAL DATA:  Fever and  nonproductive cough. EXAM: PORTABLE CHEST 1 VIEW COMPARISON:  04/11/2012 FINDINGS: Normal heart size. Stable aortic contours when accounting for distortion by rightward rotation. There is no edema, consolidation, effusion, or pneumothorax. Osteopenia and reverse glenohumeral arthroplasty on the left. IMPRESSION: No acute finding. Electronically Signed   By: Marnee Spring M.D.   On: 08/05/2016 12:33     ASSESSMENT AND PLAN:   83 year old female with past medical history of COPD, previous history of DVT who presents to the hospital due to shortness of breath cough and weakness.   1. Acute respiratory failure with hypoxia-secondary to the flu. -Continue O2 supplementation is, supportive  care.  2. Flu-patient is positive for influenza A -Continue droplet precautions, continue  Tamiflu.  3. Acute bronchitis - due to flu.  - cont. Duonebs.  Will add Pulmicort nebs.   4. Hx of DVT - cont. Coumadin.     All the records are reviewed and case discussed with Care Management/Social Worker. Management plans discussed with the patient, family and they are in agreement.  CODE STATUS: Full  DVT Prophylaxis: Coumadin  TOTAL TIME TAKING CARE OF THIS PATIENT: 25 minutes.   POSSIBLE D/C IN 1-2 DAYS, DEPENDING ON CLINICAL CONDITION.   Houston SirenSAINANI,Sundee Garland J M.D on 08/06/2016 at 2:41 PM  Between 7am to 6pm - Pager - [phone]  After 6pm go to [email or web address] - Social research officer, governmentpassword [email or web address]  [employer] [place] [facility]  Office  [phone]  CC: Primary care physician; Etheleen NicksKeri MacDonald, NP

## 2016-08-06 NOTE — Progress Notes (Signed)
Per Dr. Cherlynn KaiserSainani okay to discontinue tele and continuous pulse ox

## 2016-08-07 ENCOUNTER — Ambulatory Visit: Payer: Medicare Other | Admitting: Surgery

## 2016-08-07 DIAGNOSIS — K5900 Constipation, unspecified: Secondary | ICD-10-CM | POA: Diagnosis present

## 2016-08-07 DIAGNOSIS — G8929 Other chronic pain: Secondary | ICD-10-CM | POA: Diagnosis present

## 2016-08-07 DIAGNOSIS — J440 Chronic obstructive pulmonary disease with acute lower respiratory infection: Secondary | ICD-10-CM | POA: Diagnosis present

## 2016-08-07 DIAGNOSIS — J9601 Acute respiratory failure with hypoxia: Secondary | ICD-10-CM | POA: Diagnosis present

## 2016-08-07 DIAGNOSIS — R531 Weakness: Secondary | ICD-10-CM | POA: Diagnosis present

## 2016-08-07 DIAGNOSIS — J09X2 Influenza due to identified novel influenza A virus with other respiratory manifestations: Secondary | ICD-10-CM | POA: Diagnosis present

## 2016-08-07 DIAGNOSIS — J209 Acute bronchitis, unspecified: Secondary | ICD-10-CM | POA: Diagnosis present

## 2016-08-07 DIAGNOSIS — Z66 Do not resuscitate: Secondary | ICD-10-CM | POA: Diagnosis present

## 2016-08-07 DIAGNOSIS — Z7901 Long term (current) use of anticoagulants: Secondary | ICD-10-CM | POA: Diagnosis not present

## 2016-08-07 DIAGNOSIS — N183 Chronic kidney disease, stage 3 (moderate): Secondary | ICD-10-CM | POA: Diagnosis present

## 2016-08-07 DIAGNOSIS — Z87891 Personal history of nicotine dependence: Secondary | ICD-10-CM | POA: Diagnosis not present

## 2016-08-07 DIAGNOSIS — Z86718 Personal history of other venous thrombosis and embolism: Secondary | ICD-10-CM | POA: Diagnosis not present

## 2016-08-07 DIAGNOSIS — M545 Low back pain: Secondary | ICD-10-CM | POA: Diagnosis present

## 2016-08-07 DIAGNOSIS — F419 Anxiety disorder, unspecified: Secondary | ICD-10-CM | POA: Diagnosis present

## 2016-08-07 DIAGNOSIS — J441 Chronic obstructive pulmonary disease with (acute) exacerbation: Secondary | ICD-10-CM | POA: Diagnosis present

## 2016-08-07 LAB — PROTIME-INR
INR: 1.85
Prothrombin Time: 21.6 seconds — ABNORMAL HIGH (ref 11.4–15.2)

## 2016-08-07 LAB — GLUCOSE, CAPILLARY: Glucose-Capillary: 106 mg/dL — ABNORMAL HIGH (ref 65–99)

## 2016-08-07 MED ORDER — WARFARIN SODIUM 4 MG PO TABS
4.0000 mg | ORAL_TABLET | Freq: Once | ORAL | Status: AC
  Administered 2016-08-07: 4 mg via ORAL
  Filled 2016-08-07: qty 1

## 2016-08-07 MED ORDER — IPRATROPIUM-ALBUTEROL 0.5-2.5 (3) MG/3ML IN SOLN: 3.0000 mL | Freq: Four times a day (QID) | RESPIRATORY_TRACT | Status: DC

## 2016-08-07 MED ORDER — IPRATROPIUM-ALBUTEROL 0.5-2.5 (3) MG/3ML IN SOLN
3.0000 mL | RESPIRATORY_TRACT | Status: DC
  Administered 2016-08-07 – 2016-08-11 (×21): 3 mL via RESPIRATORY_TRACT
  Filled 2016-08-07 (×21): qty 3

## 2016-08-07 MED ORDER — METHYLPREDNISOLONE SODIUM SUCC 40 MG IJ SOLR
40.0000 mg | Freq: Four times a day (QID) | INTRAMUSCULAR | Status: DC
  Administered 2016-08-07 – 2016-08-08 (×4): 40 mg via INTRAVENOUS
  Filled 2016-08-07 (×4): qty 1

## 2016-08-07 NOTE — Progress Notes (Signed)
Per Pharmacist Barbara CowerJason will give 4mg  dose of coumadin instead of 2.5mg  as pt INR was not therapeutic

## 2016-08-07 NOTE — Progress Notes (Signed)
PT Cancellation Note  Patient Details Name: [name] MRN: [id number] DOB: Apr 01, 1930   Cancelled Treatment:    Reason Eval/Treat Not Completed: Other (comment). Treatment attempted, however pt sound asleep, only briefly opens eyes to tactile/verbal cues. Husband in room and pleased that pt may be able to go to rehab. Will re-attempt therapy at another time when patient more alert.   Andrea Ferrer 08/07/2016, 4:03 PM  Elizabeth PalauStephanie Jadon Ressler, PT, DPT [phone]

## 2016-08-07 NOTE — Plan of Care (Signed)
Problem: Activity: Goal: Risk for activity intolerance will decrease Outcome: Not Progressing Patient is working with PT.  Problem: Bowel/Gastric: Goal: Will not experience complications related to bowel motility Outcome: Progressing Patient has had several bowel movements with the help of laxatives and stool softeners.

## 2016-08-07 NOTE — Clinical Social Work Note (Signed)
CSW aware that patient is requesting STR. At time CSW visited patient, she was sleeping soundly. CSW to begin bed search and will complete assessment once patient is more alert. York SpanielMonica Zarayah Lanting MSW,LCSW [phone]

## 2016-08-07 NOTE — Progress Notes (Signed)
[employer] - [place] at [facility]   PATIENT NAME: [name]    MR#:  [id number]  DATE OF BIRTH:  October 10, 1929  SUBJECTIVE:   Patient here due to acute respiratory failure with hypoxia secondary to the flu. Having significant wheezing and bronchospasm. Also complaining of some Back pain.   REVIEW OF SYSTEMS:    Review of Systems  Constitutional: Negative for chills and fever.  HENT: Negative for congestion and tinnitus.   Eyes: Negative for blurred vision and double vision.  Respiratory: Positive for cough, shortness of breath and wheezing.   Cardiovascular: Negative for chest pain, orthopnea and PND.  Gastrointestinal: Negative for abdominal pain, diarrhea, nausea and vomiting.  Genitourinary: Negative for dysuria and hematuria.  Musculoskeletal: Positive for back pain.  Neurological: Positive for weakness. Negative for dizziness, sensory change and focal weakness.  All other systems reviewed and are negative.   Nutrition: Heart Healthy Tolerating Diet: Yes Tolerating PT:  Eval noted.    DRUG ALLERGIES:   Allergies  Allergen Reactions  . Aspirin Nausea Only    Abdominal pain    VITALS:  Blood pressure (!) 126/50, pulse 77, temperature 98.4 F (36.9 C), temperature source Oral, resp. rate (!) 8, height 5\' 9"  (1.753 m), weight 91.6 kg (202 lb), SpO2 93 %.  PHYSICAL EXAMINATION:   Physical Exam  GENERAL:  83 y.o.-year-old patient lying in the bed in mild resp. Distress.  EYES: Pupils equal, round, reactive to light and accommodation. No scleral icterus. Extraocular muscles intact.  HEENT: Head atraumatic, normocephalic. Oropharynx and nasopharynx clear.  NECK:  Supple, no jugular venous distention. No thyroid enlargement, no tenderness.  LUNGS: Prolonged Inspiratory and expiratory phase, diffuse wheezing bilaterally, no rales, rhonchi. No use of accessory muscles of respiration.  CARDIOVASCULAR: S1, S2 normal. No murmurs, rubs, or gallops.  ABDOMEN:  Soft, nontender, nondistended. Bowel sounds present. No organomegaly or mass.  EXTREMITIES: No cyanosis, clubbing or edema b/l.    NEUROLOGIC: Cranial nerves II through XII are intact. No focal Motor or sensory deficits b/l. Globally weak   PSYCHIATRIC: The patient is alert and oriented x 3.  SKIN: No obvious rash, lesion, or ulcer.    LABORATORY PANEL:   CBC  Recent Labs Lab 08/06/16 0606  WBC 5.7  HGB 11.9*  HCT 35.2  PLT 166   ------------------------------------------------------------------------------------------------------------------  Chemistries   Recent Labs Lab 08/05/16 1154 08/06/16 0606  NA 138 139  K 4.1 4.2  CL 104 104  CO2 24 26  GLUCOSE 111* 118*  BUN 33* 33*  CREATININE 1.42* 1.37*  CALCIUM 8.1* 8.1*  AST 44*  --   ALT 16  --   ALKPHOS 52  --   BILITOT 0.9  --    ------------------------------------------------------------------------------------------------------------------  Cardiac Enzymes  Recent Labs Lab 08/05/16 1154  TROPONINI <0.03   ------------------------------------------------------------------------------------------------------------------  RADIOLOGY:  No results found.   ASSESSMENT AND PLAN:   83 year old female with past medical history of COPD, previous history of DVT who presents to the hospital due to shortness of breath cough and weakness.  1. Acute respiratory failure with hypoxia-secondary to the flu. -Patient having significant wheezing and bronchospasm. I will add IV steroids, continue Pulmicort nebs, will add scheduled DuoNeb's. -Continue O2 supplementation and supportive care.  2. Flu-patient is positive for influenza A -Continue droplet precautions, continue Tamiflu. - cont. Treatment as mentioned above  3. Acute bronchitis - due to flu. Still having significant wheezing bronchospasm - will start on IV steroids, duonebs scheduled, Pulmicort nebs.  4. Hx of DVT - cont. Coumadin.   - pharmacy to dose.    5. Back Pain - CT Lumbar spine, X-ray lumbar spine showing no acute pathology.  - PRN oxy for pain.   PT recommending SNF.   All the records are reviewed and case discussed with Care Management/Social Worker. Management plans discussed with the patient, family and they are in agreement.  CODE STATUS: Full  DVT Prophylaxis: Coumadin  TOTAL TIME TAKING CARE OF THIS PATIENT: 30 minutes.   POSSIBLE D/C IN 1-2 DAYS, DEPENDING ON CLINICAL CONDITION.   Houston Siren M.D on 08/07/2016 at 3:14 PM  Between 7am to 6pm - Pager - [phone]  After 6pm go to [email or web address] - Social research officer, government  [employer] [facility]  Office  [phone]  CC: Primary care physician; Etheleen Nicks, NP

## 2016-08-07 NOTE — Progress Notes (Signed)
ANTICOAGULATION CONSULT NOTE - Initial Consult  Pharmacy Consult for Warfarin  Indication: DVT   Allergies  Allergen Reactions  . Aspirin Nausea Only    Abdominal pain    Patient Measurements: Height: 5\' 9"  (175.3 cm) Weight: 202 lb (91.6 kg) IBW/kg (Calculated) : 66.2 Heparin Dosing Weight:   Vital Signs: Temp: 98.4 F (36.9 C) (02/01 1234) Temp Source: Oral (02/01 1234) BP: 126/50 (02/01 1234) Pulse Rate: 77 (02/01 1234)  Labs:  Recent Labs  08/05/16 1154 08/05/16 1258 08/06/16 0606 08/07/16 0506  HGB 12.0  --  11.9*  --   HCT 34.4*  --  35.2  --   PLT 177  --  166  --   LABPROT  --  22.9* 22.3* 21.6*  INR  --  1.99 1.93 1.85  CREATININE 1.42*  --  1.37*  --   TROPONINI <0.03  --   --   --     Estimated Creatinine Clearance: 36.9 mL/min (by C-G formula based on SCr of 1.37 mg/dL (H)).   Medical History: Past Medical History:  Diagnosis Date  . COPD (chronic obstructive pulmonary disease) (HCC)   . DVT (deep venous thrombosis) (HCC)     Medications:  Prescriptions Prior to Admission  Medication Sig Dispense Refill Last Dose  . [EXPIRED] acetaminophen (TYLENOL) 500 MG tablet Take 2 tablets (1,000 mg total) by mouth every 8 (eight) hours. 100 tablet 0 08/05/2016 at 0800  . lidocaine (LIDODERM) 5 % Place 1 patch onto the skin daily. Remove & Discard patch within 12 hours or as directed by MD 5 patch 0 08/04/2016 at 2000  . Multiple Vitamins-Minerals (PRESERVISION AREDS 2+MULTI VIT PO) Take 1 tablet by mouth 2 (two) times daily.   08/04/2016 at 2000  . oxyCODONE (ROXICODONE) 5 MG immediate release tablet Take 1 tablet (5 mg total) by mouth every 8 (eight) hours as needed. 12 tablet 0 08/04/2016 at 2000  . senna (SENOKOT) 8.6 MG TABS tablet Take 1 tablet (8.6 mg total) by mouth at bedtime. 30 each 0 08/04/2016 at 2000  . warfarin (COUMADIN) 2.5 MG tablet Take 2.5 mg by mouth. Take 2.5mg  every Sun, Tues, thurs and sat   08/03/2016 at 2000  . warfarin (COUMADIN) 4 MG  tablet Take 4 mg by mouth every Monday, Wednesday, and Friday.    08/04/2016 at 2000   Scheduled:  . budesonide (PULMICORT) nebulizer solution  0.5 mg Nebulization BID  . docusate sodium  100 mg Oral BID  . guaiFENesin  600 mg Oral BID  . ipratropium-albuterol  3 mL Nebulization Q4H  . lidocaine  1 patch Transdermal Q24H  . methylPREDNISolone (SOLU-MEDROL) injection  40 mg Intravenous Q6H  . multivitamin-lutein  1 capsule Oral BID  . oseltamivir  30 mg Oral BID  . polyethylene glycol  17 g Oral Daily  . senna  1 tablet Oral BID  . warfarin  2.5 mg Oral Once per day on Sun Tue Thu Sat  . warfarin  4 mg Oral Q M,W,F  . warfarin  4 mg Oral ONCE-1800  . Warfarin - Physician Dosing Inpatient   Does not apply q1800   Infusions:    Assessment: Pharmacy consulted to dose and monitor warfarin therapy in this 83 year old female.  Home dose of warfarin: warfarin 4 mg MWF; 2.5 mg Tue, Thurs, Sat, Sun  INR                                           Dose 2/1:                 1.93                                              4 mg   Goal of Therapy:  INR 2-3 Monitor platelets by anticoagulation protocol: Yes   Plan:  Will give warfarin 4 mg PO x 1. Will recheck PT/INR with am labs.  Ewald Beg D 08/07/2016,3:28 PM

## 2016-08-07 NOTE — NC FL2 (Signed)
[place] MEDICAID FL2 LEVEL OF CARE SCREENING TOOL     IDENTIFICATION  Patient Name: [name] Birthdate: [birth year] Sex: female Admission Date (Current Location): 08/05/2016  [place] and [place] Number:  Chiropodist and Address:  [facility], [address] [place], [place] [address]      Provider Number: [id number]  Attending Physician Name and Address:  Houston Siren, MD  Relative Name and Phone Number:       Current Level of Care: Hospital Recommended Level of Care: Skilled Nursing Facility Prior Approval Number:    Date Approved/Denied:   PASRR Number: [id number]  Discharge Plan: SNF    Current Diagnoses: Patient Active Problem List   Diagnosis Date Noted  . Acute respiratory failure with hypoxia (HCC) 08/07/2016  . Influenza 08/05/2016  . Dyspnea 08/05/2016  . Constipation 08/05/2016  . Back pain 08/05/2016  . Generalized weakness 08/05/2016  . Intractable back pain 08/01/2016    Orientation RESPIRATION BLADDER Height & Weight     Self, Time, Place, Situation  Normal, O2 (2 liters) Incontinent Weight: 202 lb (91.6 kg) Height:  5\' 9"  (175.3 cm)  BEHAVIORAL SYMPTOMS/MOOD NEUROLOGICAL BOWEL NUTRITION STATUS   (none)   Continent Diet (heart healthy)  AMBULATORY STATUS COMMUNICATION OF NEEDS Skin   Extensive Assist Verbally Normal                       Personal Care Assistance Level of Assistance  Dressing, Feeding, Bathing Bathing Assistance: Maximum assistance Feeding assistance: Limited assistance Dressing Assistance: Maximum assistance     Functional Limitations Info   (no issues)          SPECIAL CARE FACTORS FREQUENCY  PT (By licensed PT)                    Contractures Contractures Info: Not present    Additional Factors Info  Code Status, Allergies, Isolation Precautions Code Status Info: full Allergies Info: asa     Isolation Precautions Info: flu     Current Medications  (08/07/2016):  This is the current hospital active medication list Current Facility-Administered Medications  Medication Dose Route Frequency Provider Last Rate Last Dose  . acetaminophen (TYLENOL) tablet 650 mg  650 mg Oral Q6H PRN Katharina Caper, MD   650 mg at 08/07/16 1027   Or  . acetaminophen (TYLENOL) suppository 650 mg  650 mg Rectal Q6H PRN Katharina Caper, MD      . budesonide (PULMICORT) nebulizer solution 0.5 mg  0.5 mg Nebulization BID Houston Siren, MD   0.5 mg at 08/07/16 0804  . docusate sodium (COLACE) capsule 100 mg  100 mg Oral BID Katharina Caper, MD   100 mg at 08/07/16 0945  . guaiFENesin (MUCINEX) 12 hr tablet 600 mg  600 mg Oral BID Katharina Caper, MD   600 mg at 08/07/16 0944  . ipratropium-albuterol (DUONEB) 0.5-2.5 (3) MG/3ML nebulizer solution 3 mL  3 mL Nebulization Q4H Houston Siren, MD      . lidocaine (LIDODERM) 5 % 1 patch  1 patch Transdermal Q24H Katharina Caper, MD   1 patch at 08/07/16 0946  . methylPREDNISolone sodium succinate (SOLU-MEDROL) 40 mg/mL injection 40 mg  40 mg Intravenous Q6H Houston Siren, MD   40 mg at 08/07/16 1227  . multivitamin-lutein (OCUVITE-LUTEIN) capsule 1 capsule  1 capsule Oral BID Katharina Caper, MD   1 capsule at 08/07/16 0945  . ondansetron (ZOFRAN) tablet 4  mg  4 mg Oral Q6H PRN Katharina Caperima Vaickute, MD       Or  . ondansetron (ZOFRAN) injection 4 mg  4 mg Intravenous Q6H PRN Katharina Caperima Vaickute, MD      . oseltamivir (TAMIFLU) capsule 30 mg  30 mg Oral BID Katharina Caperima Vaickute, MD   30 mg at 08/07/16 0944  . oxyCODONE (Oxy IR/ROXICODONE) immediate release tablet 5 mg  5 mg Oral Q6H PRN Katharina Caperima Vaickute, MD      . polyethylene glycol (MIRALAX / GLYCOLAX) packet 17 g  17 g Oral Daily Katharina Caperima Vaickute, MD   17 g at 08/06/16 1056  . senna (SENOKOT) tablet 8.6 mg  1 tablet Oral BID Katharina Caperima Vaickute, MD   8.6 mg at 08/06/16 1056  . warfarin (COUMADIN) tablet 2.5 mg  2.5 mg Oral Once per day on Sun Tue Thu Sat Katharina Caperima Vaickute, MD   2.5 mg at 08/05/16 2145  . warfarin  (COUMADIN) tablet 4 mg  4 mg Oral Q M,W,F Katharina Caperima Vaickute, MD   4 mg at 08/06/16 1746  . Warfarin - Physician Dosing Inpatient   Does not apply [id number] Katharina Caperima Vaickute, MD         Discharge Medications: Please see discharge summary for a list of discharge medications.  Relevant Imaging Results:  Relevant Lab Results:   Additional Information ss: [id number]  York SpanielMonica Gladies Sofranko, LCSW

## 2016-08-08 ENCOUNTER — Inpatient Hospital Stay: Payer: Medicare Other

## 2016-08-08 LAB — PROTIME-INR
INR: 1.77
PROTHROMBIN TIME: 20.8 s — AB (ref 11.4–15.2)

## 2016-08-08 LAB — GLUCOSE, CAPILLARY: Glucose-Capillary: 184 mg/dL — ABNORMAL HIGH (ref 65–99)

## 2016-08-08 MED ORDER — WARFARIN SODIUM 4 MG PO TABS
6.0000 mg | ORAL_TABLET | Freq: Once | ORAL | Status: AC
  Administered 2016-08-08: 6 mg via ORAL
  Filled 2016-08-08: qty 2

## 2016-08-08 MED ORDER — ALPRAZOLAM 0.25 MG PO TABS
0.2500 mg | ORAL_TABLET | Freq: Three times a day (TID) | ORAL | Status: DC | PRN
  Administered 2016-08-08 – 2016-08-11 (×2): 0.25 mg via ORAL
  Filled 2016-08-08 (×2): qty 1

## 2016-08-08 MED ORDER — METHYLPREDNISOLONE SODIUM SUCC 40 MG IJ SOLR
40.0000 mg | Freq: Two times a day (BID) | INTRAMUSCULAR | Status: DC
  Administered 2016-08-08 – 2016-08-10 (×4): 40 mg via INTRAVENOUS
  Filled 2016-08-08 (×4): qty 1

## 2016-08-08 NOTE — Care Management Important Message (Signed)
Important Message  Patient Details  Name: [name] MRN: [id number] Date of Birth: 10/03/1929   Medicare Important Message Given:  Yes    Chapman FitchBOWEN, Kyriakos Babler T, RN 08/08/2016, 5:24 PM

## 2016-08-08 NOTE — Progress Notes (Signed)
ANTICOAGULATION CONSULT NOTE - Initial Consult  Pharmacy Consult for Warfarin  Indication: DVT   Allergies  Allergen Reactions  . Aspirin Nausea Only    Abdominal pain    Patient Measurements: Height: 5\' 9"  (175.3 cm) Weight: 202 lb (91.6 kg) IBW/kg (Calculated) : 66.2 Heparin Dosing Weight:   Vital Signs: Temp: 97.8 F (36.6 C) (02/02 0742) Temp Source: Oral (02/02 0742) BP: 169/56 (02/02 0756) Pulse Rate: 66 (02/02 0742)  Labs:  Recent Labs  08/05/16 1154  08/06/16 0606 08/07/16 0506 08/08/16 0454  HGB 12.0  --  11.9*  --   --   HCT 34.4*  --  35.2  --   --   PLT 177  --  166  --   --   LABPROT  --   < > 22.3* 21.6* 20.8*  INR  --   < > 1.93 1.85 1.77  CREATININE 1.42*  --  1.37*  --   --   TROPONINI <0.03  --   --   --   --   < > = values in this interval not displayed.  Estimated Creatinine Clearance: 36.9 mL/min (by C-G formula based on SCr of 1.37 mg/dL (H)).   Medical History: Past Medical History:  Diagnosis Date  . COPD (chronic obstructive pulmonary disease) (HCC)   . DVT (deep venous thrombosis) (HCC)     Medications:  Prescriptions Prior to Admission  Medication Sig Dispense Refill Last Dose  . [EXPIRED] acetaminophen (TYLENOL) 500 MG tablet Take 2 tablets (1,000 mg total) by mouth every 8 (eight) hours. 100 tablet 0 08/05/2016 at 0800  . lidocaine (LIDODERM) 5 % Place 1 patch onto the skin daily. Remove & Discard patch within 12 hours or as directed by MD 5 patch 0 08/04/2016 at 2000  . Multiple Vitamins-Minerals (PRESERVISION AREDS 2+MULTI VIT PO) Take 1 tablet by mouth 2 (two) times daily.   08/04/2016 at 2000  . oxyCODONE (ROXICODONE) 5 MG immediate release tablet Take 1 tablet (5 mg total) by mouth every 8 (eight) hours as needed. 12 tablet 0 08/04/2016 at 2000  . senna (SENOKOT) 8.6 MG TABS tablet Take 1 tablet (8.6 mg total) by mouth at bedtime. 30 each 0 08/04/2016 at 2000  . warfarin (COUMADIN) 2.5 MG tablet Take 2.5 mg by mouth. Take 2.5mg   every Sun, Tues, thurs and sat   08/03/2016 at 2000  . warfarin (COUMADIN) 4 MG tablet Take 4 mg by mouth every Monday, Wednesday, and Friday.    08/04/2016 at 2000   Scheduled:  . budesonide (PULMICORT) nebulizer solution  0.5 mg Nebulization BID  . docusate sodium  100 mg Oral BID  . guaiFENesin  600 mg Oral BID  . ipratropium-albuterol  3 mL Nebulization Q4H  . lidocaine  1 patch Transdermal Q24H  . methylPREDNISolone (SOLU-MEDROL) injection  40 mg Intravenous Q6H  . multivitamin-lutein  1 capsule Oral BID  . oseltamivir  30 mg Oral BID  . polyethylene glycol  17 g Oral Daily  . senna  1 tablet Oral BID  . Warfarin - Physician Dosing Inpatient   Does not apply q1800   Infusions:    Assessment: Pharmacy consulted to dose and monitor warfarin therapy in this 83 year old female.  Home dose of warfarin: warfarin 4 mg MWF; 2.5 mg Tue, Thurs, Sat, Sun  INR                                           Dose 2/1:                 1.93                                              4 mg  2/2                  1.77                                              6 mg   Goal of Therapy:  INR 2-3 Monitor platelets by anticoagulation protocol: Yes   Plan:  Will give warfarin 6 mg PO x 1. Will recheck PT/INR with am labs.  Maynor Mwangi D 08/08/2016,9:20 AM

## 2016-08-08 NOTE — Progress Notes (Signed)
PT Attempt Note  Patient Details Name: Roe Coombsrma J Kushner MRN: [id number] DOB: 05-Oct-1929   Cancelled Treatment:    Reason Eval/Treat Not Completed: Other (comment). Attempted to work with patient twice. First attempt pt is in the middle of a breathing treatment. Returned to see patient following breathing treatment and when getting ready to start exercises she has BM in bed and needs to be cleaned. Pt takes extended time to be cleaned. Will attempt treatment at later date/time as pt is available.  Sharalyn InkJason D Brance Dartt PT, DPT   Darria Corvera 08/08/2016, 11:53 AM

## 2016-08-08 NOTE — Progress Notes (Signed)
[employer] - [place] at [facility]   PATIENT NAME: [name]    MR#:  [id number]  DATE OF BIRTH:  Dec 20, 1929  SUBJECTIVE:   Patient here due to acute respiratory failure with hypoxia secondary to the flu. Still having some back pain but wheezing and bronchospasm has improved.  Still coughing but improved.   REVIEW OF SYSTEMS:    Review of Systems  Constitutional: Negative for chills and fever.  HENT: Negative for congestion and tinnitus.   Eyes: Negative for blurred vision and double vision.  Respiratory: Positive for cough, shortness of breath and wheezing.   Cardiovascular: Negative for chest pain, orthopnea and PND.  Gastrointestinal: Negative for abdominal pain, diarrhea, nausea and vomiting.  Genitourinary: Negative for dysuria and hematuria.  Musculoskeletal: Positive for back pain.  Neurological: Positive for weakness. Negative for dizziness, sensory change and focal weakness.  All other systems reviewed and are negative.   Nutrition: Heart Healthy Tolerating Diet: Yes Tolerating PT:  Eval noted.    DRUG ALLERGIES:   Allergies  Allergen Reactions  . Aspirin Nausea Only    Abdominal pain    VITALS:  Blood pressure (!) 169/56, pulse 66, temperature 97.8 F (36.6 C), temperature source Oral, resp. rate 20, height 5\' 9"  (1.753 m), weight 91.6 kg (202 lb), SpO2 94 %.  PHYSICAL EXAMINATION:   Physical Exam  GENERAL:  83 y.o.-year-old patient lying in the bed in NAD.  EYES: Pupils equal, round, reactive to light and accommodation. No scleral icterus. Extraocular muscles intact.  HEENT: Head atraumatic, normocephalic. Oropharynx and nasopharynx clear.  NECK:  Supple, no jugular venous distention. No thyroid enlargement, no tenderness.  LUNGS: Good a/E b/l, end-exp. Wheezing b/l, no rales, rhonchi. No use of accessory muscles of respiration.  CARDIOVASCULAR: S1, S2 normal. No murmurs, rubs, or gallops.  ABDOMEN: Soft, nontender, nondistended. Bowel  sounds present. No organomegaly or mass.  EXTREMITIES: No cyanosis, clubbing or edema b/l.    NEUROLOGIC: Cranial nerves II through XII are intact. No focal Motor or sensory deficits b/l. Globally weak   PSYCHIATRIC: The patient is alert and oriented x 3.  SKIN: No obvious rash, lesion, or ulcer.    LABORATORY PANEL:   CBC  Recent Labs Lab 08/06/16 0606  WBC 5.7  HGB 11.9*  HCT 35.2  PLT 166   ------------------------------------------------------------------------------------------------------------------  Chemistries   Recent Labs Lab 08/05/16 1154 08/06/16 0606  NA 138 139  K 4.1 4.2  CL 104 104  CO2 24 26  GLUCOSE 111* 118*  BUN 33* 33*  CREATININE 1.42* 1.37*  CALCIUM 8.1* 8.1*  AST 44*  --   ALT 16  --   ALKPHOS 52  --   BILITOT 0.9  --    ------------------------------------------------------------------------------------------------------------------  Cardiac Enzymes  Recent Labs Lab 08/05/16 1154  TROPONINI <0.03   ------------------------------------------------------------------------------------------------------------------  RADIOLOGY:  Dg Chest 1 View  Result Date: 08/08/2016 CLINICAL DATA:  Shortness of breath. EXAM: CHEST 1 VIEW COMPARISON:  08/05/2016. FINDINGS: Mediastinum and hilar structures are normal. Mild right base subsegmental atelectasis. Heart size normal. No pleural effusion or pneumothorax. Total left shoulder replacement. IMPRESSION: Mild right base subsegmental atelectasis. Electronically Signed   By: Maisie Fushomas  Register   On: 08/08/2016 07:28     ASSESSMENT AND PLAN:   83 year old female with past medical history of COPD, previous history of DVT who presents to the hospital due to shortness of breath cough and weakness.  1. Acute respiratory failure with hypoxia-secondary to the flu. -Wheezing and bronchospasm  significantly improved since yesterday. We'll continue IV steroids but taper, continue Pulmicort nebs,  scheduled  DuoNeb's. -Continue O2 supplementation and supportive care.  2. Flu-patient is positive for influenza A -Continue droplet precautions, continue Tamiflu. - cont. Treatment as mentioned above  3. Acute bronchitis - due to flu.  Wheeizng, bronchospasm has improved.  - cont.  IV steroids but will taper, cont. duonebs scheduled, Pulmicort nebs.   4. Hx of DVT - cont. Coumadin.   - pharmacy to dose. INR 1.7 today.   5. Back Pain - CT Lumbar spine, X-ray lumbar spine showing no acute pathology.  - PRN oxy for pain.   PT recommending SNF and Social Work aware.  Possible d/c over this weekend.   All the records are reviewed and case discussed with Care Management/Social Worker. Management plans discussed with the patient, family and they are in agreement.  CODE STATUS: Full  DVT Prophylaxis: Coumadin  TOTAL TIME TAKING CARE OF THIS PATIENT: 30 minutes.   POSSIBLE D/C IN 1-2 DAYS, DEPENDING ON CLINICAL CONDITION.   Houston Siren M.D on 08/08/2016 at 1:30 PM  Between 7am to 6pm - Pager - [phone]  After 6pm go to [email or web address] - Social research officer, government  [employer]  [facility]  Office  [phone]  CC: Primary care physician; Etheleen Nicks, NP

## 2016-08-08 NOTE — Clinical Social Work Note (Signed)
CSW extended bed offers to Mr. [name] and he has chosen [name]. CSW has notified [name].  [name] MSW,LCSW [phone]

## 2016-08-09 LAB — PROTIME-INR
INR: 2.11
Prothrombin Time: 24 seconds — ABNORMAL HIGH (ref 11.4–15.2)

## 2016-08-09 LAB — GLUCOSE, CAPILLARY: Glucose-Capillary: 152 mg/dL — ABNORMAL HIGH (ref 65–99)

## 2016-08-09 MED ORDER — WARFARIN SODIUM 5 MG PO TABS
5.0000 mg | ORAL_TABLET | Freq: Once | ORAL | Status: AC
  Administered 2016-08-09: 5 mg via ORAL
  Filled 2016-08-09: qty 1

## 2016-08-09 MED ORDER — WARFARIN - PHARMACIST DOSING INPATIENT
Freq: Every day | Status: DC
  Administered 2016-08-09 – 2016-08-10 (×2)

## 2016-08-09 NOTE — Progress Notes (Signed)
Physical Therapy Treatment Patient Details Name: [name] MRN: [id number] DOB: [birth year] Today's Date: 08/09/2016    History of Present Illness Pt admitted for influenza. Pt with recent admission for LBP following fall onto R hip. Pt now with complaints of SOB, cough and fever. 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    PT Comments    Participated in exercises as described below.  Pt fatigued with self initiated rest breaks.  Offered and encouraged EOB/out of bed to recliner but she declined due to fatigue "Not right now".  Pt voiced she is aware that she is unable to manage at home with help of her husband upon discharge and is thankful to be able to discharge to rehab in order to improve her mobility.     Follow Up Recommendations  SNF     Equipment Recommendations       Recommendations for Other Services       Precautions / Restrictions Precautions Precautions: Fall;Back Precaution Booklet Issued: No Restrictions Weight Bearing Restrictions: No    Mobility  Bed Mobility                  Transfers                    Ambulation/Gait                 Stairs            Wheelchair Mobility    Modified Rankin (Stroke Patients Only)       Balance                                    Cognition Arousal/Alertness: Awake/alert Behavior During Therapy: WFL for tasks assessed/performed Overall Cognitive Status: Within Functional Limits for tasks assessed                      Exercises Other Exercises Other Exercises: supine ther-ex performed including B ankle pumps, quad sets, SLRs, hip abd/add, heel slides and hip add squeezes. All ther-ex performed x 20 reps with cga.     General Comments        Pertinent Vitals/Pain Pain Score: 5  Pain Location: stated pain patch on back is helping Pain Descriptors / Indicators: Sore;Numbness    Home Living                       Prior Function            PT Goals (current goals can now be found in the care plan section)      Frequency    Min 2X/week      PT Plan      Co-evaluation             End of Session   Activity Tolerance: Patient limited by pain;Patient limited by fatigue Patient left: in bed;with bed alarm set;with family/visitor present     Time: [id number] PT Time Calculation (min) (ACUTE ONLY): 9 min  Charges:  $Therapeutic Exercise: 8-22 mins                    G Codes:      Danielle DessSarah Jameah Rouser 08/09/2016, 10:55 AM

## 2016-08-09 NOTE — Progress Notes (Signed)
[employer] [facility] [place] at [facility]   PATIENT NAME: [name]    MR#:  [id number]  DATE OF BIRTH:  Nov 26, 1929  SUBJECTIVE:   Patient here due to acute respiratory failure with hypoxia secondary to the flu.   Patient is still having cough, wheezing.  REVIEW OF SYSTEMS:    Review of Systems  Constitutional: Negative for chills and fever.  HENT: Negative for congestion and tinnitus.   Eyes: Negative for blurred vision and double vision.  Respiratory: Positive for cough, shortness of breath and wheezing.   Cardiovascular: Negative for chest pain, orthopnea and PND.  Gastrointestinal: Negative for abdominal pain, diarrhea, nausea and vomiting.  Genitourinary: Negative for dysuria and hematuria.  Musculoskeletal: Negative for back pain.  Neurological: Negative for dizziness, sensory change, focal weakness and weakness.  All other systems reviewed and are negative.   Nutrition: Heart Healthy Tolerating Diet: Yes Tolerating PT:  Eval noted.    DRUG ALLERGIES:   Allergies  Allergen Reactions  . Aspirin Nausea Only    Abdominal pain    VITALS:  Blood pressure (!) 153/50, pulse 66, temperature 97.5 F (36.4 C), temperature source Oral, resp. rate 18, height 5\' 9"  (1.753 m), weight 91.6 kg (202 lb), SpO2 94 %.  PHYSICAL EXAMINATION:   Physical Exam  GENERAL:  83 y.o.-year-old patient lying in the bed in NAD.  EYES: Pupils equal, round, reactive to light and accommodation. No scleral icterus. Extraocular muscles intact.  HEENT: Head atraumatic, normocephalic. Oropharynx and nasopharynx clear.  NECK:  Supple, no jugular venous distention. No thyroid enlargement, no tenderness.  LUNGS: Good a/E b/l, end-exp. Wheezing b/l, no rales, rhonchi. No use of accessory muscles of respiration.  CARDIOVASCULAR: S1, S2 normal. No murmurs, rubs, or gallops.  ABDOMEN: Soft, nontender, nondistended. Bowel sounds present. No organomegaly or mass.  EXTREMITIES: No cyanosis,  clubbing or edema b/l.    NEUROLOGIC: Cranial nerves II through XII are intact. No focal Motor or sensory deficits b/l. Globally weak   PSYCHIATRIC: The patient is alert and oriented x 3.  SKIN: No obvious rash, lesion, or ulcer.    LABORATORY PANEL:   CBC  Recent Labs Lab 08/06/16 0606  WBC 5.7  HGB 11.9*  HCT 35.2  PLT 166   ------------------------------------------------------------------------------------------------------------------  Chemistries   Recent Labs Lab 08/05/16 1154 08/06/16 0606  NA 138 139  K 4.1 4.2  CL 104 104  CO2 24 26  GLUCOSE 111* 118*  BUN 33* 33*  CREATININE 1.42* 1.37*  CALCIUM 8.1* 8.1*  AST 44*  --   ALT 16  --   ALKPHOS 52  --   BILITOT 0.9  --    ------------------------------------------------------------------------------------------------------------------  Cardiac Enzymes  Recent Labs Lab 08/05/16 1154  TROPONINI <0.03   ------------------------------------------------------------------------------------------------------------------  RADIOLOGY:  Dg Chest 1 View  Result Date: 08/08/2016 CLINICAL DATA:  Shortness of breath. EXAM: CHEST 1 VIEW COMPARISON:  08/05/2016. FINDINGS: Mediastinum and hilar structures are normal. Mild right base subsegmental atelectasis. Heart size normal. No pleural effusion or pneumothorax. Total left shoulder replacement. IMPRESSION: Mild right base subsegmental atelectasis. Electronically Signed   By: Maisie Fus  Register   On: 08/08/2016 07:28     ASSESSMENT AND PLAN:   83 year old female with past medical history of COPD, previous history of DVT who presents to the hospital due to shortness of breath cough and weakness.  1. Acute respiratory failure with hypoxia-secondary to the flu. Maryclare Labrador continue IV steroids ,still wheezing. continue Pulmicort nebs,  scheduled DuoNeb's. -Continue  O2 supplementation and supportive care.  2. Flu-patient is positive for influenza A -Continue droplet  precautions, continue Tamiflu.  o 3. Acute bronchitis - due to flu.  Wheeizng, bronchospasm has improved.  - cont.  IV steroids but will taper, cont. duonebs scheduled, Pulmicort nebs.   4. Hx of DVT - cont. Coumadin.   - pharmacy to dose. INR 1.7 today.   5. Back Pain - CT Lumbar spine, X-ray lumbar spine showing no acute pathology.  - PRN oxy for pain.   PT recommending SNF and Social Work aware.  likely d/c on Monday ,still wheezing and hypoxic,  All the records are reviewed and case discussed with Care Management/Social Worker. Management plans discussed with the patient, family and they are in agreement.  CODE STATUS: Full  DVT Prophylaxis: Coumadin  TOTAL TIME TAKING CARE OF THIS PATIENT: 30 minutes.   POSSIBLE D/C IN 1-2 DAYS, DEPENDING ON CLINICAL CONDITION.   Katha HammingKONIDENA,Basem Yannuzzi M.D on 08/09/2016 at 11:21 AM  Between 7am to 6pm - Pager - [phone]  After 6pm go to [email or web address] - Social research officer, governmentpassword EPAS [email or web address]  [employer] [place] [facility]  Office  [phone]  CC: Primary care physician; Etheleen NicksKeri MacDonald, NP

## 2016-08-09 NOTE — Progress Notes (Signed)
ANTICOAGULATION CONSULT NOTE   Pharmacy Consult for Warfarin  Indication: DVT   Allergies  Allergen Reactions  . Aspirin Nausea Only    Abdominal pain    Patient Measurements: Height: 5\' 9"  (175.3 cm) Weight: 202 lb (91.6 kg) IBW/kg (Calculated) : 66.2 Heparin Dosing Weight:   Vital Signs: Temp: 97.5 F (36.4 C) (02/03 0743) Temp Source: Oral (02/03 0743) BP: 153/50 (02/03 0743) Pulse Rate: 66 (02/03 0743)  Labs:  Recent Labs  08/07/16 0506 08/08/16 0454 08/09/16 0510  LABPROT 21.6* 20.8* 24.0*  INR 1.85 1.77 2.11    Estimated Creatinine Clearance: 36.9 mL/min (by C-G formula based on SCr of 1.37 mg/dL (H)).   Medical History: Past Medical History:  Diagnosis Date  . COPD (chronic obstructive pulmonary disease) (HCC)   . DVT (deep venous thrombosis) (HCC)     Medications:  Prescriptions Prior to Admission  Medication Sig Dispense Refill Last Dose  . [EXPIRED] acetaminophen (TYLENOL) 500 MG tablet Take 2 tablets (1,000 mg total) by mouth every 8 (eight) hours. 100 tablet 0 08/05/2016 at 0800  . lidocaine (LIDODERM) 5 % Place 1 patch onto the skin daily. Remove & Discard patch within 12 hours or as directed by MD 5 patch 0 08/04/2016 at 2000  . Multiple Vitamins-Minerals (PRESERVISION AREDS 2+MULTI VIT PO) Take 1 tablet by mouth 2 (two) times daily.   08/04/2016 at 2000  . oxyCODONE (ROXICODONE) 5 MG immediate release tablet Take 1 tablet (5 mg total) by mouth every 8 (eight) hours as needed. 12 tablet 0 08/04/2016 at 2000  . senna (SENOKOT) 8.6 MG TABS tablet Take 1 tablet (8.6 mg total) by mouth at bedtime. 30 each 0 08/04/2016 at 2000  . warfarin (COUMADIN) 2.5 MG tablet Take 2.5 mg by mouth. Take 2.5mg  every Sun, Tues, thurs and sat   08/03/2016 at 2000  . warfarin (COUMADIN) 4 MG tablet Take 4 mg by mouth every Monday, Wednesday, and Friday.    08/04/2016 at 2000   Scheduled:  . budesonide (PULMICORT) nebulizer solution  0.5 mg Nebulization BID  . docusate sodium   100 mg Oral BID  . guaiFENesin  600 mg Oral BID  . ipratropium-albuterol  3 mL Nebulization Q4H  . lidocaine  1 patch Transdermal Q24H  . methylPREDNISolone (SOLU-MEDROL) injection  40 mg Intravenous Q12H  . multivitamin-lutein  1 capsule Oral BID  . oseltamivir  30 mg Oral BID  . polyethylene glycol  17 g Oral Daily  . senna  1 tablet Oral BID   Infusions:    Assessment: Pharmacy consulted to dose and monitor warfarin therapy in this 83 year old female.  Home dose of warfarin: warfarin 4 mg MWF; 2.5 mg Tue, Thurs, Sat, Sun                         INR                                           Dose 2/1:                 1.93                                              4 mg  2/2                  1.77                                              6 mg  2/3:  2.11        6 mg    Goal of Therapy:  INR 2-3 Monitor platelets by anticoagulation protocol: Yes   Plan:  Will give warfarin 5 mg PO x 1. Will recheck PT/INR with am labs.  Patient on Tamiflu.  Noely Kuhnle A 08/09/2016,12:44 PM

## 2016-08-10 ENCOUNTER — Inpatient Hospital Stay: Payer: Medicare Other

## 2016-08-10 LAB — PROTIME-INR
INR: 2.55
Prothrombin Time: 27.9 seconds — ABNORMAL HIGH (ref 11.4–15.2)

## 2016-08-10 LAB — CBC
HEMATOCRIT: 34.6 % — AB (ref 35.0–47.0)
HEMOGLOBIN: 11.6 g/dL — AB (ref 12.0–16.0)
MCH: 30.2 pg (ref 26.0–34.0)
MCHC: 33.6 g/dL (ref 32.0–36.0)
MCV: 89.9 fL (ref 80.0–100.0)
Platelets: 253 10*3/uL (ref 150–440)
RBC: 3.85 MIL/uL (ref 3.80–5.20)
RDW: 13.2 % (ref 11.5–14.5)
WBC: 13.3 10*3/uL — AB (ref 3.6–11.0)

## 2016-08-10 LAB — CULTURE, BLOOD (ROUTINE X 2)
Culture: NO GROWTH
Culture: NO GROWTH

## 2016-08-10 LAB — GLUCOSE, CAPILLARY: Glucose-Capillary: 110 mg/dL — ABNORMAL HIGH (ref 65–99)

## 2016-08-10 MED ORDER — WARFARIN SODIUM 4 MG PO TABS
4.0000 mg | ORAL_TABLET | Freq: Every day | ORAL | Status: DC
  Administered 2016-08-10: 4 mg via ORAL
  Filled 2016-08-10: qty 1

## 2016-08-10 MED ORDER — METHYLPREDNISOLONE SODIUM SUCC 125 MG IJ SOLR
60.0000 mg | Freq: Four times a day (QID) | INTRAMUSCULAR | Status: DC
  Administered 2016-08-10 – 2016-08-11 (×4): 60 mg via INTRAVENOUS
  Filled 2016-08-10 (×4): qty 2

## 2016-08-10 NOTE — Progress Notes (Signed)
[facility] - [place] at [facility]   PATIENT NAME: [name]    MR#:  [id number]  DATE OF BIRTH:  [birth year]  SUBJECTIVE:   Patient here due to acute respiratory failure with hypoxia secondary to the flu.   Patient is still having cough, wheezing.Patient   Is  not feeling better today.  REVIEW OF SYSTEMS:    Review of Systems  Constitutional: Negative for chills and fever.  HENT: Negative for congestion and tinnitus.   Eyes: Negative for blurred vision and double vision.  Respiratory: Positive for cough, shortness of breath and wheezing.   Cardiovascular: Negative for chest pain, orthopnea and PND.  Gastrointestinal: Negative for abdominal pain, diarrhea, nausea and vomiting.  Genitourinary: Negative for dysuria and hematuria.  Musculoskeletal: Negative for back pain.  Neurological: Negative for dizziness, sensory change, focal weakness and weakness.  All other systems reviewed and are negative.   Nutrition: Heart Healthy Tolerating Diet: Yes Tolerating PT:  Eval noted.    DRUG ALLERGIES:   Allergies  Allergen Reactions  . Aspirin Nausea Only    Abdominal pain    VITALS:  Blood pressure (!) 156/53, pulse 72, temperature 97.7 F (36.5 C), temperature source Oral, resp. rate 16, height 5\' 9"  (1.753 m), weight 91.6 kg (202 lb), SpO2 98 %.  PHYSICAL EXAMINATION:   Physical Exam  GENERAL:  83 y.o.-year-old patient lying in the bed in NAD.  EYES: Pupils equal, round, reactive to light and accommodation. No scleral icterus. Extraocular muscles intact.  HEENT: Head atraumatic, normocephalic. Oropharynx and nasopharynx clear.  NECK:  Supple, no jugular venous distention. No thyroid enlargement, no tenderness.  LUNGS: Good a/E b/l, end-exp. Wheezing b/l, no rales, rhonchi. No use of accessory muscles of respiration.  CARDIOVASCULAR: S1, S2 normal. No murmurs, rubs, or gallops.  ABDOMEN: Soft, nontender, nondistended. Bowel sounds present. No organomegaly  or mass.  EXTREMITIES: No cyanosis, clubbing or edema b/l.    NEUROLOGIC: Cranial nerves II through XII are intact. No focal Motor or sensory deficits b/l. Globally weak   PSYCHIATRIC: The patient is alert and oriented x 3.  SKIN: No obvious rash, lesion, or ulcer.    LABORATORY PANEL:   CBC  Recent Labs Lab 08/10/16 0640  WBC 13.3*  HGB 11.6*  HCT 34.6*  PLT 253   ------------------------------------------------------------------------------------------------------------------  Chemistries   Recent Labs Lab 08/05/16 1154 08/06/16 0606  NA 138 139  K 4.1 4.2  CL 104 104  CO2 24 26  GLUCOSE 111* 118*  BUN 33* 33*  CREATININE 1.42* 1.37*  CALCIUM 8.1* 8.1*  AST 44*  --   ALT 16  --   ALKPHOS 52  --   BILITOT 0.9  --    ------------------------------------------------------------------------------------------------------------------  Cardiac Enzymes  Recent Labs Lab 08/05/16 1154  TROPONINI <0.03   ------------------------------------------------------------------------------------------------------------------  RADIOLOGY:  No results found.   ASSESSMENT AND PLAN:   83 year old female with past medical history of COPD, previous history of DVT who presents to the hospital due to shortness of breath cough and weakness.  1. Acute respiratory failure with hypoxia-secondary to the flu. Maryclare Labrador continue IV steroids ,still wheezing.increase iv steroids,rpt chest xray today. continue Pulmicort nebs,  scheduled DuoNeb's. -Continue O2 supplementation and supportive care.  2. Flu-patient is positive for influenza A -Continue droplet precautions, continue Tamiflu.  o 3. Acute bronchitis - due to flu.  Wheeizng, bronchospasm has improved.  - cont.  IV steroids but will taper, cont. duonebs scheduled, Pulmicort nebs.   4. Hx  of DVT - cont. Coumadin.   - pharmacy to dose. INR 1.7 today.   5. Back Pain - CT Lumbar spine, X-ray lumbar spine showing no acute  pathology.  - PRN oxy for pain.   PT recommending SNF and Social Work aware.  likely d/c on Monday ,still wheezing and hypoxic,  All the records are reviewed and case discussed with Care Management/Social Worker. Management plans discussed with the patient, family and they are in agreement.  CODE STATUS: Full  DVT Prophylaxis: Coumadin  TOTAL TIME TAKING CARE OF THIS PATIENT: 30 minutes.   POSSIBLE D/C IN 1-2 DAYS, DEPENDING ON CLINICAL CONDITION.   Katha HammingKONIDENA,Robin Petrakis M.D on 08/10/2016 at 12:17 PM  Between 7am to 6pm - Pager - [phone]  After 6pm go to [email or web address] - Social research officer, governmentpassword EPAS [facility]  [employer] [place] [facility]  Office  [phone]  CC: Primary care physician; Etheleen NicksKeri MacDonald, NP

## 2016-08-10 NOTE — Progress Notes (Signed)
ANTICOAGULATION CONSULT NOTE   Pharmacy Consult for Warfarin  Indication: DVT   Allergies  Allergen Reactions  . Aspirin Nausea Only    Abdominal pain    Patient Measurements: Height: 5\' 9"  (175.3 cm) Weight: 202 lb (91.6 kg) IBW/kg (Calculated) : 66.2 Heparin Dosing Weight:   Vital Signs: Temp: 97.7 F (36.5 C) (02/04 0818) Temp Source: Oral (02/04 0818) BP: 156/53 (02/04 0818) Pulse Rate: 72 (02/04 0818)  Labs:  Recent Labs  08/08/16 0454 08/09/16 0510 08/10/16 0640  HGB  --   --  11.6*  HCT  --   --  34.6*  PLT  --   --  253  LABPROT 20.8* 24.0* 27.9*  INR 1.77 2.11 2.55    Estimated Creatinine Clearance: 36.9 mL/min (by C-G formula based on SCr of 1.37 mg/dL (H)).   Medical History: Past Medical History:  Diagnosis Date  . COPD (chronic obstructive pulmonary disease) (HCC)   . DVT (deep venous thrombosis) (HCC)     Medications:  Prescriptions Prior to Admission  Medication Sig Dispense Refill Last Dose  . [EXPIRED] acetaminophen (TYLENOL) 500 MG tablet Take 2 tablets (1,000 mg total) by mouth every 8 (eight) hours. 100 tablet 0 08/05/2016 at 0800  . lidocaine (LIDODERM) 5 % Place 1 patch onto the skin daily. Remove & Discard patch within 12 hours or as directed by MD 5 patch 0 08/04/2016 at 2000  . Multiple Vitamins-Minerals (PRESERVISION AREDS 2+MULTI VIT PO) Take 1 tablet by mouth 2 (two) times daily.   08/04/2016 at 2000  . oxyCODONE (ROXICODONE) 5 MG immediate release tablet Take 1 tablet (5 mg total) by mouth every 8 (eight) hours as needed. 12 tablet 0 08/04/2016 at 2000  . senna (SENOKOT) 8.6 MG TABS tablet Take 1 tablet (8.6 mg total) by mouth at bedtime. 30 each 0 08/04/2016 at 2000  . warfarin (COUMADIN) 2.5 MG tablet Take 2.5 mg by mouth. Take 2.5mg  every Sun, Tues, thurs and sat   08/03/2016 at 2000  . warfarin (COUMADIN) 4 MG tablet Take 4 mg by mouth every Monday, Wednesday, and Friday.    08/04/2016 at 2000   Scheduled:  . budesonide (PULMICORT)  nebulizer solution  0.5 mg Nebulization BID  . docusate sodium  100 mg Oral BID  . guaiFENesin  600 mg Oral BID  . ipratropium-albuterol  3 mL Nebulization Q4H  . lidocaine  1 patch Transdermal Q24H  . methylPREDNISolone (SOLU-MEDROL) injection  40 mg Intravenous Q12H  . multivitamin-lutein  1 capsule Oral BID  . polyethylene glycol  17 g Oral Daily  . senna  1 tablet Oral BID  . Warfarin - Pharmacist Dosing Inpatient   Does not apply q1800   Infusions:    Assessment: Pharmacy consulted to dose and monitor warfarin therapy for DVT in this 83 year old female.  Home dose of warfarin: warfarin 4 mg MWF; 2.5 mg Tue, Thurs, Sat, Sun                         INR                                           Dose 2/1:                 1.93  4 mg  2/2                  1.77                                              6 mg  2/3:  2.11        5 mg 2/4:  2.55    Goal of Therapy:  INR 2-3 Monitor platelets by anticoagulation protocol: Yes   Plan:  Will order warfarin 4 mg po daily. Will recheck PT/INR with am labs.  Patient on Tamiflu.  Kendyll Huettner A 08/10/2016,10:54 AM

## 2016-08-11 LAB — BASIC METABOLIC PANEL
Anion gap: 9 (ref 5–15)
BUN: 38 mg/dL — AB (ref 6–20)
CHLORIDE: 101 mmol/L (ref 101–111)
CO2: 28 mmol/L (ref 22–32)
CREATININE: 1.05 mg/dL — AB (ref 0.44–1.00)
Calcium: 9.1 mg/dL (ref 8.9–10.3)
GFR calc Af Amer: 55 mL/min — ABNORMAL LOW (ref >60)
GFR calc non Af Amer: 47 mL/min — ABNORMAL LOW (ref >60)
Glucose, Bld: 218 mg/dL — ABNORMAL HIGH (ref 65–99)
POTASSIUM: 4.5 mmol/L (ref 3.5–5.1)
Sodium: 138 mmol/L (ref 135–145)

## 2016-08-11 LAB — PROTIME-INR
INR: 2.97
Prothrombin Time: 31.5 seconds — ABNORMAL HIGH (ref 11.4–15.2)

## 2016-08-11 LAB — GLUCOSE, CAPILLARY: Glucose-Capillary: 173 mg/dL — ABNORMAL HIGH (ref 65–99)

## 2016-08-11 MED ORDER — WARFARIN SODIUM 4 MG PO TABS: 4.0000 mg | ORAL_TABLET | Freq: Every day | ORAL | 0 refills | Status: DC

## 2016-08-11 MED ORDER — GUAIFENESIN ER 600 MG PO TB12: 600.0000 mg | ORAL_TABLET | Freq: Two times a day (BID) | ORAL | 0 refills | Status: AC

## 2016-08-11 MED ORDER — BUDESONIDE 0.5 MG/2ML IN SUSP: 0.5000 mg | Freq: Two times a day (BID) | RESPIRATORY_TRACT | 12 refills | Status: DC

## 2016-08-11 MED ORDER — PREDNISONE 10 MG PO TABS: 10.0000 mg | ORAL_TABLET | Freq: Every day | ORAL | 0 refills | Status: DC

## 2016-08-11 MED ORDER — ALPRAZOLAM 0.25 MG PO TABS: 0.2500 mg | ORAL_TABLET | Freq: Three times a day (TID) | ORAL | 0 refills | Status: AC | PRN

## 2016-08-11 NOTE — Progress Notes (Addendum)
Pt A and O x 3. VSS. Pt tolerating diet well. No complaints of pain or nausea. IV removed intact, prescriptions given. Pt voiced understanding of discharge instructions with no further questions. Pt discharged via EMS. Report called to [facility].

## 2016-08-11 NOTE — Care Management Important Message (Signed)
Important Message  Patient Details  Name: [name] MRN: [id number] Date of Birth: 1930-05-08   Medicare Important Message Given:  Yes    Chapman FitchBOWEN, Janiqua Friscia T, RN 08/11/2016, 10:14 AM

## 2016-08-11 NOTE — Clinical Social Work Note (Signed)
Patient to discharge today to go to [facility]. [facility]. Nurse to call report. Both patient's husband and son are aware of discharge. Patient to transport via EMS. York SpanielMonica Chayson Charters MSW,LCSW [phone]

## 2016-08-11 NOTE — Discharge Summary (Signed)
[name], is a 83 y.o. female  DOB May 16, 1930  MRN [id number].  Admission date:  08/05/2016  Admitting Physician  Katharina Caper, MD  Discharge Date:  08/11/2016   Primary MD  Etheleen Nicks, NP  Recommendations for primary care physician for things to follow:  Follow up with  pmd in  One week  She  is discharged to [facility]. Admission Diagnosis  Weakness [R53.1] Influenza A [J10.1] Hypoxia [R09.02]   Discharge Diagnosis  Weakness [R53.1] Influenza A [J10.1] Hypoxia [R09.02]    Active Problems:   Influenza   Dyspnea   Constipation   Back pain   Generalized weakness   Acute respiratory failure with hypoxia [facility])      Past Medical History:  Diagnosis Date  . COPD (chronic obstructive pulmonary disease) (HCC)   . DVT (deep venous thrombosis) (HCC)     History reviewed. No pertinent surgical history.     History of present illness and  Hospital Course:     Kindly see H&P for history of present illness and admission details, please review complete Labs, Consult reports and Test reports for all details in brief  HPI  from the history and physical done on the day of admission [name]  is a 83 y.o. female with a known history of Recurrent DVTs, on anticoagulation therapy with Coumadin, former smoker, history of COPD, recent admission to the hospital from the 26th through January 28 for fall and left hip pain, who presents back to the hospital not feeling well, having some fevers, fatigue, weakness, coughing, but not able to produce any phlegm. On arrival to emergency room, she was noted to be moderately hypoxic with O2 sats of 97 on room air, she was dyspneic, labs revealed positivity for influenza A, hospitalist services were contacted for admission. The chest x-ray was unremarkable.   Hospital Course   #1 acute respiratory failure with hypoxia secondary to COPD exacerbation, type A influenza; patient received Tamiflu, IV Solu-Medrol, oxygen, DuoNeb's every 4 hours. Chest x-ray didn't did not show any pneumonia. Patient symptoms improved. Discharging her today with pale steroids tapering course for 8 days, Pulmicort nebs.  #2 history of DVT: Patient is on Coumadin, INR therapeutic. Patient needs INR checked in 2-3 days and adjust the dose of Coumadin.  #3 low back pain chronic: CT lumbar spine, x-ray lumbar spine did not show any acute pathology. Physical therapy recommended skilled nursing. Patient is going to [facility].  #4 acute bronchitis improved with nebulizers, steroids. 5.anxiety patient is on Xanax.     Discharge Condition:stable  Follow UP   Contact information for follow-up providers    Etheleen Nicks, NP. Go on 08/19/2016.   Why:  Tuesday at 1:20pm for hospital follow-up Contact information: [address] [place] [address] [phone]            Contact information for after-discharge care    Destination    [facility] Follow up.   Specialty:  Skilled Nursing Facility Contact information: [address] [place] [address] [phone]                    Discharge Instructions  and  Discharge Medications      Allergies as of 08/11/2016      Reactions   Aspirin Nausea Only   Abdominal pain      Medication List    STOP taking these medications   acetaminophen 500 MG tablet Commonly known as:  TYLENOL   oxyCODONE 5 MG immediate release tablet Commonly known as:  ROXICODONE     TAKE these medications   ALPRAZolam 0.25 MG tablet Commonly known as:  XANAX Take 1 tablet (0.25 mg total) by mouth 3 (three) times daily as needed for anxiety.   budesonide 0.5 MG/2ML nebulizer solution Commonly known as:  PULMICORT Take 2 mLs (0.5 mg total) by nebulization 2 (two) times daily.    guaiFENesin 600 MG 12 hr tablet Commonly known as:  MUCINEX Take 1 tablet (600 mg total) by mouth 2 (two) times daily.   lidocaine 5 % Commonly known as:  LIDODERM Place 1 patch onto the skin daily. Remove & Discard patch within 12 hours or as directed by MD   predniSONE 10 MG tablet Commonly known as:  DELTASONE Take 1 tablet (10 mg total) by mouth daily with breakfast. Take 40 mg daily  For  2 days 30 m daily for 2 days 20 mg daily for 2 days 10 mg daily for 2 days   PRESERVISION AREDS 2+MULTI VIT PO Take 1 tablet by mouth 2 (two) times daily.   senna 8.6 MG Tabs tablet Commonly known as:  SENOKOT Take 1 tablet (8.6 mg total) by mouth at bedtime.   warfarin 4 MG tablet Commonly known as:  COUMADIN Take 1 tablet (4 mg total) by mouth daily at 6 PM. What changed:  medication strength  how much to take  when to take this  additional instructions  Another medication with the same name was removed. Continue taking this medication, and follow the directions you see here.         Diet and Activity recommendation: See Discharge Instructions above   Consults obtained - PT   Major procedures and Radiology Reports - PLEASE review detailed and final reports for all details, in brief -      Dg Chest 1 View  Result Date: 08/10/2016 CLINICAL DATA:  Dyspnea on exertion EXAM: CHEST 1 VIEW COMPARISON:  08/08/2016 chest radiograph. FINDINGS: Partially visualized left total shoulder arthroplasty. Stable cardiomediastinal silhouette with top-normal heart size and aortic atherosclerosis. No pneumothorax. No pleural effusion. Lungs appear clear, with no acute consolidative airspace disease and no pulmonary edema. IMPRESSION: No active cardiopulmonary disease.  Aortic atherosclerosis. Electronically Signed   By: Delbert Phenix M.D.   On: 08/10/2016 13:02   Dg Chest 1 View  Result Date: 08/08/2016 CLINICAL DATA:  Shortness of breath. EXAM: CHEST 1 VIEW COMPARISON:  08/05/2016.  FINDINGS: Mediastinum and hilar structures are normal. Mild right base subsegmental atelectasis. Heart size normal. No pleural effusion or pneumothorax. Total left shoulder replacement. IMPRESSION: Mild right base subsegmental atelectasis. Electronically Signed   By: Maisie Fus  Register   On: 08/08/2016 07:28   Dg Lumbar Spine Complete  Result Date: 08/01/2016 CLINICAL DATA:  Fall. EXAM: LUMBAR SPINE - COMPLETE 4+ VIEW COMPARISON:  12/09/2009 CT 06/14/2007. FINDINGS: Diffuse osteopenia and degenerative change. No acute bony abnormality identified. Aortic atherosclerotic vascular calcification. Aortic ectasia 3.1 cm. Aortic ultrasound should be considered for further evaluation to exclude aneurysm. IMPRESSION: 1. Diffuse osteopenia hand degenerative change. Multiple thoracic and upper lumbar compression fractures. Age undetermined. 2. Aortoiliac atherosclerotic vascular disease. Aortic ectasia 3.1 cm. Aortic ultrasound should be considered for further evaluation to exclude aneurysm . Electronically Signed   By: Maisie Fus  Register   On: 08/01/2016 13:20   Ct Head Wo Contrast  Result Date: 08/01/2016 CLINICAL DATA:  Fall with left hip pain.  Initial encounter. EXAM: CT HEAD WITHOUT  CONTRAST TECHNIQUE: Contiguous axial images were obtained from the base of the skull through the vertex without intravenous contrast. COMPARISON:  04/09/2012 FINDINGS: Brain: No evidence of acute infarction, hemorrhage, hydrocephalus, extra-axial collection or mass lesion/mass effect. Chronic microvascular disease with confluent ischemic gliosis in the periventricular white matter, mild for age. Central volume loss with mild ventriculomegaly. Vascular: Atherosclerotic calcification.  No hyperdense vessel. Skull: Negative for fracture Sinuses/Orbits: Left cataract resection.  No posttraumatic finding. IMPRESSION: No evidence of intracranial injury. Electronically Signed   By: Marnee Spring M.D.   On: 08/01/2016 13:02   Ct Lumbar Spine  Wo Contrast  Result Date: 08/01/2016 CLINICAL DATA:  83 y/o F; age-indeterminate compression deformities of thoracic and lumbar spine. EXAM: CT LUMBAR SPINE WITHOUT CONTRAST TECHNIQUE: Multidetector CT imaging of the lumbar spine was performed without intravenous contrast administration. Multiplanar CT image reconstructions were also generated. COMPARISON:  08/01/2016 lumbar radiographs FINDINGS: Segmentation: 5 lumbar type vertebrae. Alignment: Normal. Vertebrae: Minimal loss of height of T12 vertebral body with chronic appearance. No significant loss of height of the lumbar vertebral bodies. Suspected upper lumbar compression deformities on prior radiographs is probably due projectional artifact from patient positioning. Paraspinal and other soft tissues: Severe calcific atherosclerosis of the abdominal aorta with focal ectasia up to 28 mm. Disc levels: Multilevel discogenic degenerative changes with marginal osteophytes and mild loss of disc space height greatest at L4-5. Facet arthrosis pronounced at L3 through S1. Articulation of the L2 through S1 spinous processes with productive changes compatible with Baastrup's disease. Disc, facet, and ligamentum flavum disease at multiple levels resulting in canal stenosis greatest at L3-4 where it is probably moderate. IMPRESSION: 1. Minimal loss of height of T12 vertebral body with chronic appearance. No significant loss of height of the lumbar vertebral bodies. Suspected upper lumbar compression deformities on prior radiographs is probably due projectional artifact from patient positioning. 2. Moderate lumbar spondylosis with discogenic and facet degenerative changes as well as lower lumbar Baastrup's disease. 3. Aortic atherosclerosis. Ectatic aortic measuring 28 mm. Ectatic abdominal aorta at risk for aneurysm development. Recommend followup by ultrasound in 5 years. This recommendation follows ACR consensus guidelines: White Paper of the ACR Incidental Findings  Committee II on Vascular Findings. J Am Coll Radiol 2013; 10:789-794. Electronically Signed   By: Mitzi Hansen M.D.   On: 08/01/2016 16:18   Dg Chest Port 1 View  Result Date: 08/05/2016 CLINICAL DATA:  Fever and  nonproductive cough. EXAM: PORTABLE CHEST 1 VIEW COMPARISON:  04/11/2012 FINDINGS: Normal heart size. Stable aortic contours when accounting for distortion by rightward rotation. There is no edema, consolidation, effusion, or pneumothorax. Osteopenia and reverse glenohumeral arthroplasty on the left. IMPRESSION: No acute finding. Electronically Signed   By: Marnee Spring M.D.   On: 08/05/2016 12:33   Dg Knee Complete 4 Views Left  Result Date: 08/01/2016 CLINICAL DATA:  Patient fell forward while picking up something, landing on her left hip. Patient c/o left hip pain. Ems denies shortening and rotation. Denies LOC EXAM: LEFT KNEE - COMPLETE 4+ VIEW COMPARISON:  04/11/2012 FINDINGS: No acute fracture. There has been a previous ORIF of a comminuted proximal tibial fracture with a lateral fixation plate and multiple screws. The orthopedic hardware appears well-seated and stable from the prior exam. There is residual fracture deformity, but the fracture has healed. There is moderate medial compartment joint space narrowing with milder patellofemoral and lateral compartment narrowing. Marginal osteophytes project from all 3 compartments. No bone lesions.  Bones are demineralized. No joint  effusion. There are scattered posterior vascular calcifications. IMPRESSION: 1. No acute fracture or dislocation. 2. Status post ORIF of a comminuted proximal tibial fracture. Orthopedic hardware is well-seated with no evidence of loosening. Electronically Signed   By: Amie Portland M.D.   On: 08/01/2016 13:18   Dg Hip Unilat With Pelvis 2-3 Views Left  Result Date: 08/01/2016 CLINICAL DATA:  Patient fell forward while picking up something, landing on her left hip. Patient c/o left hip pain. Ems  denies shortening and rotation. Denies LOC EXAM: DG HIP (WITH OR WITHOUT PELVIS) 2-3V LEFT COMPARISON:  None. FINDINGS: No fracture.  No bone lesion. Hip joints, SI joints and symphysis pubis are normally spaced and aligned. No significant arthropathic change. Bones are diffusely demineralized. Soft tissues are unremarkable. IMPRESSION: No fracture or significant hip joint abnormality. Electronically Signed   By: Amie Portland M.D.   On: 08/01/2016 13:19    Micro Results     Recent Results (from the past 240 hour(s))  Urine culture     Status: None   Collection Time: 08/05/16 11:27 AM  Result Value Ref Range Status   Specimen Description URINE, RANDOM  Final   Special Requests NONE  Final   Culture   Final    NO GROWTH Performed at [facility], [address] [place], [place] [address]    Report Status 08/06/2016 FINAL  Final  Blood Culture (routine x 2)     Status: None   Collection Time: 08/05/16 11:54 AM  Result Value Ref Range Status   Specimen Description BLOOD LEFT WRIST  Final   Special Requests   Final    BOTTLES DRAWN AEROBIC AND ANAEROBIC AER ANA   Culture NO GROWTH 5 DAYS  Final   Report Status 08/10/2016 FINAL  Final  Blood Culture (routine x 2)     Status: None   Collection Time: 08/05/16 11:55 AM  Result Value Ref Range Status   Specimen Description BLOOD RIGHT AC  Final   Special Requests   Final    BOTTLES DRAWN AEROBIC AND ANAEROBIC AER ANA   Culture NO GROWTH 5 DAYS  Final   Report Status 08/10/2016 FINAL  Final       Today   Subjective:   Londell Moh today,Darkness of breath improved. Stable for discharge.  Objective:   Blood pressure (!) 170/70, pulse 67, temperature 97.5 F (36.4 C), temperature source Oral, resp. rate 17, height 5\' 9"  (1.753 m), weight 91.6 kg (202 lb), SpO2 93 %.   Intake/Output Summary (Last 24 hours) at 08/11/16 1010 Last data filed at 08/11/16 0710  Gross per 24 hour  Intake              240 ml   Output                0 ml  Net              240 ml    Exam Awake Alert, Oriented x 3, No new F.N deficits, Normal affect [place].AT,PERRAL Supple Neck,No JVD, No cervical lymphadenopathy appriciated.  Symmetrical Chest wall movement, Good air movement bilaterally, CTAB RRR,No Gallops,Rubs or new Murmurs, No Parasternal Heave +ve B.Sounds, Abd Soft, Non tender, No organomegaly appriciated, No rebound -guarding or rigidity. No Cyanosis, Clubbing or edema, No new Rash or bruise  Data Review   CBC w Diff: Lab Results  Component Value Date   WBC 13.3 (H) 08/10/2016   HGB 11.6 (L)  08/10/2016   HGB 9.5 (L) 04/12/2012   HCT 34.6 (L) 08/10/2016   HCT 27.0 (L) 04/12/2012   PLT 253 08/10/2016   PLT 262 04/12/2012   LYMPHOPCT 19 08/05/2016   LYMPHOPCT 14.9 04/12/2012   MONOPCT 15 08/05/2016   MONOPCT 10.4 04/12/2012   EOSPCT 2 08/05/2016   EOSPCT 5.1 04/12/2012   BASOPCT 1 08/05/2016   BASOPCT 0.6 04/12/2012    CMP: Lab Results  Component Value Date   NA 138 08/11/2016   NA 135 (L) 04/12/2012   K 4.5 08/11/2016   K 4.4 04/12/2012   CL 101 08/11/2016   CL 99 04/12/2012   CO2 28 08/11/2016   CO2 24 04/12/2012   BUN 38 (H) 08/11/2016   BUN 26 (H) 04/12/2012   CREATININE 1.05 (H) 08/11/2016   CREATININE 1.37 (H) 04/12/2012   PROT 6.9 08/05/2016   PROT 7.2 04/11/2012   ALBUMIN 3.5 08/05/2016   ALBUMIN 2.9 (L) 04/11/2012   BILITOT 0.9 08/05/2016   BILITOT 1.1 (H) 04/11/2012   ALKPHOS 52 08/05/2016   ALKPHOS 82 04/11/2012   AST 44 (H) 08/05/2016   AST 25 04/11/2012   ALT 16 08/05/2016   ALT 12 04/11/2012  .   Total Time in preparing paper work, data evaluation and todays exam - 35 minutes  Hilda Wexler M.D on 08/11/2016 at 10:10 AM    Note: This dictation was prepared with Dragon dictation along with smaller phrase technology. Any transcriptional errors that result from this process are unintentional.

## 2016-11-10 ENCOUNTER — Ambulatory Visit (INDEPENDENT_AMBULATORY_CARE_PROVIDER_SITE_OTHER): Payer: Medicare Other | Admitting: Podiatry

## 2016-11-10 DIAGNOSIS — L600 Ingrowing nail: Secondary | ICD-10-CM

## 2016-11-10 DIAGNOSIS — M79676 Pain in unspecified toe(s): Secondary | ICD-10-CM | POA: Diagnosis not present

## 2016-11-10 DIAGNOSIS — B351 Tinea unguium: Secondary | ICD-10-CM

## 2016-11-10 NOTE — Progress Notes (Signed)
Subjective:     Patient ID: [name], female   DOB: [birth year], 83 y.o.   MRN: [id number]  HPI this patient presents the office with chief complaint of painful great toenails on both feet. Patient states that she has been experiencing ingrowing toenails on the first and second toes of the right foot. She says that her caretaker has been working on the nails. She says she is experiencing pain and discomfort walking and wearing her shoes. She denies any history of drainage and presents the office today for an evaluation and treatment of this painful condition   Review of Systems     Objective:   Physical Exam GENERAL APPEARANCE: Alert, conversant. Appropriately groomed. No acute distress.  VASCULAR: Pedal pulses are  palpable at  [facility].  Capillary refill time is immediate to all digits,  Normal temperature gradient.   NEUROLOGIC: sensation is normal to 5.07 monofilament at 5/5 sites bilateral.  Light touch is intact bilateral, Muscle strength normal.  MUSCULOSKELETAL: acceptable muscle strength, tone and stability bilateral.  Mild    DERMATOLOGIC: skin color, texture, and turgor are within normal limits.  No preulcerative lesions or ulcers  are seen, no interdigital maceration noted.  No open lesions present.   No drainage noted.  NAILS  Thick disfigured discolored nails both feet. She also has marked incurvation noted on the great toe and the second toe of the right foot. Due to pincer toenails. No evidence of any redness, swelling or infection      Assessment:     Onychomycosis  B/L    Plan:     IE  Debridement of nails  B/L  RTC 3 months.   [name]   [name]

## 2016-12-05 ENCOUNTER — Observation Stay: Payer: Medicare Other

## 2016-12-05 ENCOUNTER — Emergency Department: Payer: Medicare Other

## 2016-12-05 ENCOUNTER — Inpatient Hospital Stay
Admission: EM | Admit: 2016-12-05 | Discharge: 2016-12-08 | DRG: 199 | Disposition: A | Discharge status: Home / Self Care | Payer: Medicare Other | Attending: Internal Medicine | Admitting: Internal Medicine

## 2016-12-05 ENCOUNTER — Encounter: Payer: Self-pay | Admitting: Emergency Medicine

## 2016-12-05 DIAGNOSIS — Z96612 Presence of left artificial shoulder joint: Secondary | ICD-10-CM | POA: Diagnosis present

## 2016-12-05 DIAGNOSIS — J939 Pneumothorax, unspecified: Secondary | ICD-10-CM

## 2016-12-05 DIAGNOSIS — Z87891 Personal history of nicotine dependence: Secondary | ICD-10-CM

## 2016-12-05 DIAGNOSIS — J9311 Primary spontaneous pneumothorax: Secondary | ICD-10-CM | POA: Diagnosis not present

## 2016-12-05 DIAGNOSIS — N183 Chronic kidney disease, stage 3 (moderate): Secondary | ICD-10-CM | POA: Diagnosis present

## 2016-12-05 DIAGNOSIS — J441 Chronic obstructive pulmonary disease with (acute) exacerbation: Secondary | ICD-10-CM | POA: Diagnosis present

## 2016-12-05 DIAGNOSIS — I129 Hypertensive chronic kidney disease with stage 1 through stage 4 chronic kidney disease, or unspecified chronic kidney disease: Secondary | ICD-10-CM | POA: Diagnosis present

## 2016-12-05 DIAGNOSIS — J9601 Acute respiratory failure with hypoxia: Secondary | ICD-10-CM | POA: Diagnosis present

## 2016-12-05 DIAGNOSIS — J9383 Other pneumothorax: Principal | ICD-10-CM | POA: Diagnosis present

## 2016-12-05 DIAGNOSIS — Z7901 Long term (current) use of anticoagulants: Secondary | ICD-10-CM

## 2016-12-05 DIAGNOSIS — Z79899 Other long term (current) drug therapy: Secondary | ICD-10-CM

## 2016-12-05 DIAGNOSIS — J9600 Acute respiratory failure, unspecified whether with hypoxia or hypercapnia: Secondary | ICD-10-CM | POA: Diagnosis present

## 2016-12-05 DIAGNOSIS — Z09 Encounter for follow-up examination after completed treatment for conditions other than malignant neoplasm: Secondary | ICD-10-CM

## 2016-12-05 DIAGNOSIS — R079 Chest pain, unspecified: Secondary | ICD-10-CM

## 2016-12-05 DIAGNOSIS — Z86718 Personal history of other venous thrombosis and embolism: Secondary | ICD-10-CM

## 2016-12-05 HISTORY — DX: Essential (primary) hypertension: I10

## 2016-12-05 HISTORY — DX: Pneumothorax, unspecified: J93.9

## 2016-12-05 LAB — TROPONIN I
Troponin I: <0.03 ng/mL (ref <0.03)
Troponin I: <0.03 ng/mL (ref <0.03)

## 2016-12-05 LAB — CBC
HCT: 38.6 % (ref 35.0–47.0)
Hemoglobin: 13.1 g/dL (ref 12.0–16.0)
MCH: 29.6 pg (ref 26.0–34.0)
MCHC: 33.9 g/dL (ref 32.0–36.0)
MCV: 87.5 fL (ref 80.0–100.0)
PLATELETS: 254 10*3/uL (ref 150–440)
RBC: 4.41 MIL/uL (ref 3.80–5.20)
RDW: 14.1 % (ref 11.5–14.5)
WBC: 8.6 10*3/uL (ref 3.6–11.0)

## 2016-12-05 LAB — LIPID PANEL
CHOL/HDL RATIO: 5.2 ratio
CHOLESTEROL: 206 mg/dL — AB (ref 0–200)
HDL: 40 mg/dL — ABNORMAL LOW (ref >40)
LDL CALC: 140 mg/dL — AB (ref 0–99)
TRIGLYCERIDES: 131 mg/dL (ref <150)
VLDL: 26 mg/dL (ref 0–40)

## 2016-12-05 LAB — BASIC METABOLIC PANEL
Anion gap: 7 (ref 5–15)
BUN: 25 mg/dL — AB (ref 6–20)
CALCIUM: 9.1 mg/dL (ref 8.9–10.3)
CO2: 29 mmol/L (ref 22–32)
Chloride: 104 mmol/L (ref 101–111)
Creatinine, Ser: 1.31 mg/dL — ABNORMAL HIGH (ref 0.44–1.00)
GFR calc Af Amer: 42 mL/min — ABNORMAL LOW (ref >60)
GFR, EST NON AFRICAN AMERICAN: 36 mL/min — AB (ref >60)
GLUCOSE: 109 mg/dL — AB (ref 65–99)
Potassium: 4.1 mmol/L (ref 3.5–5.1)
SODIUM: 140 mmol/L (ref 135–145)

## 2016-12-05 LAB — BRAIN NATRIURETIC PEPTIDE: B Natriuretic Peptide: 18 pg/mL (ref 0.0–100.0)

## 2016-12-05 LAB — APTT: aPTT: 34 seconds (ref 24–36)

## 2016-12-05 LAB — PROTIME-INR
INR: 1.21
PROTHROMBIN TIME: 15.4 s — AB (ref 11.4–15.2)

## 2016-12-05 MED ORDER — OXYCODONE-ACETAMINOPHEN 5-325 MG PO TABS
1.0000 | ORAL_TABLET | ORAL | Status: DC | PRN
  Administered 2016-12-05 – 2016-12-06 (×2): 1 via ORAL
  Filled 2016-12-05 (×2): qty 1

## 2016-12-05 MED ORDER — GUAIFENESIN ER 600 MG PO TB12
600.0000 mg | ORAL_TABLET | Freq: Two times a day (BID) | ORAL | Status: DC
  Administered 2016-12-05 – 2016-12-08 (×6): 600 mg via ORAL
  Filled 2016-12-05 (×6): qty 1

## 2016-12-05 MED ORDER — MORPHINE SULFATE (PF) 2 MG/ML IV SOLN
2.0000 mg | INTRAVENOUS | Status: DC | PRN
  Administered 2016-12-05 – 2016-12-06 (×3): 2 mg via INTRAVENOUS
  Filled 2016-12-05 (×3): qty 1

## 2016-12-05 MED ORDER — WARFARIN - PHARMACIST DOSING INPATIENT
Freq: Every day | Status: DC
  Filled 2016-12-05 (×6): qty 1

## 2016-12-05 MED ORDER — IOPAMIDOL (ISOVUE-370) INJECTION 76%
75.0000 mL | Freq: Once | INTRAVENOUS | Status: AC | PRN
  Administered 2016-12-05: 60 mL via INTRAVENOUS

## 2016-12-05 MED ORDER — ACETAMINOPHEN 325 MG PO TABS: 650.0000 mg | ORAL_TABLET | Freq: Four times a day (QID) | ORAL | Status: DC | PRN

## 2016-12-05 MED ORDER — SENNA 8.6 MG PO TABS
1.0000 | ORAL_TABLET | Freq: Every day | ORAL | Status: DC
  Administered 2016-12-05 – 2016-12-06 (×2): 8.6 mg via ORAL
  Filled 2016-12-05 (×3): qty 1

## 2016-12-05 MED ORDER — ALPRAZOLAM 0.25 MG PO TABS
0.2500 mg | ORAL_TABLET | Freq: Three times a day (TID) | ORAL | Status: DC | PRN
  Administered 2016-12-06: 0.25 mg via ORAL
  Filled 2016-12-05: qty 1

## 2016-12-05 MED ORDER — HEPARIN SODIUM (PORCINE) 5000 UNIT/ML IJ SOLN
5000.0000 [IU] | Freq: Three times a day (TID) | INTRAMUSCULAR | Status: DC
  Administered 2016-12-05 – 2016-12-08 (×9): 5000 [IU] via SUBCUTANEOUS
  Filled 2016-12-05 (×8): qty 1

## 2016-12-05 MED ORDER — AMLODIPINE BESYLATE 5 MG PO TABS
2.5000 mg | ORAL_TABLET | Freq: Every day | ORAL | Status: DC
  Administered 2016-12-07 – 2016-12-08 (×2): 2.5 mg via ORAL
  Filled 2016-12-05 (×3): qty 1

## 2016-12-05 MED ORDER — WARFARIN SODIUM 7.5 MG PO TABS
7.5000 mg | ORAL_TABLET | Freq: Once | ORAL | Status: AC
  Administered 2016-12-05: 7.5 mg via ORAL
  Filled 2016-12-05: qty 1

## 2016-12-05 MED ORDER — OXYCODONE-ACETAMINOPHEN 5-325 MG PO TABS
1.0000 | ORAL_TABLET | ORAL | Status: DC | PRN
  Administered 2016-12-05: 1 via ORAL
  Filled 2016-12-05: qty 1

## 2016-12-05 MED ORDER — DOCUSATE SODIUM 100 MG PO CAPS: 100.0000 mg | ORAL_CAPSULE | Freq: Two times a day (BID) | ORAL | Status: DC | PRN

## 2016-12-05 MED ORDER — FUROSEMIDE 20 MG PO TABS
20.0000 mg | ORAL_TABLET | Freq: Every day | ORAL | Status: DC
  Administered 2016-12-06 – 2016-12-08 (×3): 20 mg via ORAL
  Filled 2016-12-05 (×3): qty 1

## 2016-12-05 NOTE — Progress Notes (Signed)
ANTICOAGULATION CONSULT NOTE - Initial Consult  Pharmacy Consult for Warfarin Indication: hx of DVT  Allergies  Allergen Reactions  . Nsaids     Pt w/ CKD3  . Aspirin Nausea Only    Abdominal pain    Patient Measurements: Height: 5\' 6"  (167.6 cm) Weight: 200 lb (90.7 kg) IBW/kg (Calculated) : 59.3 Heparin Dosing Weight:    Vital Signs: Temp: 97.8 F (36.6 C) (06/01 0700) Temp Source: Oral (06/01 0700) BP: 126/57 (06/01 1030) Pulse Rate: 73 (06/01 1030)  Labs:  Recent Labs  12/05/16 0703  HGB 13.1  HCT 38.6  PLT 254  APTT 34  LABPROT 15.4*  INR 1.21  CREATININE 1.31*  TROPONINI <0.03    Estimated Creatinine Clearance: 36.3 mL/min (A) (by C-G formula based on SCr of 1.31 mg/dL (H)).   Medical History: Past Medical History:  Diagnosis Date  . COPD (chronic obstructive pulmonary disease) (HCC)   . DVT (deep venous thrombosis) (HCC)   . Hypertension   . Pneumothorax     Medications:  Scheduled:  . warfarin  7.5 mg Oral ONCE-1800  . Warfarin - Pharmacist Dosing Inpatient   Does not apply q1800   Infusions:    Assessment: 83  Yo F admitted with Chest pain. Patient on warfarin 5 mg daily at home for hx of DVT.  6/1  INR  1.21     Goal of Therapy:  INR 2-3 Monitor platelets by anticoagulation protocol: Yes   Plan:  Will give Warfarin 7.5 mg x 1 dose. Currently ordered Heparin SQ for prophylaxis until INR therapeutic.   Hunter Pinkard A 12/05/2016,10:54 AM

## 2016-12-05 NOTE — H&P (Signed)
[facility]   PATIENT NAME: [name]    MR#:  [id number]  DATE OF BIRTH:  [birth year]  DATE OF ADMISSION:  12/05/2016  PRIMARY CARE PHYSICIAN: Etheleen Nicks, NP   REQUESTING/REFERRING PHYSICIAN: McShane  CHIEF COMPLAINT:   Chief Complaint  Patient presents with  . Chest Pain    HISTORY OF PRESENT ILLNESS: [name]  is a 83 y.o. female with a known history of DVT, COPD, hypertension, former smoker- was doing fine up until last night. Today early morning at 3 she woke up with left-sided chest pain which was pressure-like and sharp it lasted for a few minutes she took Tylenol tablets to help with that and try to go to sleep. She also had some numbness in her left arm along with the pain. She could sleep for a few hours and again around 6 she woke up with the same pain and numbness in the arm which again lasted for a few minutes. She denies associated cough or shortness of breath. Concerned with this she came to emergency room and initial workup was negative for TSH and it CT scan angiogram of the chest and it showed there is a small pneumothorax. Concerned with this he suggested to admit to medical services for further observation and management.   PAST MEDICAL HISTORY:   Past Medical History:  Diagnosis Date  . COPD (chronic obstructive pulmonary disease) (HCC)   . DVT (deep venous thrombosis) (HCC)   . Hypertension   . Pneumothorax     PAST SURGICAL HISTORY: Past Surgical History:  Procedure Laterality Date  . ABDOMINAL HYSTERECTOMY    . colon cancer    . HEMIARTHROPLASTY SHOULDER FRACTURE Left   . TIBIA FRACTURE SURGERY Left     SOCIAL HISTORY:  Social History  Substance Use Topics  . Smoking status: Former Games developer  . Smokeless tobacco: Never Used  . Alcohol use No    FAMILY HISTORY:  Family History  Problem Relation Age of Onset  . Diabetes Maternal Grandmother     DRUG ALLERGIES:  Allergies  Allergen Reactions  .  Nsaids     Pt w/ CKD3  . Aspirin Nausea Only    Abdominal pain    REVIEW OF SYSTEMS:   CONSTITUTIONAL: No fever, fatigue or weakness.  EYES: No blurred or double vision.  EARS, NOSE, AND THROAT: No tinnitus or ear pain.  RESPIRATORY: No cough, shortness of breath, wheezing or hemoptysis.  CARDIOVASCULAR: positive for chest pain,no  orthopnea, edema.  GASTROINTESTINAL: No nausea, vomiting, diarrhea or abdominal pain.  GENITOURINARY: No dysuria, hematuria.  ENDOCRINE: No polyuria, nocturia,  HEMATOLOGY: No anemia, easy bruising or bleeding SKIN: No rash or lesion. MUSCULOSKELETAL: No joint pain or arthritis.   NEUROLOGIC: No tingling, numbness, weakness.  PSYCHIATRY: No anxiety or depression.   MEDICATIONS AT HOME:  Prior to Admission medications   Medication Sig Start Date End Date Taking? Authorizing Provider  acetaminophen (TYLENOL) 650 MG CR tablet Take 650 mg by mouth 2 (two) times daily.   Yes [provider]  ALPRAZolam (XANAX) 0.25 MG tablet Take 1 tablet (0.25 mg total) by mouth 3 (three) times daily as needed for anxiety. 08/11/16  Yes Katha Hamming, MD  amLODipine (NORVASC) 2.5 MG tablet Take 2.5 mg by mouth daily. 03/17/16 03/17/17 Yes [provider]  furosemide (LASIX) 20 MG tablet Take 20 mg by mouth daily. 11/25/16 12/25/16 Yes [provider]  guaiFENesin (MUCINEX) 600 MG 12 hr tablet Take 1  tablet (600 mg total) by mouth 2 (two) times daily. 08/11/16  Yes Katha Hamming, MD  lidocaine (LIDODERM) 5 % Place 1 patch onto the skin daily. Remove & Discard patch within 12 hours or as directed by MD 08/03/16  Yes Shaune Pollack, MD  meclizine (ANTIVERT) 12.5 MG tablet Take 12.5 mg by mouth 3 (three) times daily as needed for dizziness or nausea. 05/02/16 05/02/17 Yes [provider]  Multiple Vitamins-Minerals (PRESERVISION AREDS 2+MULTI VIT PO) Take 1 tablet by mouth 2 (two) times daily.   Yes [provider]  senna (SENOKOT) 8.6  MG TABS tablet Take 1 tablet (8.6 mg total) by mouth at bedtime. 08/01/16  Yes Don Perking, [place], MD  warfarin (COUMADIN) 4 MG tablet Take 1 tablet (4 mg total) by mouth daily at 6 PM. Patient taking differently: Take 5 mg by mouth daily at 6 PM.  08/11/16  Yes Katha Hamming, MD      PHYSICAL EXAMINATION:   VITAL SIGNS: Blood pressure 116/63, pulse 76, temperature 97.8 F (36.6 C), temperature source Oral, resp. rate 18, height 5\' 6"  (1.676 m), weight 90.7 kg (200 lb), SpO2 100 %.  GENERAL:  83 y.o.-year-old patient lying in the bed with no acute distress.  EYES: Pupils equal, round, reactive to light and accommodation. No scleral icterus. Extraocular muscles intact.  HEENT: Head atraumatic, normocephalic. Oropharynx and nasopharynx clear.  NECK:  Supple, no jugular venous distention. No thyroid enlargement, no tenderness.  LUNGS:Decreased  breath sounds on left side, no wheezing, rales,rhonchi or crepitation. No use of accessory muscles of respiration.  CARDIOVASCULAR: S1, S2 normal. No murmurs, rubs, or gallops.  ABDOMEN: Soft, nontender, nondistended. Bowel sounds present. No organomegaly or mass.  EXTREMITIES: No pedal edema, cyanosis, or clubbing.  NEUROLOGIC: Cranial nerves II through XII are intact. Muscle strength 4/5 in upper extremities 2-3/5 in lower extremities. Sensation intact. Gait not checked.  PSYCHIATRIC: The patient is alert and oriented x 3.  SKIN: No obvious rash, lesion, or ulcer.   LABORATORY PANEL:   CBC  Recent Labs Lab 12/05/16 0703  WBC 8.6  HGB 13.1  HCT 38.6  PLT 254  MCV 87.5  MCH 29.6  MCHC 33.9  RDW 14.1   ------------------------------------------------------------------------------------------------------------------  Chemistries   Recent Labs Lab 12/05/16 0703  NA 140  K 4.1  CL 104  CO2 29  GLUCOSE 109*  BUN 25*  CREATININE 1.31*  CALCIUM 9.1    ------------------------------------------------------------------------------------------------------------------ estimated creatinine clearance is 36.3 mL/min (A) (by C-G formula based on SCr of 1.31 mg/dL (H)). ------------------------------------------------------------------------------------------------------------------ No results for input(s): TSH, T4TOTAL, T3FREE, THYROIDAB in the last 72 hours.  Invalid input(s): FREET3   Coagulation profile  Recent Labs Lab 12/05/16 0703  INR 1.21   ------------------------------------------------------------------------------------------------------------------- No results for input(s): DDIMER in the last 72 hours. -------------------------------------------------------------------------------------------------------------------  Cardiac Enzymes  Recent Labs Lab 12/05/16 0703  TROPONINI <0.03   ------------------------------------------------------------------------------------------------------------------ Invalid input(s): POCBNP  ---------------------------------------------------------------------------------------------------------------  Urinalysis    Component Value Date/Time   COLORURINE YELLOW 08/05/2016 1258   APPEARANCEUR CLOUDY (A) 08/05/2016 1258   LABSPEC 1.020 08/05/2016 1258   PHURINE 5.0 08/05/2016 1258   GLUCOSEU NEGATIVE 08/05/2016 1258   HGBUR MODERATE (A) 08/05/2016 1258   BILIRUBINUR NEGATIVE 08/05/2016 1258   KETONESUR NEGATIVE 08/05/2016 1258   PROTEINUR 30 (A) 08/05/2016 1258   NITRITE NEGATIVE 08/05/2016 1258   LEUKOCYTESUR NEGATIVE 08/05/2016 1258     RADIOLOGY: Ct Angio Chest Pe W Or Wo Contrast  Result Date: 12/05/2016 CLINICAL DATA:  Lambert Mody left-sided chest  pain EXAM: CT ANGIOGRAPHY CHEST WITH CONTRAST TECHNIQUE: Multidetector CT imaging of the chest was performed using the standard protocol during bolus administration of intravenous contrast. Multiplanar CT image reconstructions and MIPs  were obtained to evaluate the vascular anatomy. CONTRAST:  60 mL Isovue 370 COMPARISON:  One-view chest x-ray 12/05/2016 FINDINGS: Cardiovascular: The heart is enlarged. Dense atherosclerotic calcifications are present within the coronary arteries. Atherosclerotic changes are noted within the aortic arch and great vessels. Diffuse atherosclerotic calcifications are present in the descending aorta. Pulmonary artery opacification is excellent. No focal filling defects are present to suggest pulmonary emboli. The pulmonary arteries are of normal size. Mediastinum/Nodes: No significant mediastinal or axillary adenopathy is present. Lungs/Pleura: A small left anterior pneumothorax is present. There is a small left pleural effusion is well. Areas of dependent atelectasis are evident bilaterally. No focal nodule, mass, or airspace disease is present otherwise. Upper Abdomen: Within normal limits. Musculoskeletal: No focal lytic or blastic lesions are present. Normal degenerative changes are present within the thoracic spine. Review of the MIP images confirms the above findings. IMPRESSION: 1. Small anterior left pneumothorax without tension. 2. Small left pleural effusion. 3. No pulmonary embolus. 4. Extensive atherosclerotic disease including coronary artery disease. There is no aneurysm or significant stenosis of the great vessel origins. Critical Value/emergent results were called by telephone at the time of interpretation on 12/05/2016 at 8:51 am to Dr. Ileana RoupJAMES MCSHANE , who verbally acknowledged these results. Electronically Signed   By: Marin Robertshristopher  Mattern M.D.   On: 12/05/2016 08:53   Dg Chest Portable 1 View  Result Date: 12/05/2016 CLINICAL DATA:  Chest pain. EXAM: PORTABLE CHEST 1 VIEW COMPARISON:  08/10/2016. FINDINGS: Mediastinum and hilar structures normal. Heart size normal. No focal infiltrate. No pleural effusion or pneumothorax. Total left shoulder replacement. IMPRESSION: No acute cardiopulmonary disease.  Electronically Signed   By: Maisie Fushomas  Register   On: 12/05/2016 07:26    EKG: Orders placed or performed during the hospital encounter of 12/05/16  . EKG 12-Lead  . EKG 12-Lead  . ED EKG within 10 minutes  . ED EKG within 10 minutes  . EKG 12-Lead  . EKG 12-Lead    IMPRESSION AND PLAN:  * Pneumothorax   This is small and no signs of cancer pneumothorax so will monitor with oxygen supplementation.   Gen. surgery consult for further management, most likely we may just need to monitor with follow-up chest x-ray.  * Chest pain   There are findings of coronary atherosclerosis on CT chest also.   We'll monitor on telemetry and follow serial troponin.  * Hypertension   Continue home medications and monitor.  * History of DVT   Currently on Coumadin but INR is low, I will give heparin subcutaneous as prophylaxis and let pharmacy adjust the dose of Coumadin.  * COPD   She denies using any inhalers at home and currently there is no wheezing, so I will just continue monitoring.   All the records are reviewed and case discussed with ED provider. Management plans discussed with the patient, family and they are in agreement.  CODE STATUS: full code  Code Status History    Date Active Date Inactive Code Status Order ID Comments User Context   08/05/2016  8:49 PM 08/11/2016  4:42 PM Full Code [id number]  Katharina CaperVaickute, Rima, MD Inpatient   08/01/2016  8:27 PM 08/03/2016  4:28 PM DNR [id number]  Enedina FinnerPatel, Sona, MD ED     Patient's son was present in the room during  my visit.   TOTAL TIME TAKING CARE OF THIS PATIENT: 45 minutes.    Altamese Dilling M.D on 12/05/2016   Between 7am to 6pm - Pager - [phone]  After 6pm go to [email or web address] - password [email or web address]  [facility]  Office  [phone]  CC: Primary care physician; Etheleen Nicks, NP   Note: This dictation was prepared with Dragon dictation along with smaller phrase technology. Any transcriptional errors that  result from this process are unintentional.

## 2016-12-05 NOTE — ED Triage Notes (Addendum)
Pt to room 1 via EMS from home; A&Ox3; reports awoke at 2am with sharp left sided CP, nonradiating with no accomp symptoms; took 2 tylenol and went to asleep; awoke again PTA with same pain; has been having recent nonprod cough, swelling to LE(s)

## 2016-12-05 NOTE — ED Notes (Signed)
Xray at bedside for PCXR.

## 2016-12-05 NOTE — Progress Notes (Signed)
Son at bedside signed form

## 2016-12-05 NOTE — Consult Note (Signed)
Patient ID: [name], female   DOB: [birth year], 83 y.o.   MRN: [id number]  Chief Complaint  Patient presents with  . Chest Pain    Referred By Dr. Elisabeth PigeonVachhani Reason for Referral incidental left pneumothorax  HPI Location, Quality, Duration, Severity, Timing, Context, Modifying Factors, Associated Signs and Symptoms.  [name] is a 83 y.o. female.  She was awoken from sleep with severe left-sided chest pain radiating down her left arm. Initially she felt that this was related to her heart but then she noticed that her left arm felt heavier and she had difficulty moving it. She called the ambulance and the anvil is brought her to the hospital where an emergent CT scan was performed. Once she was here she states that her pain has been better. She's not had any shortness of breath. The CT scan showed an incidental left pneumothorax. There were no other abnormalities identified with the exception of significant atherosclerosis. She was admitted to the hospital for workup and evaluation of her chest pain.   Past Medical History:  Diagnosis Date  . COPD (chronic obstructive pulmonary disease) (HCC)   . DVT (deep venous thrombosis) (HCC)   . Hypertension   . Pneumothorax     Past Surgical History:  Procedure Laterality Date  . ABDOMINAL HYSTERECTOMY    . colon cancer    . HEMIARTHROPLASTY SHOULDER FRACTURE Left   . TIBIA FRACTURE SURGERY Left     Family History  Problem Relation Age of Onset  . Diabetes Maternal Grandmother     Social History Social History  Substance Use Topics  . Smoking status: Former Games developermoker  . Smokeless tobacco: Never Used  . Alcohol use No    Allergies  Allergen Reactions  . Nsaids     Pt w/ CKD3  . Aspirin Nausea Only    Abdominal pain    Current Facility-Administered Medications  Medication Dose Route Frequency Provider Last Rate Last Dose  . ALPRAZolam (XANAX) tablet 0.25 mg  0.25 mg Oral TID PRN Altamese DillingVachhani, Vaibhavkumar, MD      . amLODipine  (NORVASC) tablet 2.5 mg  2.5 mg Oral Daily Altamese DillingVachhani, Vaibhavkumar, MD      . docusate sodium (COLACE) capsule 100 mg  100 mg Oral BID PRN Altamese DillingVachhani, Vaibhavkumar, MD      . furosemide (LASIX) tablet 20 mg  20 mg Oral Daily Altamese DillingVachhani, Vaibhavkumar, MD      . guaiFENesin (MUCINEX) 12 hr tablet 600 mg  600 mg Oral BID Altamese DillingVachhani, Vaibhavkumar, MD      . heparin injection 5,000 Units  5,000 Units Subcutaneous Q8H Altamese DillingVachhani, Vaibhavkumar, MD      . senna (SENOKOT) tablet 8.6 mg  1 tablet Oral QHS Altamese DillingVachhani, Vaibhavkumar, MD      . warfarin (COUMADIN) tablet 7.5 mg  7.5 mg Oral ONCE-1800 Altamese DillingVachhani, Vaibhavkumar, MD      . Warfarin - Pharmacist Dosing Inpatient   Does not apply [id number] Altamese DillingVachhani, Vaibhavkumar, MD          Review of Systems A complete review of systems was asked and was negative except for the following positive findings increasing weight gain, increasing lower extremity edema, left-sided chest pain (improving)  Blood pressure (!) 131/42, pulse 67, temperature 97.8 F (36.6 C), temperature source Oral, resp. rate 18, height 5\' 6"  (1.676 m), weight 200 lb (90.7 kg), SpO2 100 %.  Physical Exam CONSTITUTIONAL:  Pleasant, well-developed, well-nourished, and in no acute distress. EYES: Pupils equal and reactive to light, Sclera non-icteric  EARS, NOSE, MOUTH AND THROAT:  The oropharynx was clear.  Dentition is absent with both upper and lower dentures.  Oral mucosa pink and moist. LYMPH NODES:  Lymph nodes in the neck and axillae were normal RESPIRATORY:  Lungs were clear.  Normal respiratory effort without pathologic use of accessory muscles of respiration CARDIOVASCULAR: Heart was regular without murmurs.  There were no carotid bruits. GI: The abdomen was soft, nontender, and nondistended. There were no palpable masses. There was no hepatosplenomegaly. There were normal bowel sounds in all quadrants. GU:  Rectal deferred.   MUSCULOSKELETAL:  Normal muscle strength and tone.  No clubbing or  cyanosis.   SKIN:  There were no pathologic skin lesions.  There were no nodules on palpation. PSYCH:  Oriented to person, place and time.  Mood and affect are normal.  Data Reviewed Chest x-ray and CT scan  I have personally reviewed the patient's imaging, laboratory findings and medical records.    Assessment    Left-sided pneumothorax. The patient does have a long-standing smoking history but quit 10 years ago. I do not see any significant emphysematous changes on the lungs. The pneumothorax is quite small and is essentially asymptomatic at this point. Whether or not this was her initial presenting problem with her left-sided chest pain is unclear.    Plan    I would repeat her chest x-ray at this point to see whether or not the pneumothorax is enlarging. However the patient is currently asymptomatic. Her physical exam does not support an enlarging pneumothorax. I can certainly follow the patient up on an outpatient if needed.       Hulda Marin, MD 12/05/2016, 2:03 PM

## 2016-12-05 NOTE — ED Notes (Signed)
Patient transported to CT 

## 2016-12-05 NOTE — Progress Notes (Signed)
Patient complains of pain in left side of chest. SPO2 100 on 3LNC Dr. Elisabeth PigeonVachhani notified for pain medications stated he would place the orders.

## 2016-12-05 NOTE — ED Notes (Signed)
MD McShane at bedside. Pt placed on non re-breather mask.

## 2016-12-05 NOTE — ED Provider Notes (Signed)
[facility] Emergency Department Provider Note  ____________________________________________   I have reviewed the triage vital signs and the nursing notes.   HISTORY  Chief Complaint Chest Pain    HPI [name] is a 83 y.o. female states that she woke up this way with severe left-sided sharp and nonradiating chest pain. Happen at 2 AM, she went to bed, that she woke up again later and had some more. She did receive aspirin and nitroglycerin en route. Her pain is now better. It is a 1 out of 10. She states she's been coughing "right much" she denies pleuritic pain however and she denies productive cough. She does have chronic lower extremity edema, she has been on Coumadin for many years for DVTs 2, she states that her lower leg swelling has been slightly increased recently and they did increase her Lasix.  Patient is baseline not very ambulatory. Chest pain was sharp, left-sided, nonradiating, he may get better on its own although there was also medication given, nothing makes it worse. She denies orthopnea. She has had no other complaints   Past Medical History:  Diagnosis Date  . COPD (chronic obstructive pulmonary disease) (HCC)   . DVT (deep venous thrombosis) (HCC)   . Hypertension     Patient Active Problem List   Diagnosis Date Noted  . Acute respiratory failure with hypoxia (HCC) 08/07/2016  . Influenza 08/05/2016  . Dyspnea 08/05/2016  . Constipation 08/05/2016  . Back pain 08/05/2016  . Generalized weakness 08/05/2016  . Intractable back pain 08/01/2016    Past Surgical History:  Procedure Laterality Date  . ABDOMINAL HYSTERECTOMY    . colon cancer    . HEMIARTHROPLASTY SHOULDER FRACTURE Left   . TIBIA FRACTURE SURGERY Left     Prior to Admission medications   Medication Sig Start Date End Date Taking? Authorizing Provider  acetaminophen (TYLENOL) 650 MG CR tablet Take 650 mg by mouth 2 (two) times daily.   Yes [provider]  ALPRAZolam (XANAX) 0.25 MG tablet Take 1 tablet (0.25 mg total) by mouth 3 (three) times daily as needed for anxiety. 08/11/16  Yes Katha Hamming, MD  amLODipine (NORVASC) 2.5 MG tablet Take 2.5 mg by mouth daily. 03/17/16 03/17/17 Yes [provider]  furosemide (LASIX) 20 MG tablet Take 20 mg by mouth daily. 11/25/16 12/25/16 Yes [provider]  guaiFENesin (MUCINEX) 600 MG 12 hr tablet Take 1 tablet (600 mg total) by mouth 2 (two) times daily. 08/11/16  Yes Katha Hamming, MD  lidocaine (LIDODERM) 5 % Place 1 patch onto the skin daily. Remove & Discard patch within 12 hours or as directed by MD 08/03/16  Yes Shaune Pollack, MD  meclizine (ANTIVERT) 12.5 MG tablet Take 12.5 mg by mouth 3 (three) times daily as needed for dizziness or nausea. 05/02/16 05/02/17 Yes [provider]  Multiple Vitamins-Minerals (PRESERVISION AREDS 2+MULTI VIT PO) Take 1 tablet by mouth 2 (two) times daily.   Yes [provider]  senna (SENOKOT) 8.6 MG TABS tablet Take 1 tablet (8.6 mg total) by mouth at bedtime. 08/01/16  Yes Don Perking, [place], MD  warfarin (COUMADIN) 4 MG tablet Take 1 tablet (4 mg total) by mouth daily at 6 PM. Patient taking differently: Take 5 mg by mouth daily at 6 PM.  08/11/16  Yes Katha Hamming, MD    Allergies Nsaids and Aspirin  No family history on file.  Social History Social History  Substance Use Topics  . Smoking status: Former  Smoker  . Smokeless tobacco: Never Used  . Alcohol use No    Review of Systems  Constitutional: No fever/chills Eyes: No visual changes. ENT: No sore throat. No stiff neck no neck pain Cardiovascular: + chest pain. Respiratory: Denies shortness of breath. Positive for cough Gastrointestinal:   no vomiting.  No diarrhea.  No constipation. Genitourinary: Negative for dysuria. Musculoskeletal: Negative lower extremity swelling Skin: Negative for rash. Neurological: Negative for severe  headaches, focal weakness or numbness.   ____________________________________________   PHYSICAL EXAM:  VITAL SIGNS: ED Triage Vitals  Enc Vitals Group     BP 12/05/16 0700 131/63     Pulse Rate 12/05/16 0700 93     Resp 12/05/16 0700 20     Temp 12/05/16 0700 97.8 F (36.6 C)     Temp Source 12/05/16 0700 Oral     SpO2 12/05/16 0700 92 %     Weight 12/05/16 0658 200 lb (90.7 kg)     Height 12/05/16 0658 5\' 6"  (1.676 m)     Head Circumference --      Peak Flow --      Pain Score 12/05/16 0657 1     Pain Loc --      Pain Edu? --      Excl. in GC? --     Constitutional: Alert and oriented. Well appearing and in no acute distress. Eyes: Conjunctivae are normal Head: Atraumatic HEENT: No congestion/rhinnorhea. Mucous membranes are moist.  Oropharynx non-erythematous Neck:   Nontender with no meningismus, no masses, no stridor Cardiovascular: Normal rate, regular rhythm. Grossly normal heart sounds.  Good peripheral circulation. Chest: Left-sided chest wall tenderness, when I touch this area patient states "ouch that the pain right there" and pulls back. No shingles, no rash, no palpated fracture, no crepitus no inflammatory changes to the skin Respiratory: Normal respiratory effort.  No retractions. Lungs CTAB. Abdominal: Soft and nontender. No distention. No guarding no rebound Back:  There is no focal tenderness or step off.  there is no midline tenderness there are no lesions noted. there is no CVA tenderness Musculoskeletal: No lower extremity tenderness, no upper extremity tenderness. No joint effusions, no DVT signs strong distal pulses no edema Neurologic:  Normal speech and language. No gross focal neurologic deficits are appreciated.  Skin:  Skin is warm, dry and intact. No rash noted. Psychiatric: Mood and affect are normal. Speech and behavior are normal.  ____________________________________________   LABS (all labs ordered are listed, but only abnormal results  are displayed)  Labs Reviewed  BASIC METABOLIC PANEL - Abnormal; Notable for the following:       Result Value   Glucose, Bld 109 (*)    BUN 25 (*)    Creatinine, Ser 1.31 (*)    GFR calc non Af Amer 36 (*)    GFR calc Af Amer 42 (*)    All other components within normal limits  PROTIME-INR - Abnormal; Notable for the following:    Prothrombin Time 15.4 (*)    All other components within normal limits  CBC  TROPONIN I  APTT  BRAIN NATRIURETIC PEPTIDE   ____________________________________________  EKG  I personally interpreted any EKGs ordered by me or triage Sinus rhythm partial right bundle branch block, no acute ST elevation or depression on some ST changes LAD noted. Rate 92 ____________________________________________  RADIOLOGY  I reviewed any imaging ordered by me or triage that were performed during my shift and, if possible, patient and/or family made aware  of any abnormal findings. ____________________________________________   PROCEDURES  Procedure(s) performed: None  Procedures  Critical Care performed: None  ____________________________________________   INITIAL IMPRESSION / ASSESSMENT AND PLAN / ED COURSE  Pertinent labs & imaging results that were available during my care of the patient were reviewed by me and considered in my medical decision making (see chart for details).  Current outpatient actually has a small pneumothorax, 5%, will not require a tube however her sats 89-90% on room air, no PE, and work otherwise reassuring, we will place her on a nonrebreather, given that her oxygen saturations were 89-91% on room air here, I think she may benefit from a observational admission to make sure this does not get worse. However, is my hope that it will rapidly improved. She said therapeutic on her INR I will forestall immediate replacement given possibility of a chest tube as an inpatient.    ____________________________________________   FINAL  CLINICAL IMPRESSION(S) / ED DIAGNOSES  Final diagnoses:  Chest pain  Spontaneous pneumothorax      This chart was dictated using voice recognition software.  Despite best efforts to proofread,  errors can occur which can change meaning.      Jeanmarie Plant, MD 12/05/16 [id number]

## 2016-12-05 NOTE — Care Management Obs Status (Signed)
MEDICARE OBSERVATION STATUS NOTIFICATION   Patient Details  Name: [name] MRN: [id number] Date of Birth: 1930-04-30   Medicare Observation Status Notification Given:  Yes    Berna BueCheryl Daron Breeding, RN 12/05/2016, 10:26 AM

## 2016-12-06 ENCOUNTER — Observation Stay: Payer: Medicare Other

## 2016-12-06 DIAGNOSIS — J9383 Other pneumothorax: Secondary | ICD-10-CM | POA: Diagnosis not present

## 2016-12-06 LAB — CBC
HEMATOCRIT: 36.7 % (ref 35.0–47.0)
HEMOGLOBIN: 12.3 g/dL (ref 12.0–16.0)
MCH: 29.7 pg (ref 26.0–34.0)
MCHC: 33.5 g/dL (ref 32.0–36.0)
MCV: 88.7 fL (ref 80.0–100.0)
Platelets: 245 10*3/uL (ref 150–440)
RBC: 4.14 MIL/uL (ref 3.80–5.20)
RDW: 14.6 % — AB (ref 11.5–14.5)
WBC: 9.5 10*3/uL (ref 3.6–11.0)

## 2016-12-06 LAB — BASIC METABOLIC PANEL
ANION GAP: 7 (ref 5–15)
BUN: 24 mg/dL — AB (ref 6–20)
CO2: 30 mmol/L (ref 22–32)
Calcium: 8.8 mg/dL — ABNORMAL LOW (ref 8.9–10.3)
Chloride: 104 mmol/L (ref 101–111)
Creatinine, Ser: 1.35 mg/dL — ABNORMAL HIGH (ref 0.44–1.00)
GFR calc Af Amer: 41 mL/min — ABNORMAL LOW (ref >60)
GFR calc non Af Amer: 35 mL/min — ABNORMAL LOW (ref >60)
Glucose, Bld: 110 mg/dL — ABNORMAL HIGH (ref 65–99)
POTASSIUM: 4.6 mmol/L (ref 3.5–5.1)
SODIUM: 141 mmol/L (ref 135–145)

## 2016-12-06 LAB — PROTIME-INR
INR: 1.43
Prothrombin Time: 17.6 seconds — ABNORMAL HIGH (ref 11.4–15.2)

## 2016-12-06 LAB — HEMOGLOBIN A1C
Hgb A1c MFr Bld: 5.7 % — ABNORMAL HIGH (ref 4.8–5.6)
Mean Plasma Glucose: 117 mg/dL

## 2016-12-06 MED ORDER — IPRATROPIUM-ALBUTEROL 0.5-2.5 (3) MG/3ML IN SOLN
3.0000 mL | Freq: Four times a day (QID) | RESPIRATORY_TRACT | Status: DC
  Administered 2016-12-06 – 2016-12-08 (×9): 3 mL via RESPIRATORY_TRACT
  Filled 2016-12-06 (×10): qty 3

## 2016-12-06 MED ORDER — METHYLPREDNISOLONE SODIUM SUCC 125 MG IJ SOLR
60.0000 mg | Freq: Two times a day (BID) | INTRAMUSCULAR | Status: DC
  Administered 2016-12-06 – 2016-12-08 (×5): 60 mg via INTRAVENOUS
  Filled 2016-12-06 (×5): qty 2

## 2016-12-06 MED ORDER — WARFARIN SODIUM 5 MG PO TABS
7.5000 mg | ORAL_TABLET | Freq: Once | ORAL | Status: AC
  Administered 2016-12-06: 7.5 mg via ORAL
  Filled 2016-12-06: qty 2

## 2016-12-06 NOTE — Progress Notes (Signed)
CC: PTX Subjective: CXR personally reviewed improving PTX Doing well No C/P or SOB currently, some mild pain earlier today  Objective: Vital signs in last 24 hours: Temp:  [98.3 F (36.8 C)-98.4 F (36.9 C)] 98.4 F (36.9 C) (06/02 0440) Pulse Rate:  [67-92] 91 (06/02 0745) Resp:  [18] 18 (06/02 0745) BP: (125-133)/(42-57) 128/47 (06/02 0912) SpO2:  [98 %-100 %] 100 % (06/02 0745) Last BM Date: 12/04/16  Intake/Output from previous day: No intake/output data recorded. Intake/Output this shift: No intake/output data recorded.  Physical exam: NAD, Chest no use of acc muscle, normal breathing . BS decrease both bases. On Early O2 Abd: soft, NT Neuro: awake alert, follows commands. GCS 15 no motor or sens deficits  Lab Results: CBC   Recent Labs  12/05/16 0703 12/06/16 0426  WBC 8.6 9.5  HGB 13.1 12.3  HCT 38.6 36.7  PLT 254 245   BMET  Recent Labs  12/05/16 0703 12/06/16 0426  NA 140 141  K 4.1 4.6  CL 104 104  CO2 29 30  GLUCOSE 109* 110*  BUN 25* 24*  CREATININE 1.31* 1.35*  CALCIUM 9.1 8.8*   PT/INR  Recent Labs  12/05/16 0703 12/06/16 0426  LABPROT 15.4* 17.6*  INR 1.21 1.43   ABG No results for input(s): PHART, HCO3 in the last 72 hours.  Invalid input(s): PCO2, PO2  Studies/Results: Dg Chest 2 View  Result Date: 12/06/2016 CLINICAL DATA:  History of spontaneous left pneumothorax. EXAM: CHEST  2 VIEW COMPARISON:  CT chest and single view of the chest 12/05/2016. FINDINGS: Tiny left apical pneumothorax is slightly decreased in size. Minimal left basilar atelectasis is seen. The right lung is expanded and clear. Heart size is normal. Atherosclerosis noted. IMPRESSION: Slight decrease in a tiny left apical pneumothorax. No new abnormality. Electronically Signed   By: Drusilla Kanner M.D.   On: 12/06/2016 08:27   Ct Angio Chest Pe W Or Wo Contrast  Result Date: 12/05/2016 CLINICAL DATA:  Lambert Mody left-sided chest pain EXAM: CT ANGIOGRAPHY CHEST WITH  CONTRAST TECHNIQUE: Multidetector CT imaging of the chest was performed using the standard protocol during bolus administration of intravenous contrast. Multiplanar CT image reconstructions and MIPs were obtained to evaluate the vascular anatomy. CONTRAST:  60 mL Isovue 370 COMPARISON:  One-view chest x-ray 12/05/2016 FINDINGS: Cardiovascular: The heart is enlarged. Dense atherosclerotic calcifications are present within the coronary arteries. Atherosclerotic changes are noted within the aortic arch and great vessels. Diffuse atherosclerotic calcifications are present in the descending aorta. Pulmonary artery opacification is excellent. No focal filling defects are present to suggest pulmonary emboli. The pulmonary arteries are of normal size. Mediastinum/Nodes: No significant mediastinal or axillary adenopathy is present. Lungs/Pleura: A small left anterior pneumothorax is present. There is a small left pleural effusion is well. Areas of dependent atelectasis are evident bilaterally. No focal nodule, mass, or airspace disease is present otherwise. Upper Abdomen: Within normal limits. Musculoskeletal: No focal lytic or blastic lesions are present. Normal degenerative changes are present within the thoracic spine. Review of the MIP images confirms the above findings. IMPRESSION: 1. Small anterior left pneumothorax without tension. 2. Small left pleural effusion. 3. No pulmonary embolus. 4. Extensive atherosclerotic disease including coronary artery disease. There is no aneurysm or significant stenosis of the great vessel origins. Critical Value/emergent results were called by telephone at the time of interpretation on 12/05/2016 at 8:51 am to Dr. Ileana Roup , who verbally acknowledged these results. Electronically Signed   By: Marin Roberts  M.D.   On: 12/05/2016 08:53   Dg Chest Port 1 View  Result Date: 12/05/2016 CLINICAL DATA:  Chest pain today with a small left pneumothorax seen on CT scan. EXAM:  PORTABLE CHEST 1 VIEW COMPARISON:  Single-view of the chest and CT chest earlier today. FINDINGS: Very small left apical pneumothorax, less than 5%, of is identified. Minimal left basilar atelectasis is seen. The right lung is expanded and clear. Heart size is normal. Atherosclerosis noted. No acute bony abnormality. Reverse left shoulder arthroplasty noted. IMPRESSION: Very small left apical pneumothorax, 5% or less. Atherosclerosis. Electronically Signed   By: Drusilla Kannerhomas  Dalessio M.D.   On: 12/05/2016 13:56   Dg Chest Portable 1 View  Result Date: 12/05/2016 CLINICAL DATA:  Chest pain. EXAM: PORTABLE CHEST 1 VIEW COMPARISON:  08/10/2016. FINDINGS: Mediastinum and hilar structures normal. Heart size normal. No focal infiltrate. No pleural effusion or pneumothorax. Total left shoulder replacement. IMPRESSION: No acute cardiopulmonary disease. Electronically Signed   By: Maisie Fushomas  Register   On: 12/05/2016 07:26    Anti-infectives: Anti-infectives    None      Assessment/Plan: Spont PTX improving No need for CT May follow as oupt No surgical issue   Sterling Bigiego Pabon, MD, [facility]  12/06/2016

## 2016-12-06 NOTE — Progress Notes (Signed)
[employer] - [place] [facility]   PATIENT NAME: [name]    MR#:  [id number]  DATE OF BIRTH:  [birth year]  SUBJECTIVE:  CHIEF COMPLAINT:   Chief Complaint  Patient presents with  . Chest Pain   Continues to have left-sided chest pain. Shortness of breath present. No cough or sputum.  REVIEW OF SYSTEMS:    Review of Systems  Constitutional: Positive for malaise/fatigue. Negative for chills and fever.  HENT: Negative for sore throat.   Eyes: Negative for blurred vision, double vision and pain.  Respiratory: Positive for shortness of breath. Negative for cough, hemoptysis and wheezing.   Cardiovascular: Positive for chest pain. Negative for palpitations, orthopnea and leg swelling.  Gastrointestinal: Negative for abdominal pain, constipation, diarrhea, heartburn, nausea and vomiting.  Genitourinary: Negative for dysuria and hematuria.  Musculoskeletal: Negative for back pain and joint pain.  Skin: Negative for rash.  Neurological: Positive for weakness. Negative for sensory change, speech change, focal weakness and headaches.  Endo/Heme/Allergies: Does not bruise/bleed easily.  Psychiatric/Behavioral: Negative for depression. The patient is not nervous/anxious.     DRUG ALLERGIES:   Allergies  Allergen Reactions  . Nsaids     Pt w/ CKD3  . Aspirin Nausea Only    Abdominal pain    VITALS:  Blood pressure (!) 128/47, pulse 91, temperature 98.4 F (36.9 C), temperature source Oral, resp. rate 18, height 5\' 6"  (1.676 m), weight 90.7 kg (200 lb), SpO2 100 %.  PHYSICAL EXAMINATION:   Physical Exam  GENERAL:  83 y.o.-year-old patient lying in the bed with no acute distress.  EYES: Pupils equal, round, reactive to light and accommodation. No scleral icterus. Extraocular muscles intact.  HEENT: Head atraumatic, normocephalic. Oropharynx and nasopharynx clear.  NECK:  Supple, no jugular venous distention. No thyroid enlargement, no tenderness.  LUNGS:  Poor air entry with bilateral wheezing. CARDIOVASCULAR: S1, S2 normal. No murmurs, rubs, or gallops.  ABDOMEN: Soft, nontender, nondistended. Bowel sounds present. No organomegaly or mass.  EXTREMITIES: No cyanosis, clubbing or edema b/l.    NEUROLOGIC: Cranial nerves II through XII are intact. No focal Motor or sensory deficits b/l.   PSYCHIATRIC: The patient is alert and oriented x 3.  SKIN: No obvious rash, lesion, or ulcer.   LABORATORY PANEL:   CBC  Recent Labs Lab 12/06/16 0426  WBC 9.5  HGB 12.3  HCT 36.7  PLT 245   ------------------------------------------------------------------------------------------------------------------ Chemistries   Recent Labs Lab 12/06/16 0426  NA 141  K 4.6  CL 104  CO2 30  GLUCOSE 110*  BUN 24*  CREATININE 1.35*  CALCIUM 8.8*   ------------------------------------------------------------------------------------------------------------------  Cardiac Enzymes  Recent Labs Lab 12/05/16 1748  TROPONINI <0.03   ------------------------------------------------------------------------------------------------------------------  RADIOLOGY:  Dg Chest 2 View  Result Date: 12/06/2016 CLINICAL DATA:  History of spontaneous left pneumothorax. EXAM: CHEST  2 VIEW COMPARISON:  CT chest and single view of the chest 12/05/2016. FINDINGS: Tiny left apical pneumothorax is slightly decreased in size. Minimal left basilar atelectasis is seen. The right lung is expanded and clear. Heart size is normal. Atherosclerosis noted. IMPRESSION: Slight decrease in a tiny left apical pneumothorax. No new abnormality. Electronically Signed   By: Drusilla Kannerhomas  Dalessio M.D.   On: 12/06/2016 08:27   Ct Angio Chest Pe W Or Wo Contrast  Result Date: 12/05/2016 CLINICAL DATA:  Lambert ModySharp left-sided chest pain EXAM: CT ANGIOGRAPHY CHEST WITH CONTRAST TECHNIQUE: Multidetector CT imaging of the chest was performed using the standard protocol during bolus administration of  intravenous contrast. Multiplanar CT image reconstructions and MIPs were obtained to evaluate the vascular anatomy. CONTRAST:  60 mL Isovue 370 COMPARISON:  One-view chest x-ray 12/05/2016 FINDINGS: Cardiovascular: The heart is enlarged. Dense atherosclerotic calcifications are present within the coronary arteries. Atherosclerotic changes are noted within the aortic arch and great vessels. Diffuse atherosclerotic calcifications are present in the descending aorta. Pulmonary artery opacification is excellent. No focal filling defects are present to suggest pulmonary emboli. The pulmonary arteries are of normal size. Mediastinum/Nodes: No significant mediastinal or axillary adenopathy is present. Lungs/Pleura: A small left anterior pneumothorax is present. There is a small left pleural effusion is well. Areas of dependent atelectasis are evident bilaterally. No focal nodule, mass, or airspace disease is present otherwise. Upper Abdomen: Within normal limits. Musculoskeletal: No focal lytic or blastic lesions are present. Normal degenerative changes are present within the thoracic spine. Review of the MIP images confirms the above findings. IMPRESSION: 1. Small anterior left pneumothorax without tension. 2. Small left pleural effusion. 3. No pulmonary embolus. 4. Extensive atherosclerotic disease including coronary artery disease. There is no aneurysm or significant stenosis of the great vessel origins. Critical Value/emergent results were called by telephone at the time of interpretation on 12/05/2016 at 8:51 am to Dr. Ileana Roup , who verbally acknowledged these results. Electronically Signed   By: Marin Roberts M.D.   On: 12/05/2016 08:53   Dg Chest Port 1 View  Result Date: 12/05/2016 CLINICAL DATA:  Chest pain today with a small left pneumothorax seen on CT scan. EXAM: PORTABLE CHEST 1 VIEW COMPARISON:  Single-view of the chest and CT chest earlier today. FINDINGS: Very small left apical pneumothorax,  less than 5%, of is identified. Minimal left basilar atelectasis is seen. The right lung is expanded and clear. Heart size is normal. Atherosclerosis noted. No acute bony abnormality. Reverse left shoulder arthroplasty noted. IMPRESSION: Very small left apical pneumothorax, 5% or less. Atherosclerosis. Electronically Signed   By: Drusilla Kanner M.D.   On: 12/05/2016 13:56   Dg Chest Portable 1 View  Result Date: 12/05/2016 CLINICAL DATA:  Chest pain. EXAM: PORTABLE CHEST 1 VIEW COMPARISON:  08/10/2016. FINDINGS: Mediastinum and hilar structures normal. Heart size normal. No focal infiltrate. No pleural effusion or pneumothorax. Total left shoulder replacement. IMPRESSION: No acute cardiopulmonary disease. Electronically Signed   By: Maisie Fus  Register   On: 12/05/2016 07:26     ASSESSMENT AND PLAN:   * Acute hypoxic respiratory failure due to COPD exacerbation -IV steroids - Scheduled Nebulizers - Inhalers -Wean O2 as tolerated - Consult pulmonary if no improvement  * Left pneumothorax. Small. Improving on repeat chest x-ray. Appreciate surgical input. Continue to monitor.  * Left chest pain due to pneumothorax is controlled with pain medications as needed.  * Hypertension. Amlodipine held due to low normal blood pressure.  * History of DVT. On Coumadin.  All the records are reviewed and case discussed with Care Management/Social Workerr. Management plans discussed with the patient, family and they are in agreement.  CODE STATUS: FULL CODE  DVT Prophylaxis: SCDs  TOTAL TIME TAKING CARE OF THIS PATIENT: 35 minutes.   POSSIBLE D/C IN 2-3 DAYS, DEPENDING ON CLINICAL CONDITION.  Milagros Loll R M.D on 12/06/2016 at 10:59 AM  Between 7am to 6pm - Pager - [phone]  After 6pm go to [email or web address] - password [email or web address] [facility]  [facility] [place] [facility]  Office  [phone]  CC: Primary care physician; Etheleen Nicks, NP  Note: This dictation was prepared with Dragon dictation  along with smaller phrase technology. Any transcriptional errors that result from this process are unintentional.

## 2016-12-06 NOTE — Progress Notes (Signed)
ANTICOAGULATION CONSULT NOTE - Initial Consult  Pharmacy Consult for Warfarin Indication: hx of DVT  Allergies  Allergen Reactions  . Nsaids     Pt w/ CKD3  . Aspirin Nausea Only    Abdominal pain    Patient Measurements: Height: 5\' 6"  (167.6 cm) Weight: 200 lb (90.7 kg) IBW/kg (Calculated) : 59.3 Heparin Dosing Weight:    Vital Signs: Temp: 98.4 F (36.9 C) (06/02 0440) Temp Source: Oral (06/02 0440) BP: 133/48 (06/02 0745) Pulse Rate: 91 (06/02 0745)  Labs:  Recent Labs  12/05/16 0703 12/05/16 1002 12/05/16 1357 12/05/16 1748 12/06/16 0426  HGB 13.1  --   --   --  12.3  HCT 38.6  --   --   --  36.7  PLT 254  --   --   --  245  APTT 34  --   --   --   --   LABPROT 15.4*  --   --   --  17.6*  INR 1.21  --   --   --  1.43  CREATININE 1.31*  --   --   --  1.35*  TROPONINI <0.03 <0.03 <0.03 <0.03  --     Estimated Creatinine Clearance: 35.2 mL/min (A) (by C-G formula based on SCr of 1.35 mg/dL (H)).   Medical History: Past Medical History:  Diagnosis Date  . COPD (chronic obstructive pulmonary disease) (HCC)   . DVT (deep venous thrombosis) (HCC)   . Hypertension   . Pneumothorax     Medications:  Scheduled:  . amLODipine  2.5 mg Oral Daily  . furosemide  20 mg Oral Daily  . guaiFENesin  600 mg Oral BID  . heparin  5,000 Units Subcutaneous Q8H  . senna  1 tablet Oral QHS  . Warfarin - Pharmacist Dosing Inpatient   Does not apply q1800   Infusions:    Assessment: 83  Yo F admitted with Chest pain. Patient on warfarin 5 mg daily at home for hx of DVT.  6/1  INR  1.21; 7.5  6/2  INR:  1.43; 7.5 mg    Goal of Therapy:  INR 2-3 Monitor platelets by anticoagulation protocol: Yes   Plan:  Will give Warfarin 7.5 mg x 1 dose. Currently ordered Heparin SQ for prophylaxis until INR therapeutic.   Quintus Premo D 12/06/2016,8:58 AM

## 2016-12-07 ENCOUNTER — Observation Stay: Payer: Medicare Other

## 2016-12-07 DIAGNOSIS — Z86718 Personal history of other venous thrombosis and embolism: Secondary | ICD-10-CM | POA: Diagnosis not present

## 2016-12-07 DIAGNOSIS — J9383 Other pneumothorax: Secondary | ICD-10-CM | POA: Diagnosis present

## 2016-12-07 DIAGNOSIS — Z87891 Personal history of nicotine dependence: Secondary | ICD-10-CM | POA: Diagnosis not present

## 2016-12-07 DIAGNOSIS — I129 Hypertensive chronic kidney disease with stage 1 through stage 4 chronic kidney disease, or unspecified chronic kidney disease: Secondary | ICD-10-CM | POA: Diagnosis present

## 2016-12-07 DIAGNOSIS — Z7901 Long term (current) use of anticoagulants: Secondary | ICD-10-CM | POA: Diagnosis not present

## 2016-12-07 DIAGNOSIS — J441 Chronic obstructive pulmonary disease with (acute) exacerbation: Secondary | ICD-10-CM | POA: Diagnosis present

## 2016-12-07 DIAGNOSIS — Z79899 Other long term (current) drug therapy: Secondary | ICD-10-CM | POA: Diagnosis not present

## 2016-12-07 DIAGNOSIS — J9600 Acute respiratory failure, unspecified whether with hypoxia or hypercapnia: Secondary | ICD-10-CM | POA: Diagnosis present

## 2016-12-07 DIAGNOSIS — J9601 Acute respiratory failure with hypoxia: Secondary | ICD-10-CM | POA: Diagnosis present

## 2016-12-07 DIAGNOSIS — N183 Chronic kidney disease, stage 3 (moderate): Secondary | ICD-10-CM | POA: Diagnosis present

## 2016-12-07 DIAGNOSIS — Z96612 Presence of left artificial shoulder joint: Secondary | ICD-10-CM | POA: Diagnosis present

## 2016-12-07 LAB — PROTIME-INR
INR: 1.65
PROTHROMBIN TIME: 19.7 s — AB (ref 11.4–15.2)

## 2016-12-07 MED ORDER — WARFARIN SODIUM 5 MG PO TABS
5.0000 mg | ORAL_TABLET | Freq: Once | ORAL | Status: AC
  Administered 2016-12-07: 5 mg via ORAL

## 2016-12-07 NOTE — Progress Notes (Signed)
ANTICOAGULATION CONSULT NOTE - Initial Consult  Pharmacy Consult for Warfarin Indication: hx of DVT  Allergies  Allergen Reactions  . Nsaids     Pt w/ CKD3  . Aspirin Nausea Only    Abdominal pain    Patient Measurements: Height: 5\' 6"  (167.6 cm) Weight: 200 lb (90.7 kg) IBW/kg (Calculated) : 59.3 Heparin Dosing Weight:    Vital Signs: Temp: 98 F (36.7 C) (06/03 0800) Temp Source: Oral (06/03 0800) BP: 152/50 (06/03 0800) Pulse Rate: 72 (06/03 0800)  Labs:  Recent Labs  12/05/16 0703 12/05/16 1002 12/05/16 1357 12/05/16 1748 12/06/16 0426 12/07/16 0516  HGB 13.1  --   --   --  12.3  --   HCT 38.6  --   --   --  36.7  --   PLT 254  --   --   --  245  --   APTT 34  --   --   --   --   --   LABPROT 15.4*  --   --   --  17.6* 19.7*  INR 1.21  --   --   --  1.43 1.65  CREATININE 1.31*  --   --   --  1.35*  --   TROPONINI <0.03 <0.03 <0.03 <0.03  --   --     Estimated Creatinine Clearance: 35.2 mL/min (A) (by C-G formula based on SCr of 1.35 mg/dL (H)).   Medical History: Past Medical History:  Diagnosis Date  . COPD (chronic obstructive pulmonary disease) (HCC)   . DVT (deep venous thrombosis) (HCC)   . Hypertension   . Pneumothorax     Medications:  Scheduled:  . amLODipine  2.5 mg Oral Daily  . furosemide  20 mg Oral Daily  . guaiFENesin  600 mg Oral BID  . heparin  5,000 Units Subcutaneous Q8H  . ipratropium-albuterol  3 mL Nebulization Q6H  . methylPREDNISolone (SOLU-MEDROL) injection  60 mg Intravenous Q12H  . senna  1 tablet Oral QHS  . Warfarin - Pharmacist Dosing Inpatient   Does not apply q1800   Infusions:    Assessment: 83  Yo F admitted with Chest pain. Patient on warfarin 5 mg daily at home for hx of DVT.  6/1  INR  1.21; 7.5  6/2  INR:  1.43; 7.5 mg  6/3  INR:   1.65; 5 mg    Goal of Therapy:  INR 2-3 Monitor platelets by anticoagulation protocol: Yes   Plan:  Will give Warfarin 5 mg x 1 dose. Currently ordered Heparin SQ  for prophylaxis until INR therapeutic.   Kamea Dacosta D 12/07/2016,9:35 AM

## 2016-12-07 NOTE — Progress Notes (Signed)
[facility] - [place] [facility]   PATIENT NAME: [name]    MR#:  [id number]  DATE OF BIRTH:  [birth year]  SUBJECTIVE:  CHIEF COMPLAINT:   Chief Complaint  Patient presents with  . Chest Pain   Left chest pain is improved. Continues to have shortness of breath. On 3 L oxygen.  REVIEW OF SYSTEMS:    Review of Systems  Constitutional: Positive for malaise/fatigue. Negative for chills and fever.  HENT: Negative for sore throat.   Eyes: Negative for blurred vision, double vision and pain.  Respiratory: Positive for shortness of breath. Negative for cough, hemoptysis and wheezing.   Cardiovascular: Positive for chest pain. Negative for palpitations, orthopnea and leg swelling.  Gastrointestinal: Negative for abdominal pain, constipation, diarrhea, heartburn, nausea and vomiting.  Genitourinary: Negative for dysuria and hematuria.  Musculoskeletal: Negative for back pain and joint pain.  Skin: Negative for rash.  Neurological: Positive for weakness. Negative for sensory change, speech change, focal weakness and headaches.  Endo/Heme/Allergies: Does not bruise/bleed easily.  Psychiatric/Behavioral: Negative for depression. The patient is not nervous/anxious.     DRUG ALLERGIES:   Allergies  Allergen Reactions  . Nsaids     Pt w/ CKD3  . Aspirin Nausea Only    Abdominal pain    VITALS:  Blood pressure (!) 152/50, pulse 72, temperature 98 F (36.7 C), temperature source Oral, resp. rate 18, height 5\' 6"  (1.676 m), weight 90.7 kg (200 lb), SpO2 92 %.  PHYSICAL EXAMINATION:   Physical Exam  GENERAL:  83 y.o.-year-old patient lying in the bed with no acute distress.  EYES: Pupils equal, round, reactive to light and accommodation. No scleral icterus. Extraocular muscles intact.  HEENT: Head atraumatic, normocephalic. Oropharynx and nasopharynx clear.  NECK:  Supple, no jugular venous distention. No thyroid enlargement, no tenderness.  LUNGS: Poor air  entry with bilateral wheezing. CARDIOVASCULAR: S1, S2 normal. No murmurs, rubs, or gallops.  ABDOMEN: Soft, nontender, nondistended. Bowel sounds present. No organomegaly or mass.  EXTREMITIES: No cyanosis, clubbing or edema b/l.    NEUROLOGIC: Cranial nerves II through XII are intact. No focal Motor or sensory deficits b/l.   PSYCHIATRIC: The patient is alert and oriented x 3.  SKIN: No obvious rash, lesion, or ulcer.   LABORATORY PANEL:   CBC  Recent Labs Lab 12/06/16 0426  WBC 9.5  HGB 12.3  HCT 36.7  PLT 245   ------------------------------------------------------------------------------------------------------------------ Chemistries   Recent Labs Lab 12/06/16 0426  NA 141  K 4.6  CL 104  CO2 30  GLUCOSE 110*  BUN 24*  CREATININE 1.35*  CALCIUM 8.8*   ------------------------------------------------------------------------------------------------------------------  Cardiac Enzymes  Recent Labs Lab 12/05/16 1748  TROPONINI <0.03   ------------------------------------------------------------------------------------------------------------------  RADIOLOGY:  Dg Chest 2 View  Result Date: 12/07/2016 CLINICAL DATA:  Follow-up left pneumothorax. EXAM: CHEST  2 VIEW COMPARISON:  12/06/2016 and prior studies FINDINGS: Upper limits normal heart size again noted. The tiny left apical pneumothorax identified on the prior study is not visible on today's radiograph. Slightly improved left basilar atelectasis noted. There is no evidence of pleural effusion. IMPRESSION: Tiny left apical pneumothorax identified on the prior study is not visible on today's radiograph. Slightly improved bibasilar atelectasis. Electronically Signed   By: Harmon PierJeffrey  Hu M.D.   On: 12/07/2016 08:50   Dg Chest 2 View  Result Date: 12/06/2016 CLINICAL DATA:  History of spontaneous left pneumothorax. EXAM: CHEST  2 VIEW COMPARISON:  CT chest and single view of the chest 12/05/2016.  FINDINGS: Tiny left  apical pneumothorax is slightly decreased in size. Minimal left basilar atelectasis is seen. The right lung is expanded and clear. Heart size is normal. Atherosclerosis noted. IMPRESSION: Slight decrease in a tiny left apical pneumothorax. No new abnormality. Electronically Signed   By: Drusilla Kanner M.D.   On: 12/06/2016 08:27   Dg Chest Port 1 View  Result Date: 12/05/2016 CLINICAL DATA:  Chest pain today with a small left pneumothorax seen on CT scan. EXAM: PORTABLE CHEST 1 VIEW COMPARISON:  Single-view of the chest and CT chest earlier today. FINDINGS: Very small left apical pneumothorax, less than 5%, of is identified. Minimal left basilar atelectasis is seen. The right lung is expanded and clear. Heart size is normal. Atherosclerosis noted. No acute bony abnormality. Reverse left shoulder arthroplasty noted. IMPRESSION: Very small left apical pneumothorax, 5% or less. Atherosclerosis. Electronically Signed   By: Drusilla Kanner M.D.   On: 12/05/2016 13:56     ASSESSMENT AND PLAN:   * Acute hypoxic respiratory failure due to COPD exacerbation -IV steroids, Scheduled Nebulizers - Inhalers -Wean O2 as tolerated - Consult pulmonary if no improvement  * Left pneumothorax. Small. Resolved on repeat chest x-ray. Appreciate surgical input. Continue to monitor.  * Left chest pain due to pneumothorax is improved  * Hypertension. Amlodipine  * History of DVT. On Coumadin.  All the records are reviewed and case discussed with Care Management/Social Workerr. Management plans discussed with the patient, family and they are in agreement.  CODE STATUS: FULL CODE  DVT Prophylaxis: SCDs  TOTAL TIME TAKING CARE OF THIS PATIENT: 35 minutes.   POSSIBLE D/C IN 1-2 DAYS, DEPENDING ON CLINICAL CONDITION.  Milagros Loll R M.D on 12/07/2016 at 10:06 AM  Between 7am to 6pm - Pager - [phone]  After 6pm go to [email or web address] - password [email or web address] [facility]  [facility] [place] [facility]  Office   [phone]  CC: Primary care physician; Etheleen Nicks, NP  Note: This dictation was prepared with Dragon dictation along with smaller phrase technology. Any transcriptional errors that result from this process are unintentional.

## 2016-12-08 LAB — PROTIME-INR
INR: 2.41
Prothrombin Time: 26.7 seconds — ABNORMAL HIGH (ref 11.4–15.2)

## 2016-12-08 MED ORDER — WARFARIN SODIUM 5 MG PO TABS
5.0000 mg | ORAL_TABLET | Freq: Once | ORAL | Status: AC
  Administered 2016-12-08: 5 mg via ORAL
  Filled 2016-12-08: qty 1

## 2016-12-08 MED ORDER — PREDNISONE 20 MG PO TABS: 40.0000 mg | ORAL_TABLET | Freq: Every day | ORAL | 0 refills | Status: DC

## 2016-12-08 NOTE — Progress Notes (Signed)
Discharge instructions explained to pt/ verbalized an understanding / iv and tele removed/ transported off unit via wheelchair.  

## 2016-12-08 NOTE — Evaluation (Signed)
Physical Therapy Evaluation Patient Details Name: [name] MRN: [id number] DOB: 1930/02/18 Today's Date: 12/08/2016   History of Present Illness  Pt is a 83 y/o F who presented with chest pain which was pressure-like sharp with inspiration. CT angiogram of the chest showed small pneumothorax. PT with acute hypoxic respiratory failure due to COPD exacerbation.  Pt's PMH includes COPD, DVT, L tibia fx surgery, L shoulder hemiarthroplasty, colon cancer.      Clinical Impression  Pt admitted with above diagnosis. Pt currently with functional limitations due to the deficits listed below (see PT Problem List). Mrs. [name] appears to be at her baseline level of mobility.  She currently requires mod +2 assist for transfers. Recommending HHPT at d/c.  Case Management has confirmed with son that the pt has 24/7 assist available. Pt will benefit from skilled PT to increase their independence and safety with mobility to allow discharge to the venue listed below.      Follow Up Recommendations Home health PT;Supervision/Assistance - 24 hour    Equipment Recommendations  None recommended by PT    Recommendations for Other Services OT consult     Precautions / Restrictions Precautions Precautions: Fall;Other (comment) Precaution Comments: Monitor O2 Restrictions Weight Bearing Restrictions: No      Mobility  Bed Mobility Overal bed mobility: Needs Assistance Bed Mobility: Supine to Sit     Supine to sit: Mod assist;HOB elevated     General bed mobility comments: HOB elevated and pt uses bedrail to pull up to sitting.  She requires assist to advance LEs to EOB and to elevate trunk. Bed pad used to scoot pt to EOB.  Transfers Overall transfer level: Needs assistance Equipment used: Rolling walker (2 wheeled) Transfers: Sit to/from [employer];Stand Pivot Transfers Sit to Stand: Mod assist;+2 physical assistance;+2 safety/equipment Stand pivot transfers: Mod assist;+2 physical assistance;+2  safety/equipment       General transfer comment: Pt requires mod +2 assist to boost to standing as she demonstrates posterior lean initially.  Cues for hand placement on RW.  Cues to look up but pt says, "I need to look at what I'm doing" when pivoting to chair.  Directional cues provided as pt pivots and assist provided to manage RW and to remain steady.  Pt fatigues with pivot transfer.  Ambulation/Gait             General Gait Details: unable to assess at this time  Stairs            Wheelchair Mobility    Modified Rankin (Stroke Patients Only)       Balance Overall balance assessment: Needs assistance Sitting-balance support: No upper extremity supported;Feet unsupported Sitting balance-Leahy Scale: Poor Sitting balance - Comments: Pt demonstrates posterior bias despite BUEs supported on railing and bed.  She requires intermittent mod assist to correct to upright. Postural control: Posterior lean Standing balance support: Bilateral upper extremity supported;During functional activity Standing balance-Leahy Scale: Poor Standing balance comment: Pt relies on physical assist and RW to maintain balance with static and dynamic activities.                             Pertinent Vitals/Pain Pain Assessment: No/denies pain    Home Living Family/patient expects to be discharged to:: Private residence Living Arrangements: Spouse/significant other Available Help at Discharge: Personal care attendant;Available 24 hours/day Type of Home: Mobile home Home Access: Ramped entrance     Home Layout: One level Home  Equipment: Dan Humphreys - 4 wheels;Wheelchair - manual      Prior Function Level of Independence: Needs assistance   Gait / Transfers Assistance Needed: Pt reports she does not ambulate, only doing stand pivot transfers with personal care attendant.  Needs someone to push her in [facility], cannot self propel.    ADL's / Homemaking Assistance Needed: Needs assist  for sponge bathing, dressing.  Personal care attendant does the cooking, cleaning.        Hand Dominance   Dominant Hand: Left    Extremity/Trunk Assessment   Upper Extremity Assessment Upper Extremity Assessment: RUE deficits/detail;LUE deficits/detail RUE Deficits / Details: Strength grossly 4/5 LUE Deficits / Details: Strength grossly 3+/5.  Pt reports h/o LUE injury with resultant weakness.    Lower Extremity Assessment Lower Extremity Assessment:  (Strength grossly 4+/5 BLEs)    Cervical / Trunk Assessment Cervical / Trunk Assessment: Kyphotic  Communication   Communication: No difficulties  Cognition Arousal/Alertness: Awake/alert Behavior During Therapy: WFL for tasks assessed/performed Overall Cognitive Status: No family/caregiver present to determine baseline cognitive functioning                                 General Comments: Pt not oriented to year and per RN has provided conflicting information when compared to son's information provided.      General Comments General comments (skin integrity, edema, etc.): SpO2 remains at or above 90% on 1L O2 with stand pivot transfer.     Exercises Other Exercises Other Exercises: Seated EOB pt reaching for this PT's hand to promote core activation and promote anterior lean to offset posterior bias.    Assessment/Plan    PT Assessment Patient needs continued PT services  PT Problem List Decreased strength;Decreased activity tolerance;Decreased mobility;Decreased balance;Decreased cognition;Decreased knowledge of use of DME;Decreased safety awareness;Cardiopulmonary status limiting activity       PT Treatment Interventions DME instruction;Gait training;Functional mobility training;Therapeutic activities;Therapeutic exercise;Balance training;Cognitive remediation;Neuromuscular re-education;Patient/family education;Wheelchair mobility training    PT Goals (Current goals can be found in the Care Plan  section)  Acute Rehab PT Goals Patient Stated Goal: to go home PT Goal Formulation: With patient Time For Goal Achievement: 12/22/16 Potential to Achieve Goals: Fair    Frequency Min 2X/week   Barriers to discharge        Co-evaluation               AM-PAC PT "6 Clicks" Daily Activity  Outcome Measure Difficulty turning over in bed (including adjusting bedclothes, sheets and blankets)?: Total Difficulty moving from lying on back to sitting on the side of the bed? : Total Difficulty sitting down on and standing up from a chair with arms (e.g., wheelchair, bedside commode, etc,.)?: Total Help needed moving to and from a bed to chair (including a wheelchair)?: Total Help needed walking in hospital room?: Total Help needed climbing 3-5 steps with a railing? : Total 6 Click Score: 6    End of Session Equipment Utilized During Treatment: Gait belt;Oxygen Activity Tolerance: Patient limited by fatigue Patient left: in chair;with call bell/phone within reach;with chair alarm set Nurse Communication: Mobility status;Other (comment) (SpO2) PT Visit Diagnosis: Muscle weakness (generalized) (M62.81);Unsteadiness on feet (R26.81);Difficulty in walking, not elsewhere classified (R26.2)    Time: 1610-9604 PT Time Calculation (min) (ACUTE ONLY): 18 min   Charges:   PT Evaluation $PT Eval Low Complexity: 1 Procedure     PT G Codes:  Encarnacion Chu PT, DPT 12/08/2016, 2:11 PM

## 2016-12-08 NOTE — Progress Notes (Signed)
ANTICOAGULATION CONSULT NOTE - Initial Consult  Pharmacy Consult for Warfarin Indication: hx of DVT  Allergies  Allergen Reactions  . Nsaids     Pt w/ CKD3  . Aspirin Nausea Only    Abdominal pain    Patient Measurements: Height: 5\' 6"  (167.6 cm) Weight: 200 lb (90.7 kg) IBW/kg (Calculated) : 59.3 Heparin Dosing Weight:    Vital Signs: Temp: 98 F (36.7 C) (06/04 0816) Temp Source: Oral (06/04 0816) BP: 136/51 (06/04 0816) Pulse Rate: 71 (06/04 0816)  Labs:  Recent Labs  12/05/16 1002 12/05/16 1357 12/05/16 1748 12/06/16 0426 12/07/16 0516 12/08/16 0451  HGB  --   --   --  12.3  --   --   HCT  --   --   --  36.7  --   --   PLT  --   --   --  245  --   --   LABPROT  --   --   --  17.6* 19.7* 26.7*  INR  --   --   --  1.43 1.65 2.41  CREATININE  --   --   --  1.35*  --   --   TROPONINI <0.03 <0.03 <0.03  --   --   --     Estimated Creatinine Clearance: 35.2 mL/min (A) (by C-G formula based on SCr of 1.35 mg/dL (H)).   Medical History: Past Medical History:  Diagnosis Date  . COPD (chronic obstructive pulmonary disease) (HCC)   . DVT (deep venous thrombosis) (HCC)   . Hypertension   . Pneumothorax     Medications:  Scheduled:  . amLODipine  2.5 mg Oral Daily  . furosemide  20 mg Oral Daily  . guaiFENesin  600 mg Oral BID  . heparin  5,000 Units Subcutaneous Q8H  . ipratropium-albuterol  3 mL Nebulization Q6H  . methylPREDNISolone (SOLU-MEDROL) injection  60 mg Intravenous Q12H  . senna  1 tablet Oral QHS  . Warfarin - Pharmacist Dosing Inpatient   Does not apply q1800   Infusions:    Assessment: 83  Yo F admitted with Chest pain. Patient on warfarin 5 mg daily at home for hx of DVT.  DATE   INR  DOSE 6/1  1.21  7.5 mg 6/2   1.43  7.5 mg  6/3  1.65  5 mg  6/4  2.41  5mg    Goal of Therapy:  INR 2-3 Monitor platelets by anticoagulation protocol: Yes   Plan:  INR therapeutic, will give Warfarin 5 mg x 1 dose tonight.  Will discontinue  heparin SQ now that INR is therapeutic.  Recheck INR with AM labs  Gardner CandleSheema M Deondra Labrador, PharmD, BCPS Clinical Pharmacist 12/08/2016 9:34 AM

## 2016-12-08 NOTE — Progress Notes (Signed)
SATURATION QUALIFICATIONS: (This note is used to comply with regulatory documentation for home oxygen)  Patient Saturations on Room Air at Rest = 93%  Patient Saturations on Room Air while Ambulating =  Pt unable to ambulate  Patient Saturations on 0 Liters of oxygen while Ambulating   Please briefly explain why patient needs home oxygen:

## 2016-12-08 NOTE — Care Management Note (Signed)
Case Management Note  Patient Details  Name: [name] MRN: [id number] Date of Birth: 1930/02/13  Subjective/Objective:                 Patient placed in observation 12/05/2016 for pneumothorax  and admitted 12/07/2016.  She is from home and has 24/7 round the clock caregivers.  There is question whether patient has home 02.  she says yes - and it came [place] [employer] [place]  Spoke with patient's son Momeyerlifford.  Patient does not have home oxygen.  She had a physical therapy see her "after [place] got  home [facility] [place] [facility] " but not sure of agency or if it was [facility].  Physical therapy had to be put on hold as patient had a fall and hurt her back.  Needed to have a back brace before therapy could continue.  Found that [facility] and physical therapy.  Per Hinton Dyerlifford- would not want to consider any type of facility placement but is agreeable to have home health.  Patient does have chronic COPD.   Action/Plan: Home 02 assessment.  Notified [facility] of admission  Expected Discharge Date:                  Expected Discharge Plan:     In-House Referral:     Discharge planning Services     Post Acute Care Choice:    Choice offered to:     DME Arranged:    DME Agency:     HH Arranged:    HH Agency:     Status of Service:     If discussed at [employer] Meetings, dates discussed:    Additional Comments:  Eber HongGreene, Lenin Kuhnle R, RN 12/08/2016, 10:09 AM

## 2016-12-08 NOTE — Care Management (Signed)
Discussed home 02 assessment with primary nurse.  Patient is not able to ambulate.  In the home, patient is able to exert self when  pivots from bed to chair. Discussed "exertional sats'.  Was informed patient's 02 sat on exertion 93% so will not qualify for home 02.  Updated [facility] and requested order to resume home health nurse and physical therapy.

## 2016-12-08 NOTE — Care Management Important Message (Signed)
Important Message  Patient Details  Name: [name] MRN: [id number] Date of Birth: 04-14-1930   Medicare Important Message Given:  Yes Signed IM notice given   Eber HongGreene, Kimeka Badour R, RN 12/08/2016, 10:27 AM

## 2016-12-08 NOTE — Discharge Instructions (Signed)
Resume diet and activity as before ° ° °

## 2016-12-11 NOTE — Discharge Summary (Signed)
[facility] - [place] [facility]   PATIENT NAME: [name]    MR#:  [id number]  DATE OF BIRTH:  02-Nov-1929  DATE OF ADMISSION:  12/05/2016 ADMITTING PHYSICIAN: Altamese DillingVaibhavkumar Vachhani, MD  DATE OF DISCHARGE: 12/08/2016  5:58 PM  PRIMARY CARE PHYSICIAN: Etheleen NicksMacDonald, Keri, NP   ADMISSION DIAGNOSIS:  Spontaneous pneumothorax [J93.83] Pneumothorax [J93.9] Chest pain [R07.9]  DISCHARGE DIAGNOSIS:  Principal Problem:   Spontaneous pneumothorax Active Problems:   Acute respiratory failure with hypoxia (HCC)   Acute respiratory failure (HCC)   SECONDARY DIAGNOSIS:   Past Medical History:  Diagnosis Date  . COPD (chronic obstructive pulmonary disease) (HCC)   . DVT (deep venous thrombosis) (HCC)   . Hypertension   . Pneumothorax      ADMITTING HISTORY  HISTORY OF PRESENT ILLNESS: [name] Mohrma Jun  is a 83 y.o. female with a known history of DVT, COPD, hypertension, former smoker- was doing fine up until last night. Today early morning at 3 she woke up with left-sided chest pain which was pressure-like and sharp it lasted for a few minutes she took Tylenol tablets to help with that and try to go to sleep. She also had some numbness in her left arm along with the pain. She could sleep for a few hours and again around 6 she woke up with the same pain and numbness in the arm which again lasted for a few minutes. She denies associated cough or shortness of breath. Concerned with this she came to emergency room and initial workup was negative for TSH and it CT scan angiogram of the chest and it showed there is a small pneumothorax. Concerned with this he suggested to admit to medical services for further observation and management.  HOSPITAL COURSE:   * Acute hypoxic respiratory failure due to COPD exacerbation -IV steroids, Scheduled Nebulizers. Change to oral prednisone. - Inhalers. Patient continues to need 2 L oxygen which has been set up by case manager.  Home health set  up.  * Left pneumothorax. Small. Resolved on repeat chest x-ray. Appreciate surgical input. Had some left chest pain due to pneumothorax which has resolved.  * Hypertension. Amlodipine  * History of DVT. On Coumadin.  Stable for discharge home with home health services and home oxygen.   CONSULTS OBTAINED:    DRUG ALLERGIES:   Allergies  Allergen Reactions  . Nsaids     Pt w/ CKD3  . Aspirin Nausea Only    Abdominal pain    DISCHARGE MEDICATIONS:   Discharge Medication List as of 12/08/2016  5:38 PM    START taking these medications   Details  predniSONE (DELTASONE) 20 MG tablet Take 2 tablets (40 mg total) by mouth daily with breakfast., Starting Mon 12/08/2016, Normal      CONTINUE these medications which have NOT CHANGED   Details  acetaminophen (TYLENOL) 650 MG CR tablet Take 650 mg by mouth 2 (two) times daily., Historical Med    ALPRAZolam (XANAX) 0.25 MG tablet Take 1 tablet (0.25 mg total) by mouth 3 (three) times daily as needed for anxiety., Starting Mon 08/11/2016, Print    amLODipine (NORVASC) 2.5 MG tablet Take 2.5 mg by mouth daily., Starting Mon 03/17/2016, Until Tue 03/17/2017, Historical Med    furosemide (LASIX) 20 MG tablet Take 20 mg by mouth daily., Starting Tue 11/25/2016, Until Thu 12/25/2016, Historical Med    guaiFENesin (MUCINEX) 600 MG 12 hr tablet Take 1 tablet (600 mg total) by mouth 2 (two) times daily., Starting  Mon 08/11/2016, Normal    lidocaine (LIDODERM) 5 % Place 1 patch onto the skin daily. Remove & Discard patch within 12 hours or as directed by MD, Starting Sun 08/03/2016, Print    meclizine (ANTIVERT) 12.5 MG tablet Take 12.5 mg by mouth 3 (three) times daily as needed for dizziness or nausea., Starting Fri 05/02/2016, Until Sat 05/02/2017, Historical Med    Multiple Vitamins-Minerals (PRESERVISION AREDS 2+MULTI VIT PO) Take 1 tablet by mouth 2 (two) times daily., Historical Med    senna (SENOKOT) 8.6 MG TABS tablet Take 1 tablet  (8.6 mg total) by mouth at bedtime., Starting Fri 08/01/2016, Print    warfarin (COUMADIN) 4 MG tablet Take 1 tablet (4 mg total) by mouth daily at 6 PM., Starting Mon 08/11/2016, Normal        Today   VITAL SIGNS:  Blood pressure (!) 156/45, pulse 75, temperature 97.5 F (36.4 C), temperature source Oral, resp. rate 20, height 5\' 6"  (1.676 m), weight 90.7 kg (200 lb), SpO2 94 %.  I/O:  No intake or output data in the 24 hours ending 12/11/16 1331  PHYSICAL EXAMINATION:  Physical Exam  GENERAL:  83 y.o.-year-old patient lying in the bed with no acute distress.  LUNGS: Normal breath sounds bilaterally, no wheezing, rales,rhonchi or crepitation. No use of accessory muscles of respiration.  CARDIOVASCULAR: S1, S2 normal. No murmurs, rubs, or gallops.  ABDOMEN: Soft, non-tender, non-distended. Bowel sounds present. No organomegaly or mass.  NEUROLOGIC: Moves all 4 extremities. PSYCHIATRIC: The patient is alert and oriented x 3.  SKIN: No obvious rash, lesion, or ulcer.   DATA REVIEW:   CBC  Recent Labs Lab 12/06/16 0426  WBC 9.5  HGB 12.3  HCT 36.7  PLT 245    Chemistries   Recent Labs Lab 12/06/16 0426  NA 141  K 4.6  CL 104  CO2 30  GLUCOSE 110*  BUN 24*  CREATININE 1.35*  CALCIUM 8.8*    Cardiac Enzymes  Recent Labs Lab 12/05/16 1748  TROPONINI <0.03    Microbiology Results  Results for orders placed or performed during the hospital encounter of 08/05/16  Urine culture     Status: None   Collection Time: 08/05/16 11:27 AM  Result Value Ref Range Status   Specimen Description URINE, RANDOM  Final   Special Requests NONE  Final   Culture   Final    NO GROWTH Performed at [facility], [address], [place], [place] [address]    Report Status 08/06/2016 FINAL  Final  Blood Culture (routine x 2)     Status: None   Collection Time: 08/05/16 11:54 AM  Result Value Ref Range Status   Specimen Description BLOOD LEFT WRIST  Final   Special  Requests   Final    BOTTLES DRAWN AEROBIC AND ANAEROBIC AER ANA   Culture NO GROWTH 5 DAYS  Final   Report Status 08/10/2016 FINAL  Final  Blood Culture (routine x 2)     Status: None   Collection Time: 08/05/16 11:55 AM  Result Value Ref Range Status   Specimen Description BLOOD RIGHT AC  Final   Special Requests   Final    BOTTLES DRAWN AEROBIC AND ANAEROBIC AER ANA   Culture NO GROWTH 5 DAYS  Final   Report Status 08/10/2016 FINAL  Final    RADIOLOGY:  No results found.  Follow up with PCP in 1 week.  Management plans discussed with the patient,  family and they are in agreement.  CODE STATUS:  Code Status History    Date Active Date Inactive Code Status Order ID Comments User Context   12/05/2016 11:28 AM 12/08/2016  9:04 PM Full Code [id number]  Altamese Dilling, MD Inpatient   08/05/2016  8:49 PM 08/11/2016  4:42 PM Full Code [id number]  Katharina Caper, MD Inpatient   08/01/2016  8:27 PM 08/03/2016  4:28 PM DNR [id number]  Enedina Finner, MD ED    Advance Directive Documentation     Most Recent Value  Type of Advance Directive  Healthcare Power of Attorney, Living will  Pre-existing out of facility DNR order (yellow form or pink MOST form)  -  "MOST" Form in Place?  -      TOTAL TIME TAKING CARE OF THIS PATIENT ON DAY OF DISCHARGE: more than 30 minutes.   Milagros Loll R M.D on 12/11/2016 at 1:31 PM  Between 7am to 6pm - Pager - [phone]  After 6pm go to [email or web address] - password [email or web address] [facility]  [facility]  Office  [phone]  CC: Primary care physician; Etheleen Nicks, NP  Note: This dictation was prepared with Dragon dictation along with smaller phrase technology. Any transcriptional errors that result from this process are unintentional.

## 2016-12-15 ENCOUNTER — Ambulatory Visit (INDEPENDENT_AMBULATORY_CARE_PROVIDER_SITE_OTHER): Payer: Medicare Other | Admitting: Podiatry

## 2016-12-15 DIAGNOSIS — L600 Ingrowing nail: Secondary | ICD-10-CM

## 2016-12-15 DIAGNOSIS — L03031 Cellulitis of right toe: Secondary | ICD-10-CM

## 2016-12-15 NOTE — Progress Notes (Signed)
Complaint:  Visit Type: Patient returns to my office for continued preventative foot care services. Complaint: Patient states" my nails have become ingrown on the inside border big and second toe right foot.  She was seen about 4 weeks ago and the pain has continued.  She denies redness or drainage.  She presents for evaluation and treatment.  Podiatric Exam: Vascular: dorsalis pedis and posterior tibial pulses are palpable bilateral. Capillary return is immediate. Temperature gradient is WNL. Skin turgor WNL  Sensorium: Normal Semmes Weinstein monofilament test. Normal tactile sensation bilaterally. Nail Exam: Marked incurvation noted medial border great and second toe right foot. Ulcer Exam: There is no evidence of ulcer or pre-ulcerative changes or infection. Orthopedic Exam: Muscle tone and strength are WNL. No limitations in general ROM. No crepitus or effusions noted. Foot type and digits show no abnormalities. Bony prominences are unremarkable. Skin: No Porokeratosis. No infection or ulcers  Diagnosis:  Ingrown toenail right foot.  Paronychia right foot.  Treatment & Plan Procedures and Treatment: Consent by patient was obtained for treatment procedures. The patient understood the discussion of treatment and procedures well. All questions were answered thoroughly reviewed. Debridement of mycotic and hypertrophic toenails, 1 through 5 bilateral and clearing of subungual debris. No ulceration, no infection noted.  Return Visit-Office Procedure: Patient instructed to return to the office for a follow up visit 6 weeks  for continued preventative foot care services.  Told her to soak at home.    Helane GuntherGregory Romesha Scherer DPM

## 2017-01-19 DIAGNOSIS — I890 Lymphedema, not elsewhere classified: Secondary | ICD-10-CM | POA: Insufficient documentation

## 2017-02-16 ENCOUNTER — Ambulatory Visit: Payer: Medicare Other | Admitting: Podiatry

## 2017-07-21 ENCOUNTER — Encounter: Payer: Medicare Other | Attending: Physician Assistant | Admitting: Physician Assistant

## 2017-07-21 DIAGNOSIS — I890 Lymphedema, not elsewhere classified: Secondary | ICD-10-CM | POA: Diagnosis not present

## 2017-07-21 DIAGNOSIS — J449 Chronic obstructive pulmonary disease, unspecified: Secondary | ICD-10-CM | POA: Insufficient documentation

## 2017-07-21 DIAGNOSIS — Z923 Personal history of irradiation: Secondary | ICD-10-CM | POA: Diagnosis not present

## 2017-07-21 DIAGNOSIS — Z85038 Personal history of other malignant neoplasm of large intestine: Secondary | ICD-10-CM | POA: Insufficient documentation

## 2017-07-21 DIAGNOSIS — Z86718 Personal history of other venous thrombosis and embolism: Secondary | ICD-10-CM | POA: Diagnosis not present

## 2017-07-21 DIAGNOSIS — Z9221 Personal history of antineoplastic chemotherapy: Secondary | ICD-10-CM | POA: Diagnosis not present

## 2017-07-21 DIAGNOSIS — I80203 Phlebitis and thrombophlebitis of unspecified deep vessels of lower extremities, bilateral: Secondary | ICD-10-CM | POA: Diagnosis not present

## 2017-07-21 DIAGNOSIS — L97221 Non-pressure chronic ulcer of left calf limited to breakdown of skin: Secondary | ICD-10-CM | POA: Diagnosis not present

## 2017-07-21 DIAGNOSIS — E785 Hyperlipidemia, unspecified: Secondary | ICD-10-CM | POA: Insufficient documentation

## 2017-07-21 DIAGNOSIS — Z87891 Personal history of nicotine dependence: Secondary | ICD-10-CM | POA: Diagnosis not present

## 2017-07-22 NOTE — Progress Notes (Signed)
ABRA, LINGENFELTER ([id number]) Visit Report for 07/21/2017 Chief Complaint Document Details Patient Name: [name], [name]. Date of Service: 07/21/2017 12:30 PM Medical Record Number: [id number] Patient Account Number: [id number] Date of Birth/Sex: 1930-01-21 (84 y.o. Female) Treating RN: Renne Crigler Primary Care Provider: Etheleen Nicks Other Clinician: Referring Provider: Referral, Self Treating Provider/Extender: Linwood Dibbles, HOYT Weeks in Treatment: 0 Information Obtained from: Patient Chief Complaint Left lateral calf ulcer Electronic Signature(s) Signed: 07/21/2017 4:32:07 PM By: Lenda Kelp PA-C Entered By: Lenda Kelp on 07/21/2017 15:50:55 [name], [name] J. ([id number]) -------------------------------------------------------------------------------- HPI Details Patient Name: [name]. Date of Service: 07/21/2017 12:30 PM Medical Record Number: [id number] Patient Account Number: [id number] Date of Birth/Sex: 1930/01/01 (84 y.o. Female) Treating RN: Renne Crigler Primary Care Provider: Etheleen Nicks Other Clinician: Referring Provider: Referral, Self Treating Provider/Extender: STONE III, HOYT Weeks in Treatment: 0 History of Present Illness Modifying Factors: Other treatment(s) tried include:she has been wearing what sounds like lymphedema pumps for the last 12 years or so HPI Description: 84 year old patient who comes with bilateral lower extremity edema which she's had for at least 15 years and now an ulcerated area on the left lower extremity which she's had for a month. He was recently seen by the PCP who made a diagnosis of venous stasis ulcer of the left calf and referred her to the wound center. He was placed on clindamycin. Past medical history significant for colon cancer, history of DVT, left tibial fracture in 2012 which required surgery, hyperlipidemia and has been on warfarin for several years. She also gives a history of having lymph pumps and she has  been wearing them for about 12 years. She is a former smoker and has quit about 10 years ago. 06/23/2016 -- she has not yet got her venous duplex study done but she did bring her lymphedema pumps and we have checked him out and they seem to be functioning fine 07/03/2016 -- a venous duplex study has not been scheduled for another month and I believe she has an appointment on January 28 07/31/2016 -- the patient continues to have significant lymphedema and she does not have appropriate compression stockings with her at home Readmission: 07/21/17 on evaluation today patient presents for reevaluation concerning a small ulceration she has of the left lateral calf region. This has been present for a couple of weeks prior to initial evaluation here in the clinic. She has not been on any recent antibiotics although she has been treated for a deep vein thrombosis which is almost completely resolved she is actually going to be seeing next month a vascular specialist at [facility]. Up to this point she has been managed mainly by her primary care provider according to patient. She does not have any significant discomfort which is good news. Unfortunately she is having some mild pain though note purulent discharge in the lateral calf wound. No fevers, chills, nausea, or vomiting noted at this time. Electronic Signature(s) Signed: 07/21/2017 4:32:07 PM By: Lenda Kelp PA-C Entered By: Lenda Kelp on 07/21/2017 15:53:10 [name], [name] [name] ([id number]) -------------------------------------------------------------------------------- Physical Exam Details Patient Name: [name], [name]. Date of Service: 07/21/2017 12:30 PM Medical Record Number: [id number] Patient Account Number: [id number] Date of Birth/Sex: [birth year] (84 y.o. Female) Treating RN: Renne Crigler Primary Care Provider: Etheleen Nicks Other Clinician: Referring Provider: Referral, Self Treating Provider/Extender: STONE III, HOYT Weeks in  Treatment: 0 Constitutional patient is hypertensive.. pulse regular and within target range for patient.Marland Kitchen respirations regular, non-labored and within target range  for patient.Marland Kitchen temperature within target range for patient.. Obese and well-hydrated in no acute distress. Eyes conjunctiva clear no eyelid edema noted. pupils equal round and reactive to light and accommodation. Ears, Nose, Mouth, and Throat no gross abnormality of ear auricles or external auditory canals. normal hearing noted during conversation. mucus membranes moist. Respiratory normal breathing without difficulty. clear to auscultation bilaterally. Cardiovascular regular rate and rhythm with normal S1, S2. 2+ dorsalis pedis/posterior tibialis pulses. 2+ pitting edema of the bilateral lower extremities. Gastrointestinal (GI) soft, non-tender, non-distended, +BS. no ventral hernia noted. Musculoskeletal normal gait and posture. no significant deformity or arthritic changes, no loss or range of motion, no clubbing. Psychiatric this patient is able to make decisions and demonstrates good insight into disease process. Alert and Oriented x 3. pleasant and cooperative. Notes Patient's wound bed appears to be a partial thickness injury almost this appears to be a blister that has at this point in time ruptured. There does not appear to be any evidence of infection there is no purulent discharge at this point in time. No sharp debridement was necessary. Electronic Signature(s) Signed: 07/21/2017 4:32:07 PM By: Lenda Kelp PA-C Entered By: Lenda Kelp on 07/21/2017 15:53:57 [name], [name] ([id number]) -------------------------------------------------------------------------------- Physician Orders Details Patient Name: [name], [name]. Date of Service: 07/21/2017 12:30 PM Medical Record Number: [id number] Patient Account Number: [id number] Date of Birth/Sex: 03/02/1930 (84 y.o. Female) Treating RN: Renne Crigler Primary  Care Provider: Etheleen Nicks Other Clinician: Referring Provider: Referral, Self Treating Provider/Extender: Linwood Dibbles, HOYT Weeks in Treatment: 0 Verbal / Phone Orders: No Diagnosis Coding Wound Cleansing Wound #3 Left,Proximal,Lateral Lower Leg o Clean wound with Normal Saline. Anesthetic (add to Medication List) Wound #3 Left,Proximal,Lateral Lower Leg o Topical Lidocaine 4% cream applied to wound bed prior to debridement (In Clinic Only). Skin Barriers/Peri-Wound Care Wound #3 Left,Proximal,Lateral Lower Leg o Moisturizing lotion Primary Wound Dressing o Other: - silvercell Secondary Dressing Wound #3 Left,Proximal,Lateral Lower Leg o Other - xtrasorb Dressing Change Frequency Wound #3 Left,Proximal,Lateral Lower Leg o Three times weekly - once to be done in the clinic Follow-up Appointments Wound #3 Left,Proximal,Lateral Lower Leg o Return Appointment in 1 week. Edema Control o 3 Layer Compression System - Left Lower Extremity Home Health Wound #3 Left,Proximal,Lateral Lower Leg o Initiate Home Health for Skilled Nursing o Home Health Nurse may visit PRN to address patientos wound care needs. o FACE TO FACE ENCOUNTER: MEDICARE and MEDICAID PATIENTS: I certify that this patient is under my care and that I had a face-to-face encounter that meets the physician face-to-face encounter requirements with this patient on this date. The encounter with the patient was in whole or in part for the following MEDICAL CONDITION: (primary reason for Home Healthcare) MEDICAL NECESSITY: I certify, that based on my findings, NURSING services are a medically necessary home health service. HOME BOUND STATUS: I certify that my clinical findings support that this patient is homebound (i.e., Due to illness or injury, pt requires aid of supportive devices such as crutches, cane, wheelchairs, walkers, the use of special transportation or the assistance of another person to  leave their place of residence. There is a normal inability to leave the home [place], [place] J. ([id number]) and doing so requires considerable and taxing effort. Other absences are for medical reasons / religious services and are infrequent or of short duration when for other reasons). o If current dressing causes regression in wound condition, may D/C ordered dressing product/s and apply Normal Saline Moist Dressing  daily until next Wound Healing Center / Other MD appointment. Notify Wound Healing Center of regression in wound condition at [phone]. o Please direct any NON-WOUND related issues/requests for orders to patient's Primary Care Physician Notes anchor with unna boot Electronic Signature(s) Signed: 07/21/2017 4:11:02 PM By: Renne Crigler Signed: 07/21/2017 4:32:07 PM By: Lenda Kelp PA-C Entered By: Renne Crigler on 07/21/2017 13:53:29 Rase, Monzerat [name] ([id number]) -------------------------------------------------------------------------------- Problem List Details Patient Name: Camano, [name]. Date of Service: 07/21/2017 12:30 PM Medical Record Number: [id number] Patient Account Number: [id number] Date of Birth/Sex: Feb 24, 1930 (84 y.o. Female) Treating RN: Renne Crigler Primary Care Provider: Etheleen Nicks Other Clinician: Referring Provider: Referral, Self Treating Provider/Extender: Linwood Dibbles, HOYT Weeks in Treatment: 0 Active Problems ICD-10 Encounter Code Description Active Date Diagnosis I89.0 Lymphedema, not elsewhere classified 07/21/2017 Yes L97.221 Non-pressure chronic ulcer of left calf limited to breakdown of skin 07/21/2017 Yes I80.203 Phlebitis and thrombophlebitis of unspecified deep vessels of lower 07/21/2017 Yes extremities, bilateral Inactive Problems Resolved Problems Electronic Signature(s) Signed: 07/21/2017 4:32:07 PM By: Lenda Kelp PA-C Entered By: Lenda Kelp on 07/21/2017 15:50:33 [name], [name] J.  ([id number]) -------------------------------------------------------------------------------- Progress Note Details Patient Name: [name]. Date of Service: 07/21/2017 12:30 PM Medical Record Number: [id number] Patient Account Number: [id number] Date of Birth/Sex: March 10, 1930 (84 y.o. Female) Treating RN: Renne Crigler Primary Care Provider: Etheleen Nicks Other Clinician: Referring Provider: Referral, Self Treating Provider/Extender: Linwood Dibbles, HOYT Weeks in Treatment: 0 Subjective Chief Complaint Information obtained from Patient Left lateral calf ulcer History of Present Illness (HPI) The following HPI elements were documented for the patient's wound: Modifying Factors: Other treatment(s) tried include:she has been wearing what sounds like lymphedema pumps for the last 12 years or so 84 year old patient who comes with bilateral lower extremity edema which she's had for at least 15 years and now an ulcerated area on the left lower extremity which she's had for a month. He was recently seen by the PCP who made a diagnosis of venous stasis ulcer of the left calf and referred her to the wound center. He was placed on clindamycin. Past medical history significant for colon cancer, history of DVT, left tibial fracture in 2012 which required surgery, hyperlipidemia and has been on warfarin for several years. She also gives a history of having lymph pumps and she has been wearing them for about 12 years. She is a former smoker and has quit about 10 years ago. 06/23/2016 -- she has not yet got her venous duplex study done but she did bring her lymphedema pumps and we have checked him out and they seem to be functioning fine 07/03/2016 -- a venous duplex study has not been scheduled for another month and I believe she has an appointment on January 28 07/31/2016 -- the patient continues to have significant lymphedema and she does not have appropriate compression stockings with her at  home Readmission: 07/21/17 on evaluation today patient presents for reevaluation concerning a small ulceration she has of the left lateral calf region. This has been present for a couple of weeks prior to initial evaluation here in the clinic. She has not been on any recent antibiotics although she has been treated for a deep vein thrombosis which is almost completely resolved she is actually going to be seeing next month a vascular specialist at [facility]. Up to this point she has been managed mainly by her primary care provider according to patient. She does not have any significant discomfort which is good news. Unfortunately she  is having some mild pain though note purulent discharge in the lateral calf wound. No fevers, chills, nausea, or vomiting noted at this time. Wound History Patient presents with 1 open wound that has been present for approximately 2 months. Laboratory tests have not been performed in the last month. Patient reportedly has not tested positive for an antibiotic resistant organism. Patient reportedly has not tested positive for osteomyelitis. Patient reportedly has not had testing performed to evaluate circulation in the legs. Patient History Information obtained from Patient. Allergies aspirin [name] CoombsSMITH, Kryslyn J. ([id number]) Family History Cancer - Child, Diabetes - Child,Maternal Grandparents, Heart Disease - Maternal Grandparents, Kidney Disease - Maternal Grandparents, No family history of Hypertension, Lung Disease, Seizures, Stroke, Thyroid Problems, Tuberculosis. Social History Former smoker - 20 + years ago, Marital Status - Married, Alcohol Use - Never, Drug Use - No History, Caffeine Use - Never. Medical History Respiratory Patient has history of Chronic Obstructive Pulmonary Disease (COPD) Hospitalization/Surgery History - 07/07/2005, [facility], Colon cancer. Review of Systems (ROS) Eyes Complains or has symptoms of Glasses / Contacts -  glasses. Hematologic/Lymphatic Complains or has symptoms of Bleeding / Clotting Disorders - DVT 2 months ago- left leg. Respiratory Complains or has symptoms of Shortness of Breath. Cardiovascular Complains or has symptoms of LE edema. Gastrointestinal Denies complaints or symptoms of Frequent diarrhea, Nausea, Vomiting. Endocrine Denies complaints or symptoms of Hepatitis, Thyroid disease, Polydypsia (Excessive Thirst). Genitourinary Complains or has symptoms of Kidney failure/ Dialysis - decreased kidney function. Immunological Complains or has symptoms of Itching - lower extremities and hips. Denies complaints or symptoms of Hives. Integumentary (Skin) Complains or has symptoms of Wounds, Bleeding or bruising tendency. Denies complaints or symptoms of Breakdown, Swelling. Musculoskeletal Complains or has symptoms of Muscle Weakness. Objective Constitutional patient is hypertensive.. pulse regular and within target range for patient.Marland Kitchen. respirations regular, non-labored and within target range for patient.Marland Kitchen. temperature within target range for patient.. Obese and well-hydrated in no acute distress. Vitals Time Taken: 12:54 PM, Height: 66 in, Source: Stated, Temperature: 98.3 F, Pulse: 82 bpm, Respiratory Rate: 22 breaths/min, Blood Pressure: 158/59 mmHg. Eyes conjunctiva clear no eyelid edema noted. pupils equal round and reactive to light and accommodation. Hazel, Anastasya J. ([id number]) Ears, Nose, Mouth, and Throat no gross abnormality of ear auricles or external auditory canals. normal hearing noted during conversation. mucus membranes moist. Respiratory normal breathing without difficulty. clear to auscultation bilaterally. Cardiovascular regular rate and rhythm with normal S1, S2. 2+ dorsalis pedis/posterior tibialis pulses. 2+ pitting edema of the bilateral lower extremities. Gastrointestinal (GI) soft, non-tender, non-distended, +BS. no ventral hernia  noted. Musculoskeletal normal gait and posture. no significant deformity or arthritic changes, no loss or range of motion, no clubbing. Psychiatric this patient is able to make decisions and demonstrates good insight into disease process. Alert and Oriented x 3. pleasant and cooperative. General Notes: Patient's wound bed appears to be a partial thickness injury almost this appears to be a blister that has at this point in time ruptured. There does not appear to be any evidence of infection there is no purulent discharge at this point in time. No sharp debridement was necessary. Integumentary (Hair, Skin) Wound #3 status is Open. Original cause of wound was Gradually Appeared. The wound is located on the Left,Proximal,Lateral Lower Leg. The wound measures 0.8cm length x 0.6cm width x 0.1cm depth; 0.377cm^2 area and 0.038cm^3 volume. There is Fat Layer (Subcutaneous Tissue) Exposed exposed. There is no tunneling or undermining noted. There is a large amount of serous  drainage noted. The wound margin is flat and intact. There is small (1-33%) pink granulation within the wound bed. There is a large (67-100%) amount of necrotic tissue within the wound bed including Adherent Slough. The periwound skin appearance did not exhibit: Callus, Crepitus, Excoriation, Induration, Rash, Scarring, Dry/Scaly, Maceration, Atrophie Blanche, Cyanosis, Ecchymosis, Hemosiderin Staining, Mottled, Pallor, Rubor, Erythema. Assessment Active Problems ICD-10 I89.0 - Lymphedema, not elsewhere classified L97.221 - Non-pressure chronic ulcer of left calf limited to breakdown of skin I80.203 - Phlebitis and thrombophlebitis of unspecified deep vessels of lower extremities, bilateral Plan Wound Cleansing: Wound #3 Left,Proximal,Lateral Lower Leg: Clean wound with Normal Saline. Anesthetic (add to Medication List): DHARMA, PARE ([id number]) Wound #3 Left,Proximal,Lateral Lower Leg: Topical Lidocaine 4% cream applied  to wound bed prior to debridement (In Clinic Only). Skin Barriers/Peri-Wound Care: Wound #3 Left,Proximal,Lateral Lower Leg: Moisturizing lotion Primary Wound Dressing: Other: - silvercell Secondary Dressing: Wound #3 Left,Proximal,Lateral Lower Leg: Other - xtrasorb Dressing Change Frequency: Wound #3 Left,Proximal,Lateral Lower Leg: Three times weekly - once to be done in the clinic Follow-up Appointments: Wound #3 Left,Proximal,Lateral Lower Leg: Return Appointment in 1 week. Edema Control: 3 Layer Compression System - Left Lower Extremity Home Health: Wound #3 Left,Proximal,Lateral Lower Leg: Initiate Home Health for Skilled Nursing Home Health Nurse may visit PRN to address patient s wound care needs. FACE TO FACE ENCOUNTER: MEDICARE and MEDICAID PATIENTS: I certify that this patient is under my care and that I had a face-to-face encounter that meets the physician face-to-face encounter requirements with this patient on this date. The encounter with the patient was in whole or in part for the following MEDICAL CONDITION: (primary reason for Home Healthcare) MEDICAL NECESSITY: I certify, that based on my findings, NURSING services are a medically necessary home health service. HOME BOUND STATUS: I certify that my clinical findings support that this patient is homebound (i.e., Due to illness or injury, pt requires aid of supportive devices such as crutches, cane, wheelchairs, walkers, the use of special transportation or the assistance of another person to leave their place of residence. There is a normal inability to leave the home and doing so requires considerable and taxing effort. Other absences are for medical reasons / religious services and are infrequent or of short duration when for other reasons). If current dressing causes regression in wound condition, may D/C ordered dressing product/s and apply Normal Saline Moist Dressing daily until next Wound Healing Center / Other  MD appointment. Notify Wound Healing Center of regression in wound condition at [phone]. Please direct any NON-WOUND related issues/requests for orders to patient's Primary Care Physician General Notes: anchor with unna boot At this point based on the wound what I'm seeing on physical exam I think the ideal thing is that we need to get her swelling under control and to this end I'm going to recommend that we initiate a three layer compression wrap as of today. Patient is in agreement with this plan put. Subsequently will also utilize a silver alginate dressing to help with the drainage at the wound site. All of this sounds appropriate to the patient and she is pleased with the idea. We will set up home health to come out to change the wrap twice a week and we will perform the third wrap change here in the office. Please see above for specific wound care orders. We will see patient for re-evaluation in 1 week(s) here in the clinic. If anything worsens or changes patient will contact our office  for additional recommendations. Electronic Signature(s) Signed: 07/21/2017 4:32:07 PM By: Lenda Kelp PA-C Entered By: Lenda Kelp on 07/21/2017 15:56:02 [name], [name] J. ([id number]) [name], [name] ([id number]) -------------------------------------------------------------------------------- ROS/PFSH Details Patient Name: [name], [name]. Date of Service: 07/21/2017 12:30 PM Medical Record Number: [id number] Patient Account Number: [id number] Date of Birth/Sex: 02/28/1930 (84 y.o. Female) Treating RN: Renne Crigler Primary Care Provider: Etheleen Nicks Other Clinician: Referring Provider: Referral, Self Treating Provider/Extender: Linwood Dibbles, HOYT Weeks in Treatment: 0 Information Obtained From Patient Wound History Do you currently have one or more open woundso Yes How many open wounds do you currently haveo 1 Approximately how long have you had your woundso 2 months Has your wound(s)  ever healed and then re-openedo No Have you had any lab work done in the past montho No Have you tested positive for an antibiotic resistant organism (MRSA, VRE)o No Have you tested positive for osteomyelitis (bone infection)o No Have you had any tests for circulation on your legso No Eyes Complaints and Symptoms: Positive for: Glasses / Contacts - glasses Medical History: Negative for: Cataracts; Glaucoma; Optic Neuritis Hematologic/Lymphatic Complaints and Symptoms: Positive for: Bleeding / Clotting Disorders - DVT 2 months ago- left leg Medical History: Positive for: Lymphedema Negative for: Anemia; Hemophilia; Human Immunodeficiency Virus; Sickle Cell Disease Respiratory Complaints and Symptoms: Positive for: Shortness of Breath Medical History: Positive for: Asthma - controlled; Chronic Obstructive Pulmonary Disease (COPD) Negative for: Aspiration; Pneumothorax; Sleep Apnea; Tuberculosis Cardiovascular Complaints and Symptoms: Positive for: LE edema Medical History: Positive for: Deep Vein Thrombosis - lower extremties bilaterally Negative for: Angina; Arrhythmia; Congestive Heart Failure; Coronary Artery Disease; Hypertension; Hypotension; Myocardial Infarction; Peripheral Arterial Disease; Peripheral Venous Disease; Phlebitis; Vasculitis Gastrointestinal [name], [name] J. ([id number]) Complaints and Symptoms: Negative for: Frequent diarrhea; Nausea; Vomiting Medical History: Negative for: Cirrhosis ; Colitis; Crohnos; Hepatitis A; Hepatitis B Endocrine Complaints and Symptoms: Negative for: Hepatitis; Thyroid disease; Polydypsia (Excessive Thirst) Medical History: Negative for: Type I Diabetes; Type II Diabetes Genitourinary Complaints and Symptoms: Positive for: Kidney failure/ Dialysis - decreased kidney function Medical History: Negative for: End Stage Renal Disease Immunological Complaints and Symptoms: Positive for: Itching - lower extremities and hips Negative  for: Hives Medical History: Negative for: Lupus Erythematosus; Raynaudos; Scleroderma Integumentary (Skin) Complaints and Symptoms: Positive for: Wounds; Bleeding or bruising tendency Negative for: Breakdown; Swelling Medical History: Negative for: History of Burn; History of pressure wounds Musculoskeletal Complaints and Symptoms: Positive for: Muscle Weakness Medical History: Negative for: Gout; Rheumatoid Arthritis; Osteoarthritis; Osteomyelitis Ear/Nose/Mouth/Throat Medical History: Negative for: Chronic sinus problems/congestion; Middle ear problems Neurologic Medical History: Negative for: Dementia; Neuropathy; Quadriplegia; Paraplegia; Seizure Disorder Oncologic [name], [name] ([id number]) Medical History: Positive for: Received Chemotherapy - 2007; Received Radiation - 2007 Psychiatric Medical History: Negative for: Anorexia/bulimia; Confinement Anxiety Immunizations Pneumococcal Vaccine: Received Pneumococcal Vaccination: Yes Implantable Devices Hospitalization / Surgery History Name of Hospital Purpose of Hospitalization/Surgery Date [facility] Colon cancer 07/07/2005 Family and Social History Cancer: Yes - Child; Diabetes: Yes - Child,Maternal Grandparents; Heart Disease: Yes - Maternal Grandparents; Hypertension: No; Kidney Disease: Yes - Maternal Grandparents; Lung Disease: No; Seizures: No; Stroke: No; Thyroid Problems: No; Tuberculosis: No; Former smoker - 20 + years ago; Marital Status - Married; Alcohol Use: Never; Drug Use: No History; Caffeine Use: Never; Financial Concerns: No; Food, Clothing or Shelter Needs: No; Support System Lacking: No; Transportation Concerns: No; Advanced Directives: Yes (Not Provided); Patient does not want information on Advanced Directives; Living Will: Yes (Not Provided) Electronic Signature(s) Signed: 07/21/2017 4:11:02 PM  By: Renne Crigler Signed: 07/21/2017 4:32:07 PM By: Lenda Kelp PA-C Entered By: Renne Crigler on  07/21/2017 13:01:25 Caris, [name] ([id number]) -------------------------------------------------------------------------------- SuperBill Details Patient Name: [place], [name] Date of Service: 07/21/2017 Medical Record Number: [id number] Patient Account Number: [id number] Date of Birth/Sex: 07/12/1929 (84 y.o. Female) Treating RN: Renne Crigler Primary Care Provider: Etheleen Nicks Other Clinician: Referring Provider: Referral, Self Treating Provider/Extender: Linwood Dibbles, HOYT Weeks in Treatment: 0 Diagnosis Coding ICD-10 Codes Code Description I89.0 Lymphedema, not elsewhere classified L97.221 Non-pressure chronic ulcer of left calf limited to breakdown of skin I80.203 Phlebitis and thrombophlebitis of unspecified deep vessels of lower extremities, bilateral Facility Procedures CPT4 Code: 78295621 Description: 99213 - WOUND CARE VISIT-LEV 3 EST PT Modifier: Quantity: 1 Physician Procedures CPT4 Code Description: [id number] 46962 - WC PHYS LEVEL 3 - EST PT ICD-10 Diagnosis Description I89.0 Lymphedema, not elsewhere classified L97.221 Non-pressure chronic ulcer of left calf limited to breakdown of I80.203 Phlebitis and thrombophlebitis of  unspecified deep vessels of l Modifier: skin ower extremities, Quantity: 1 bilateral Electronic Signature(s) Signed: 07/21/2017 4:32:07 PM By: Lenda Kelp PA-C Entered By: Lenda Kelp on 07/21/2017 15:56:27

## 2017-07-22 NOTE — Progress Notes (Signed)
[place], [place] ([id number]) Visit Report for 07/21/2017 Allergy List Details Patient Name: [name], [name]. Date of Service: 07/21/2017 12:30 PM Medical Record Number: [id number] Patient Account Number: [id number] Date of Birth/Sex: 01/28/1930 (84 y.o. Female) Treating RN: Renne Crigler Primary Care Zameria Vogl: [name] Other Clinician: Referring Katie Faraone: Referral, Self Treating Claudio Mondry/Extender: STONE III, HOYT Weeks in Treatment: 0 Allergies Active Allergies aspirin Allergy Notes Electronic Signature(s) Signed: 07/21/2017 4:11:02 PM By: Renne Crigler Entered By: Renne Crigler on 07/21/2017 13:17:47 [name] ([id number]) -------------------------------------------------------------------------------- Arrival Information Details Patient Name: [name]. Date of Service: 07/21/2017 12:30 PM Medical Record Number: [id number] Patient Account Number: [id number] Date of Birth/Sex: 30-Jan-1930 (84 y.o. Female) Treating RN: Renne Crigler Primary Care Magda Muise: [name] Other Clinician: Referring Marquise Wicke: Referral, Self Treating Emmilia Sowder/Extender: Linwood Dibbles, HOYT Weeks in Treatment: 0 Visit Information Patient Arrived: Wheel Chair Arrival Time: 12:48 Accompanied By: son Transfer Assistance: EasyPivot Patient Lift Patient Identification Verified: Yes Secondary Verification Process Yes Completed: History Since Last Visit Electronic Signature(s) Signed: 07/21/2017 4:11:02 PM By: Renne Crigler Entered By: Renne Crigler on 07/21/2017 12:54:42 [name] ([id number]) -------------------------------------------------------------------------------- Clinic Level of Care Assessment Details Patient Name: [name]. Date of Service: 07/21/2017 12:30 PM Medical Record Number: [id number] Patient Account Number: [id number] Date of Birth/Sex: 1930/04/11 (84 y.o. Female) Treating RN: Renne Crigler Primary Care Dicy Smigel: [name] Other  Clinician: Referring Theola Cuellar: Referral, Self Treating Inaki Vantine/Extender: Linwood Dibbles, HOYT Weeks in Treatment: 0 Clinic Level of Care Assessment Items TOOL 2 Quantity Score X - Use when only an EandM is performed on the INITIAL visit 1 0 ASSESSMENTS - Nursing Assessment / Reassessment X - General Physical Exam (combine w/ comprehensive assessment (listed just below) when 1 20 performed on new pt. evals) X- 1 25 Comprehensive Assessment (HX, ROS, Risk Assessments, Wounds Hx, etc.) ASSESSMENTS - Wound and Skin Assessment / Reassessment X - Simple Wound Assessment / Reassessment - one wound 1 5 []  - 0 Complex Wound Assessment / Reassessment - multiple wounds []  - 0 Dermatologic / Skin Assessment (not related to wound area) ASSESSMENTS - Ostomy and/or Continence Assessment and Care []  - Incontinence Assessment and Management 0 []  - 0 Ostomy Care Assessment and Management (repouching, etc.) PROCESS - Coordination of Care X - Simple Patient / Family Education for ongoing care 1 15 []  - 0 Complex (extensive) Patient / Family Education for ongoing care []  - 0 Staff obtains Chiropractor, Records, Test Results / Process Orders []  - 0 Staff telephones HHA, Nursing Homes / Clarify orders / etc []  - 0 Routine Transfer to another Facility (non-emergent condition) []  - 0 Routine Hospital Admission (non-emergent condition) []  - 0 New Admissions / Manufacturing engineer / Ordering NPWT, Apligraf, etc. []  - 0 Emergency Hospital Admission (emergent condition) X- 1 10 Simple Discharge Coordination []  - 0 Complex (extensive) Discharge Coordination PROCESS - Special Needs []  - Pediatric / Minor Patient Management 0 []  - 0 Isolation Patient Management Almendarez, Jennessa J. ([id number]) []  - 0 Hearing / Language / Visual special needs []  - 0 Assessment of Community assistance (transportation, D/C planning, etc.) []  - 0 Additional assistance / Altered mentation []  - 0 Support Surface(s) Assessment  (bed, cushion, seat, etc.) INTERVENTIONS - Wound Cleansing / Measurement X - Wound Imaging (photographs - any number of wounds) 1 5 []  - 0 Wound Tracing (instead of photographs) X- 1 5 Simple Wound Measurement - one wound []  - 0 Complex Wound Measurement - multiple wounds X- 1 5 Simple Wound Cleansing - one  wound []  - 0 Complex Wound Cleansing - multiple wounds INTERVENTIONS - Wound Dressings X - Small Wound Dressing one or multiple wounds 1 10 []  - 0 Medium Wound Dressing one or multiple wounds []  - 0 Large Wound Dressing one or multiple wounds []  - 0 Application of Medications - injection INTERVENTIONS - Miscellaneous []  - External ear exam 0 []  - 0 Specimen Collection (cultures, biopsies, blood, body fluids, etc.) []  - 0 Specimen(s) / Culture(s) sent or taken to Lab for analysis []  - 0 Patient Transfer (multiple staff / Nurse, adult / Similar devices) []  - 0 Simple Staple / Suture removal (25 or less) []  - 0 Complex Staple / Suture removal (26 or more) []  - 0 Hypo / Hyperglycemic Management (close monitor of Blood Glucose) X- 1 15 Ankle / Brachial Index (ABI) - do not check if billed separately Has the patient been seen at the hospital within the last three years: Yes Total Score: 115 Level Of Care: New/Established - Level 3 Electronic Signature(s) Signed: 07/21/2017 4:11:02 PM By: Renne Crigler Entered By: Renne Crigler on 07/21/2017 13:54:35 [name] ([id number]) -------------------------------------------------------------------------------- Encounter Discharge Information Details Patient Name: [name]. Date of Service: 07/21/2017 12:30 PM Medical Record Number: [id number] Patient Account Number: [id number] Date of Birth/Sex: Jan 19, 1930 (84 y.o. Female) Treating RN: Renne Crigler Primary Care Kashlynn Kundert: [name] Other Clinician: Referring Tabytha Gradillas: Referral, Self Treating Zuriah Bordas/Extender: Linwood Dibbles, HOYT Weeks in Treatment:  0 Encounter Discharge Information Items Discharge Pain Level: 0 Discharge Condition: Stable Ambulatory Status: Wheelchair Discharge Destination: Home Transportation: Private Auto Accompanied By: son Schedule Follow-up Appointment: Yes Medication Reconciliation completed and No provided to Patient/Care Stacy Sailer: Patient Clinical Summary of Care: Declined Electronic Signature(s) Signed: 07/21/2017 4:11:02 PM By: Renne Crigler Entered By: Renne Crigler on 07/21/2017 13:56:38 Welles, Shawntelle J. ([id number]) -------------------------------------------------------------------------------- Lower Extremity Assessment Details Patient Name: [place], Lidia Gordon. Date of Service: 07/21/2017 12:30 PM Medical Record Number: [id number] Patient Account Number: [id number] Date of Birth/Sex: 1930-06-30 (83 y.o. Female) Treating RN: Renne Crigler Primary Care Indy Prestwood: [name] Other Clinician: Referring Krishna Dancel: Referral, Self Treating Larcenia Holaday/Extender: Linwood Dibbles, HOYT Weeks in Treatment: 0 Edema Assessment Assessed: [Left: No] [Right: No] Edema: [Left: Yes] [Right: Yes] Calf Left: Right: Point of Measurement: 35 cm From Medial Instep 50.6 cm 49.3 cm Ankle Left: Right: Point of Measurement: 12 cm From Medial Instep 29 cm 28.9 cm Vascular Assessment Claudication: Claudication Assessment [Left:None] [Right:None] Pulses: Posterior Tibial Blood Pressure: Brachial: [Left:82] Dorsalis Pedis: 70 [Left:Dorsalis Pedis:] Ankle: Posterior Tibial: 82 [Left:Posterior Tibial: 1.00] Toe Nail Assessment Left: Right: Thick: Yes Discolored: Yes Deformed: Yes Improper Length and Hygiene: Yes Electronic Signature(s) Signed: 07/21/2017 4:11:02 PM By: Renne Crigler Entered By: Renne Crigler on 07/21/2017 13:17:34 Shawler, Colin J. ([id number]) -------------------------------------------------------------------------------- Multi Wound Chart Details Patient Name: [place], Lidia Gordon. Date of  Service: 07/21/2017 12:30 PM Medical Record Number: [id number] Patient Account Number: [id number] Date of Birth/Sex: [birth year] (84 y.o. Female) Treating RN: Renne Crigler Primary Care Kerensa Nicklas: [name] Other Clinician: Referring Rebekka Lobello: Referral, Self Treating Carnelia Oscar/Extender: STONE III, HOYT Weeks in Treatment: 0 Vital Signs Height(in): 66 Pulse(bpm): 82 Weight(lbs): Blood Pressure(mmHg): 158/59 Body Mass Index(BMI): Temperature(F): 98.3 Respiratory Rate 22 (breaths/min): Photos: [3:No Photos] [N/A:N/A] Wound Location: [3:Left Lower Leg - Lateral, Proximal] [N/A:N/A] Wounding Event: [3:Gradually Appeared] [N/A:N/A] Primary Etiology: [3:Lymphedema] [N/A:N/A] Comorbid History: [3:Lymphedema, Asthma, Chronic Obstructive Pulmonary Disease (COPD), Deep Vein Thrombosis, Received Chemotherapy, Received Radiation] [N/A:N/A] Date Acquired: [3:05/21/2017] [N/A:N/A] Weeks of Treatment: [3:0] [N/A:N/A] Wound Status: [3:Open] [N/A:N/A] Measurements L x W  x D [3:0.8x0.6x0.1] [N/A:N/A] (cm) Area (cm) : [3:0.377] [N/A:N/A] Volume (cm) : [3:0.038] [N/A:N/A] % Reduction in Area: [3:0.00%] [N/A:N/A] % Reduction in Volume: [3:0.00%] [N/A:N/A] Classification: [3:Partial Thickness] [N/A:N/A] Exudate Amount: [3:Large] [N/A:N/A] Exudate Type: [3:Serous] [N/A:N/A] Exudate Color: [3:amber] [N/A:N/A] Wound Margin: [3:Flat and Intact] [N/A:N/A] Granulation Amount: [3:Small (1-33%)] [N/A:N/A] Granulation Quality: [3:Pink] [N/A:N/A] Necrotic Amount: [3:Large (67-100%)] [N/A:N/A] Exposed Structures: [3:Fat Layer (Subcutaneous Tissue) Exposed: Yes Fascia: No Tendon: No Muscle: No Joint: No Bone: No] [N/A:N/A] Epithelialization: [3:None] [N/A:N/A] Periwound Skin Texture: [N/A:N/A] Excoriation: No Induration: No Callus: No Crepitus: No Rash: No Scarring: No Periwound Skin Moisture: Maceration: No N/A N/A Dry/Scaly: No Periwound Skin Color: Atrophie Blanche: No N/A  N/A Cyanosis: No Ecchymosis: No Erythema: No Hemosiderin Staining: No Mottled: No Pallor: No Rubor: No Tenderness on Palpation: No N/A N/A Wound Preparation: Ulcer Cleansing: N/A N/A Rinsed/Irrigated with Saline Topical Anesthetic Applied: Other: lidocaine 4% Treatment Notes Electronic Signature(s) Signed: 07/21/2017 4:11:02 PM By: Renne Crigler Entered By: Renne Crigler on 07/21/2017 13:19:38 Caffey, Alisabeth [name] ([id number]) -------------------------------------------------------------------------------- Multi-Disciplinary Care Plan Details Patient Name: [place], [name] Date of Service: 07/21/2017 12:30 PM Medical Record Number: [id number] Patient Account Number: [id number] Date of Birth/Sex: [birth year] (84 y.o. Female) Treating RN: Renne Crigler Primary Care Camrie Stock: [name] Other Clinician: Referring Aki Burdin: Referral, Self Treating Aleayah Chico/Extender: Linwood Dibbles, HOYT Weeks in Treatment: 0 Active Inactive ` Orientation to the Wound Care Program Nursing Diagnoses: Knowledge deficit related to the wound healing center program Goals: Patient/caregiver will verbalize understanding of the Wound Healing Center Program Date Initiated: 07/21/2017 Target Resolution Date: 08/11/2017 Goal Status: Active Interventions: Provide education on orientation to the wound center Notes: ` Wound/Skin Impairment Nursing Diagnoses: Impaired tissue integrity Knowledge deficit related to ulceration/compromised skin integrity Goals: Patient/caregiver will verbalize understanding of skin care regimen Date Initiated: 07/21/2017 Target Resolution Date: 08/11/2017 Goal Status: Active Ulcer/skin breakdown will have a volume reduction of 30% by week 4 Date Initiated: 07/21/2017 Target Resolution Date: 08/11/2017 Goal Status: Active Interventions: Assess patient/caregiver ability to obtain necessary supplies Assess patient/caregiver ability to perform ulcer/skin care regimen upon admission  and as needed Assess ulceration(s) every visit Treatment Activities: Skin care regimen initiated : 07/21/2017 Notes: Electronic Signature(s) Signed: 07/21/2017 4:11:02 PM By: Sharlotte Alamo, Nevaeha J. ([id number]) Entered By: Renne Crigler on 07/21/2017 13:19:31 Dinse, Elliana J. ([id number]) -------------------------------------------------------------------------------- Pain Assessment Details Patient Name: [place], Lidia Gordon. Date of Service: 07/21/2017 12:30 PM Medical Record Number: [id number] Patient Account Number: [id number] Date of Birth/Sex: 1929/12/14 (84 y.o. Female) Treating RN: Renne Crigler Primary Care Corisa Montini: [name] Other Clinician: Referring Kimball Manske: Referral, Self Treating Rutger Salton/Extender: Linwood Dibbles, HOYT Weeks in Treatment: 0 Active Problems Location of Pain Severity and Description of Pain Patient Has Paino No Site Locations Pain Management and Medication Current Pain Management: Electronic Signature(s) Signed: 07/21/2017 4:11:02 PM By: Renne Crigler Entered By: Renne Crigler on 07/21/2017 12:54:48 Perra, Navaya JMarland Kitchen ([id number]) -------------------------------------------------------------------------------- Patient/Caregiver Education Details Patient Name: [place], Lidia Gordon. Date of Service: 07/21/2017 12:30 PM Medical Record Number: [id number] Patient Account Number: [id number] Date of Birth/Gender: April 26, 1930 (84 y.o. Female) Treating RN: Renne Crigler Primary Care Physician: [name] Other Clinician: Referring Physician: Referral, Self Treating Physician/Extender: Skeet Simmer in Treatment: 0 Education Assessment Education Provided To: Patient and Caregiver son Education Topics Provided Welcome To The Wound Care Center: Handouts: Welcome To The Wound Care Center Methods: Explain/Verbal Responses: State content correctly Wound/Skin Impairment: Handouts: Caring for Your Ulcer Methods: Explain/Verbal Responses: State  content correctly Electronic Signature(s) Signed: 07/21/2017 4:11:02 PM  By: Renne CriglerFlinchum, Cheryl Entered By: Renne CriglerFlinchum, Cheryl on 07/21/2017 13:56:58 Honse, Chiquitta Shela CommonsJ. ([id number]) -------------------------------------------------------------------------------- Wound Assessment Details Patient Name: CrystalSMITH, Lidia [name] Date of Service: 07/21/2017 12:30 PM Medical Record Number: [id number] Patient Account Number: [id number] Date of Birth/Sex: 05-21-1930 (84 y.o. Female) Treating RN: Renne CriglerFlinchum, Cheryl Primary Care Hala Narula: [name] NicksMACDONALD, KERI Other Clinician: Referring Deloras Reichard: Referral, Self Treating Elman Dettman/Extender: STONE III, HOYT Weeks in Treatment: 0 Wound Status Wound Number: 3 Primary Lymphedema Etiology: Wound Location: Left Lower Leg - Lateral, Proximal Wound Open Wounding Event: Gradually Appeared Status: Date Acquired: 05/21/2017 Comorbid Lymphedema, Asthma, Chronic Obstructive Weeks Of Treatment: 0 History: Pulmonary Disease (COPD), Deep Vein Clustered Wound: No Thrombosis, Received Chemotherapy, Received Radiation Photos Photo Uploaded By: Renne CriglerFlinchum, Cheryl on 07/21/2017 16:14:22 Wound Measurements Length: (cm) 0.8 Width: (cm) 0.6 Depth: (cm) 0.1 Area: (cm) 0.377 Volume: (cm) 0.038 % Reduction in Area: 0% % Reduction in Volume: 0% Epithelialization: None Tunneling: No Undermining: No Wound Description Classification: Partial Thickness Wound Margin: Flat and Intact Exudate Amount: Large Exudate Type: Serous Exudate Color: amber Foul Odor After Cleansing: No Slough/Fibrino Yes Wound Bed Granulation Amount: Small (1-33%) Exposed Structure Granulation Quality: Pink Fascia Exposed: No Necrotic Amount: Large (67-100%) Fat Layer (Subcutaneous Tissue) Exposed: Yes Necrotic Quality: Adherent Slough Tendon Exposed: No Muscle Exposed: No Joint Exposed: No Bone Exposed: No Klausner, Perry J. ([id number]) Periwound Skin Texture Texture Color No Abnormalities Noted: No No  Abnormalities Noted: No Callus: No Atrophie Blanche: No Crepitus: No Cyanosis: No Excoriation: No Ecchymosis: No Induration: No Erythema: No Rash: No Hemosiderin Staining: No Scarring: No Mottled: No Pallor: No Moisture Rubor: No No Abnormalities Noted: No Dry / Scaly: No Maceration: No Wound Preparation Ulcer Cleansing: Rinsed/Irrigated with Saline Topical Anesthetic Applied: Other: lidocaine 4%, Treatment Notes Wound #3 (Left, Proximal, Lateral Lower Leg) 1. Cleansed with: Clean wound with Normal Saline 2. Anesthetic Topical Lidocaine 4% cream to wound bed prior to debridement 3. Peri-wound Care: Moisturizing lotion 4. Dressing Applied: Other dressing (specify in notes) 7. Secured with 3 Layer Compression System - Left Lower Extremity Notes silver cell, xtrasorb . unna boot to Ecologistanchor Electronic Signature(s) Signed: 07/21/2017 4:11:02 PM By: Renne CriglerFlinchum, Cheryl Entered By: Renne CriglerFlinchum, Cheryl on 07/21/2017 13:19:19 Memon, Vinisha Shela CommonsJ. ([id number]) -------------------------------------------------------------------------------- Vitals Details Patient Name: [place], [name] Date of Service: 07/21/2017 12:30 PM Medical Record Number: [id number] Patient Account Number: [id number] Date of Birth/Sex: 05-21-1930 (84 y.o. Female) Treating RN: Renne CriglerFlinchum, Cheryl Primary Care Saga Balthazar: [name] NicksMACDONALD, KERI Other Clinician: Referring Marjory Meints: Referral, Self Treating Yadier Bramhall/Extender: Linwood DibblesSTONE III, HOYT Weeks in Treatment: 0 Vital Signs Time Taken: 12:54 Temperature (F): 98.3 Height (in): 66 Pulse (bpm): 82 Source: Stated Respiratory Rate (breaths/min): 22 Blood Pressure (mmHg): 158/59 Reference Range: 80 - 120 mg / dl Electronic Signature(s) Signed: 07/21/2017 4:11:02 PM By: Renne CriglerFlinchum, Cheryl Entered By: Renne CriglerFlinchum, Cheryl on 07/21/2017 12:55:25

## 2017-07-22 NOTE — Progress Notes (Signed)
[place], [place] ([id number]) Visit Report for 07/21/2017 Abuse/Suicide Risk Screen Details Patient Name: [name] CoombsSMITH, [name] J. Date of Service: 07/21/2017 12:30 PM Medical Record Number: [id number] Patient Account Number: [id number] Date of Birth/Sex: [birth year] (84 y.o. Female) Treating RN: Renne CriglerFlinchum, Cheryl Primary Care Ndea Kilroy: Etheleen NicksMACDONALD, KERI Other Clinician: Referring Amonie Wisser: Referral, Self Treating Omario Ander/Extender: STONE III, HOYT Weeks in Treatment: 0 Abuse/Suicide Risk Screen Items Answer ABUSE/SUICIDE RISK SCREEN: Has anyone close to you tried to hurt or harm you recentlyo No Do you feel uncomfortable with anyone in your familyo No Has anyone forced you do things that you didnot want to doo No Do you have any thoughts of harming yourselfo No Patient displays signs or symptoms of abuse and/or neglect. No Electronic Signature(s) Signed: 07/21/2017 4:11:02 PM By: Renne CriglerFlinchum, Cheryl Entered By: Renne CriglerFlinchum, Cheryl on 07/21/2017 13:01:33 Majka, [name] ([id number]) -------------------------------------------------------------------------------- Activities of Daily Living Details Patient Name: [name], [name] Date of Service: 07/21/2017 12:30 PM Medical Record Number: [id number] Patient Account Number: [id number] Date of Birth/Sex: [birth year] (84 y.o. Female) Treating RN: Renne CriglerFlinchum, Cheryl Primary Care Jomayra Novitsky: Etheleen NicksMACDONALD, KERI Other Clinician: Referring Jeriann Sayres: Referral, Self Treating Yamaira Spinner/Extender: Linwood DibblesSTONE III, HOYT Weeks in Treatment: 0 Activities of Daily Living Items Answer Activities of Daily Living (Please select one for each item) Drive Automobile Need Assistance Take Medications Need Assistance Use Telephone Need Assistance Care for Appearance Need Assistance Use Toilet Need Assistance Bath / Shower Need Assistance Dress Self Need Assistance Feed Self Need Assistance Walk Need Assistance Get In / Out Bed Need Assistance Housework Need Assistance Prepare Meals Need  Assistance Handle Money Need Assistance Shop for Self Need Assistance Electronic Signature(s) Signed: 07/21/2017 4:11:02 PM By: Renne CriglerFlinchum, Cheryl Entered By: Renne CriglerFlinchum, Cheryl on 07/21/2017 13:02:04 Valliant, [name] ([id number]) -------------------------------------------------------------------------------- Education Assessment Details Patient Name: [name], [name] Date of Service: 07/21/2017 12:30 PM Medical Record Number: [id number] Patient Account Number: [id number] Date of Birth/Sex: [birth year] (84 y.o. Female) Treating RN: Renne CriglerFlinchum, Cheryl Primary Care Annibelle Brazie: Etheleen NicksMACDONALD, KERI Other Clinician: Referring Katelin Kutsch: Referral, Self Treating Kynsie Falkner/Extender: Linwood DibblesSTONE III, HOYT Weeks in Treatment: 0 Primary Learner Assessed: Caregiver son Learning Preferences/Education Level/Primary Language Learning Preference: Explanation Highest Education Level: High School Preferred Language: English Cognitive Barrier Assessment/Beliefs Language Barrier: No Translator Needed: No Memory Deficit: No Emotional Barrier: No Cultural/Religious Beliefs Affecting Medical Care: No Physical Barrier Assessment Impaired Vision: Yes Glasses Impaired Hearing: No Decreased Hand dexterity: No Knowledge/Comprehension Assessment Knowledge Level: High Comprehension Level: High Ability to understand written High instructions: Ability to understand verbal High instructions: Motivation Assessment Anxiety Level: Calm Cooperation: Cooperative Education Importance: Acknowledges Need Interest in Health Problems: Asks Questions Perception: Coherent Willingness to Engage in Self- High Management Activities: Readiness to Engage in Self- High Management Activities: Electronic Signature(s) Signed: 07/21/2017 4:11:02 PM By: Renne CriglerFlinchum, Cheryl Entered By: Renne CriglerFlinchum, Cheryl on 07/21/2017 13:02:35 Tarango, [name] ([id number]) -------------------------------------------------------------------------------- Fall Risk  Assessment Details Patient Name: [name], [name] Date of Service: 07/21/2017 12:30 PM Medical Record Number: [id number] Patient Account Number: [id number] Date of Birth/Sex: [birth year] (84 y.o. Female) Treating RN: Renne CriglerFlinchum, Cheryl Primary Care Raymie Trani: Etheleen NicksMACDONALD, KERI Other Clinician: Referring Brewer Hitchman: Referral, Self Treating Marjorie Deprey/Extender: Linwood DibblesSTONE III, HOYT Weeks in Treatment: 0 Fall Risk Assessment Items Have you had 2 or more falls in the last 12 monthso 0 No Have you had any fall that resulted in injury in the last 12 monthso 0 No FALL RISK ASSESSMENT: History of falling - immediate or within 3 months 0 No Secondary diagnosis 0 No Ambulatory aid None/bed rest/wheelchair/nurse 0 Yes Crutches/cane/walker 0 No Furniture  0 No IV Access/Saline Lock 0 No Gait/Training Normal/bed rest/immobile 0 Yes Weak 0 No Impaired 0 No Mental Status Oriented to own ability 0 Yes Electronic Signature(s) Signed: 07/21/2017 4:11:02 PM By: Renne Crigler Entered By: Renne Crigler on 07/21/2017 13:02:59 Zucker, Tiwanda J. ([id number]) -------------------------------------------------------------------------------- Foot Assessment Details Patient Name: [name]. Date of Service: 07/21/2017 12:30 PM Medical Record Number: [id number] Patient Account Number: [id number] Date of Birth/Sex: August 24, 1929 (84 y.o. Female) Treating RN: Renne Crigler Primary Care Lysle Yero: Etheleen Nicks Other Clinician: Referring Bettylou Frew: Referral, Self Treating Oreste Majeed/Extender: Linwood Dibbles, HOYT Weeks in Treatment: 0 Foot Assessment Items Site Locations + = Sensation present, - = Sensation absent, C = Callus, U = Ulcer R = Redness, W = Warmth, M = Maceration, PU = Pre-ulcerative lesion F = Fissure, S = Swelling, D = Dryness Assessment Right: Left: Other Deformity: No No Prior Foot Ulcer: No No Prior Amputation: No No Charcot Joint: No No Ambulatory Status: Non-ambulatory Assistance Device:  Wheelchair Gait: Surveyor, mining) Signed: 07/21/2017 4:11:02 PM By: Renne Crigler Entered By: Renne Crigler on 07/21/2017 13:04:13 Delmont, Addaline J. ([id number]) -------------------------------------------------------------------------------- Nutrition Risk Assessment Details Patient Name: Dupont, [name]. Date of Service: 07/21/2017 12:30 PM Medical Record Number: [id number] Patient Account Number: [id number] Date of Birth/Sex: October 25, 1929 (84 y.o. Female) Treating RN: Renne Crigler Primary Care Melvern Ramone: Etheleen Nicks Other Clinician: Referring Macio Kissoon: Referral, Self Treating Warner Laduca/Extender: STONE III, HOYT Weeks in Treatment: 0 Height (in): 66 Weight (lbs): Body Mass Index (BMI): Nutrition Risk Assessment Items NUTRITION RISK SCREEN: I have an illness or condition that made me change the kind and/or amount of 0 No food I eat I eat fewer than two meals per day 0 No I eat few fruits and vegetables, or milk products 0 No I have three or more drinks of beer, liquor or wine almost every day 0 No I have tooth or mouth problems that make it hard for me to eat 0 No I don't always have enough money to buy the food I need 0 No I eat alone most of the time 0 No I take three or more different prescribed or over-the-counter drugs a day 0 No Without wanting to, I have lost or gained 10 pounds in the last six months 0 No I am not always physically able to shop, cook and/or feed myself 0 No Nutrition Protocols Good Risk Protocol 0 No interventions needed Moderate Risk Protocol Electronic Signature(s) Signed: 07/21/2017 4:11:02 PM By: Renne Crigler Entered By: Renne Crigler on 07/21/2017 13:03:12

## 2017-07-27 ENCOUNTER — Encounter: Payer: Medicare Other | Admitting: Physician Assistant

## 2017-07-27 DIAGNOSIS — I80203 Phlebitis and thrombophlebitis of unspecified deep vessels of lower extremities, bilateral: Secondary | ICD-10-CM | POA: Diagnosis not present

## 2017-07-28 ENCOUNTER — Ambulatory Visit: Payer: Medicare Other | Admitting: Physician Assistant

## 2017-07-28 NOTE — Progress Notes (Signed)
CORINN, STOLTZFUS ([id number]) Visit Report for 07/27/2017 Arrival Information Details Patient Name: [place], [name] Date of Service: 07/27/2017 2:45 PM Medical Record Number: [id number] Patient Account Number: [id number] Date of Birth/Sex: [birth year] (84 y.o. Female) Treating RN: Renne Crigler Primary Care Delano Frate: Etheleen Nicks Other Clinician: Referring Evanee Lubrano: Etheleen Nicks Treating Thurley Francesconi/Extender: Linwood Dibbles, HOYT Weeks in Treatment: 0 Visit Information History Since Last Visit All ordered tests and consults were completed: No Patient Arrived: Wheel Chair Added or deleted any medications: No Arrival Time: 15:10 Any new allergies or adverse reactions: No Accompanied By: son Had a fall or experienced change in No activities of daily living that may affect Transfer Assistance: None risk of falls: Patient Identification Verified: Yes Signs or symptoms of abuse/neglect since last visito No Secondary Verification Process Completed: Yes Hospitalized since last visit: No Patient Requires Transmission-Based No Pain Present Now: Yes Precautions: Patient Has Alerts: No Electronic Signature(s) Signed: 07/27/2017 4:08:26 PM By: Renne Crigler Entered By: Renne Crigler on 07/27/2017 15:11:51 Broerman, Loretha JMarland Kitchen ([id number]) -------------------------------------------------------------------------------- Clinic Level of Care Assessment Details Patient Name: [place], [name] Date of Service: 07/27/2017 2:45 PM Medical Record Number: [id number] Patient Account Number: [id number] Date of Birth/Sex: Apr 08, 1930 (84 y.o. Female) Treating RN: Renne Crigler Primary Care Keyan Folson: Etheleen Nicks Other Clinician: Referring Taneah Masri: Etheleen Nicks Treating Javae Braaten/Extender: Linwood Dibbles, HOYT Weeks in Treatment: 0 Clinic Level of Care Assessment Items TOOL 4 Quantity Score X - Use when only an EandM is performed on FOLLOW-UP visit 1 0 ASSESSMENTS - Nursing Assessment / Reassessment []  -  Reassessment of Co-morbidities (includes updates in patient status) 0 []  - 0 Reassessment of Adherence to Treatment Plan ASSESSMENTS - Wound and Skin Assessment / Reassessment X - Simple Wound Assessment / Reassessment - one wound 1 5 []  - 0 Complex Wound Assessment / Reassessment - multiple wounds []  - 0 Dermatologic / Skin Assessment (not related to wound area) ASSESSMENTS - Focused Assessment []  - Circumferential Edema Measurements - multi extremities 0 []  - 0 Nutritional Assessment / Counseling / Intervention []  - 0 Lower Extremity Assessment (monofilament, tuning fork, pulses) []  - 0 Peripheral Arterial Disease Assessment (using hand held doppler) ASSESSMENTS - Ostomy and/or Continence Assessment and Care []  - Incontinence Assessment and Management 0 []  - 0 Ostomy Care Assessment and Management (repouching, etc.) PROCESS - Coordination of Care X - Simple Patient / Family Education for ongoing care 1 15 []  - 0 Complex (extensive) Patient / Family Education for ongoing care []  - 0 Staff obtains Chiropractor, Records, Test Results / Process Orders []  - 0 Staff telephones HHA, Nursing Homes / Clarify orders / etc []  - 0 Routine Transfer to another Facility (non-emergent condition) []  - 0 Routine Hospital Admission (non-emergent condition) []  - 0 New Admissions / Manufacturing engineer / Ordering NPWT, Apligraf, etc. []  - 0 Emergency Hospital Admission (emergent condition) X- 1 10 Simple Discharge Coordination Reklaw, Emaan J. ([id number]) []  - 0 Complex (extensive) Discharge Coordination PROCESS - Special Needs []  - Pediatric / Minor Patient Management 0 []  - 0 Isolation Patient Management []  - 0 Hearing / Language / Visual special needs []  - 0 Assessment of Community assistance (transportation, D/C planning, etc.) []  - 0 Additional assistance / Altered mentation []  - 0 Support Surface(s) Assessment (bed, cushion, seat, etc.) INTERVENTIONS - Wound Cleansing /  Measurement X - Simple Wound Cleansing - one wound 1 5 []  - 0 Complex Wound Cleansing - multiple wounds X- 1 5 Wound Imaging (photographs - any number of wounds) []  -  0 Wound Tracing (instead of photographs) X- 1 5 Simple Wound Measurement - one wound []  - 0 Complex Wound Measurement - multiple wounds INTERVENTIONS - Wound Dressings X - Small Wound Dressing one or multiple wounds 1 10 []  - 0 Medium Wound Dressing one or multiple wounds []  - 0 Large Wound Dressing one or multiple wounds []  - 0 Application of Medications - topical []  - 0 Application of Medications - injection INTERVENTIONS - Miscellaneous []  - External ear exam 0 []  - 0 Specimen Collection (cultures, biopsies, blood, body fluids, etc.) []  - 0 Specimen(s) / Culture(s) sent or taken to Lab for analysis []  - 0 Patient Transfer (multiple staff / Nurse, adult / Similar devices) []  - 0 Simple Staple / Suture removal (25 or less) []  - 0 Complex Staple / Suture removal (26 or more) []  - 0 Hypo / Hyperglycemic Management (close monitor of Blood Glucose) []  - 0 Ankle / Brachial Index (ABI) - do not check if billed separately X- 1 5 Vital Signs [place], Mira J. ([id number]) Has the patient been seen at the hospital within the last three years: Yes Total Score: 60 Level Of Care: New/Established - Level 2 Electronic Signature(s) Signed: 07/27/2017 4:08:26 PM By: Renne Crigler Entered By: Renne Crigler on 07/27/2017 15:39:40 Koy, Kelani Shela Commons ([id number]) -------------------------------------------------------------------------------- Encounter Discharge Information Details Patient Name: [place], [name] Date of Service: 07/27/2017 2:45 PM Medical Record Number: [id number] Patient Account Number: [id number] Date of Birth/Sex: 13-May-1930 (84 y.o. Female) Treating RN: Renne Crigler Primary Care Rylee Nuzum: Etheleen Nicks Other Clinician: Referring Yan Pankratz: Etheleen Nicks Treating Latwan Luchsinger/Extender: Linwood Dibbles,  HOYT Weeks in Treatment: 0 Encounter Discharge Information Items Discharge Pain Level: 0 Discharge Condition: Stable Ambulatory Status: Wheelchair Discharge Destination: Home Transportation: Other Accompanied By: son Schedule Follow-up Appointment: Yes Medication Reconciliation completed and No provided to Patient/Care Jontae Adebayo: Provided on Clinical Summary of Care: 07/27/2017 Form Type Recipient Paper Patient IS Electronic Signature(s) Signed: 07/27/2017 4:08:26 PM By: Renne Crigler Entered By: Renne Crigler on 07/27/2017 15:56:17 Helfrich, Carlisha J. ([id number]) -------------------------------------------------------------------------------- Lower Extremity Assessment Details Patient Name: [place], [name] Date of Service: 07/27/2017 2:45 PM Medical Record Number: [id number] Patient Account Number: [id number] Date of Birth/Sex: 07/25/1929 (84 y.o. Female) Treating RN: Renne Crigler Primary Care Basil Buffin: Etheleen Nicks Other Clinician: Referring Tallulah Hosman: Etheleen Nicks Treating Houston Surges/Extender: Linwood Dibbles, HOYT Weeks in Treatment: 0 Edema Assessment Assessed: [Left: No] [Right: No] Edema: [Left: Ye] [Right: s] Calf Left: Right: Point of Measurement: 35 cm From Medial Instep 49.2 cm cm Ankle Left: Right: Point of Measurement: 12 cm From Medial Instep 27.6 cm cm Vascular Assessment Claudication: Claudication Assessment [Left:None] Pulses: Dorsalis Pedis Palpable: [Left:Yes] Posterior Tibial Extremity colors, hair growth, and conditions: Extremity Color: [Left:Normal] Hair Growth on Extremity: [Left:No] Temperature of Extremity: [Left:Warm] Capillary Refill: [Left:< 3 seconds] Toe Nail Assessment Left: Right: Thick: Yes Discolored: Yes Deformed: Yes Improper Length and Hygiene: Yes Electronic Signature(s) Signed: 07/27/2017 4:08:26 PM By: Renne Crigler Entered By: Renne Crigler on 07/27/2017 15:26:16 Vogt, Lynzi J.  ([id number]) -------------------------------------------------------------------------------- Multi Wound Chart Details Patient Name: [place], [name] Date of Service: 07/27/2017 2:45 PM Medical Record Number: [id number] Patient Account Number: [id number] Date of Birth/Sex: [birth year] (84 y.o. Female) Treating RN: Renne Crigler Primary Care Kaion Tisdale: Etheleen Nicks Other Clinician: Referring Kathrina Crosley: Etheleen Nicks Treating Rand Boller/Extender: Linwood Dibbles, HOYT Weeks in Treatment: 0 Vital Signs Height(in): 66 Pulse(bpm): 66 Weight(lbs): Blood Pressure(mmHg): 146/100 Body Mass Index(BMI): Temperature(F): 97.7 Respiratory Rate 20 (breaths/min): Photos: [3:No Photos] [N/A:N/A] Wound Location: [3:Left Lower Leg - Lateral,  Proximal] [N/A:N/A] Wounding Event: [3:Gradually Appeared] [N/A:N/A] Primary Etiology: [3:Lymphedema] [N/A:N/A] Comorbid History: [3:Lymphedema, Asthma, Chronic Obstructive Pulmonary Disease (COPD), Deep Vein Thrombosis, Received Chemotherapy, Received Radiation] [N/A:N/A] Date Acquired: [3:05/21/2017] [N/A:N/A] Weeks of Treatment: [3:0] [N/A:N/A] Wound Status: [3:Open] [N/A:N/A] Measurements L x W x D [3:0.2x0.2x0.1] [N/A:N/A] (cm) Area (cm) : [3:0.031] [N/A:N/A] Volume (cm) : [3:0.003] [N/A:N/A] % Reduction in Area: [3:91.80%] [N/A:N/A] % Reduction in Volume: [3:92.10%] [N/A:N/A] Classification: [3:Partial Thickness] [N/A:N/A] Exudate Amount: [3:Small] [N/A:N/A] Exudate Type: [3:Serous] [N/A:N/A] Exudate Color: [3:amber] [N/A:N/A] Wound Margin: [3:Flat and Intact] [N/A:N/A] Granulation Amount: [3:Large (67-100%)] [N/A:N/A] Granulation Quality: [3:Pink] [N/A:N/A] Necrotic Amount: [3:Small (1-33%)] [N/A:N/A] Exposed Structures: [3:Fat Layer (Subcutaneous Tissue) Exposed: Yes Fascia: No Tendon: No Muscle: No Joint: No Bone: No] [N/A:N/A] Epithelialization: [3:Large (67-100%)] [N/A:N/A] Periwound Skin Texture: [N/A:N/A] Excoriation: No Induration:  No Callus: No Crepitus: No Rash: No Scarring: No Periwound Skin Moisture: Maceration: No N/A N/A Dry/Scaly: No Periwound Skin Color: Atrophie Blanche: No N/A N/A Cyanosis: No Ecchymosis: No Erythema: No Hemosiderin Staining: No Mottled: No Pallor: No Rubor: No Tenderness on Palpation: No N/A N/A Wound Preparation: Ulcer Cleansing: N/A N/A Rinsed/Irrigated with Saline Topical Anesthetic Applied: Other: lidocaine 4% Treatment Notes Electronic Signature(s) Signed: 07/27/2017 4:08:26 PM By: Renne Crigler Entered By: Renne Crigler on 07/27/2017 15:34:52 Pebley, Alfie Shela Commons ([id number]) -------------------------------------------------------------------------------- Multi-Disciplinary Care Plan Details Patient Name: [place], [name] Date of Service: 07/27/2017 2:45 PM Medical Record Number: [id number] Patient Account Number: [id number] Date of Birth/Sex: Nov 16, 1929 (84 y.o. Female) Treating RN: Renne Crigler Primary Care Holle Sprick: Etheleen Nicks Other Clinician: Referring Deng Kemler: Etheleen Nicks Treating Dmitri Pettigrew/Extender: Linwood Dibbles, HOYT Weeks in Treatment: 0 Active Inactive ` Orientation to the Wound Care Program Nursing Diagnoses: Knowledge deficit related to the wound healing center program Goals: Patient/caregiver will verbalize understanding of the Wound Healing Center Program Date Initiated: 07/21/2017 Target Resolution Date: 08/11/2017 Goal Status: Active Interventions: Provide education on orientation to the wound center Notes: ` Wound/Skin Impairment Nursing Diagnoses: Impaired tissue integrity Knowledge deficit related to ulceration/compromised skin integrity Goals: Patient/caregiver will verbalize understanding of skin care regimen Date Initiated: 07/21/2017 Target Resolution Date: 08/11/2017 Goal Status: Active Ulcer/skin breakdown will have a volume reduction of 30% by week 4 Date Initiated: 07/21/2017 Target Resolution Date: 08/11/2017 Goal Status:  Active Interventions: Assess patient/caregiver ability to obtain necessary supplies Assess patient/caregiver ability to perform ulcer/skin care regimen upon admission and as needed Assess ulceration(s) every visit Treatment Activities: Skin care regimen initiated : 07/21/2017 Notes: Electronic Signature(s) Signed: 07/27/2017 4:08:26 PM By: Sharlotte Alamo, Lona J. ([id number]) Entered By: Renne Crigler on 07/27/2017 15:34:44 Rahrig, Tarissa J. ([id number]) -------------------------------------------------------------------------------- Pain Assessment Details Patient Name: [place], [name] Date of Service: 07/27/2017 2:45 PM Medical Record Number: [id number] Patient Account Number: [id number] Date of Birth/Sex: 04-Sep-1929 (84 y.o. Female) Treating RN: Renne Crigler Primary Care Joquan Lotz: Etheleen Nicks Other Clinician: Referring Doyt Castellana: Etheleen Nicks Treating Paxton Kanaan/Extender: Linwood Dibbles, HOYT Weeks in Treatment: 0 Active Problems Location of Pain Severity and Description of Pain Patient Has Paino Yes Site Locations Pain Location: Generalized Pain Duration of the Pain. Constant / Intermittento Intermittent Pain Management and Medication Current Pain Management: Notes left knee pain Electronic Signature(s) Signed: 07/27/2017 4:08:26 PM By: Renne Crigler Entered By: Renne Crigler on 07/27/2017 15:12:10 Mcbrayer, Caniya Shela Commons ([id number]) -------------------------------------------------------------------------------- Patient/Caregiver Education Details Patient Name: [place], [name] Date of Service: 07/27/2017 2:45 PM Medical Record Number: [id number] Patient Account Number: [id number] Date of Birth/Gender: 05-08-1930 (84 y.o. Female) Treating RN: Renne Crigler Primary Care Physician: Etheleen Nicks Other Clinician: Referring Physician:  Etheleen NicksMACDONALD, KERI Treating Physician/Extender: Skeet SimmerSTONE III, HOYT Weeks in Treatment: 0 Education Assessment Education Provided  To: Patient and Caregiver HHRN orders faxed Education Topics Provided Wound/Skin Impairment: Handouts: Caring for Your Ulcer Methods: Explain/Verbal Responses: State content correctly Electronic Signature(s) Signed: 07/27/2017 4:08:26 PM By: Renne CriglerFlinchum, Cheryl Entered By: Renne CriglerFlinchum, Cheryl on 07/27/2017 15:56:36 Rijo, Everline J. ([id number]) -------------------------------------------------------------------------------- Wound Assessment Details Patient Name: [place], Lidia [name] Date of Service: 07/27/2017 2:45 PM Medical Record Number: [id number] Patient Account Number: [id number] Date of Birth/Sex: 06/06/1930 (84 y.o. Female) Treating RN: Renne CriglerFlinchum, Cheryl Primary Care Jamel Dunton: Etheleen NicksMACDONALD, KERI Other Clinician: Referring Liliana Brentlinger: Etheleen NicksMACDONALD, KERI Treating Legna Mausolf/Extender: Linwood DibblesSTONE III, HOYT Weeks in Treatment: 0 Wound Status Wound Number: 3 Primary Lymphedema Etiology: Wound Location: Left Lower Leg - Lateral, Proximal Wound Open Wounding Event: Gradually Appeared Status: Date Acquired: 05/21/2017 Comorbid Lymphedema, Asthma, Chronic Obstructive Weeks Of Treatment: 0 History: Pulmonary Disease (COPD), Deep Vein Clustered Wound: No Thrombosis, Received Chemotherapy, Received Radiation Photos Photo Uploaded By: Renne CriglerFlinchum, Cheryl on 07/27/2017 16:08:10 Wound Measurements Length: (cm) 0.2 Width: (cm) 0.2 Depth: (cm) 0.1 Area: (cm) 0.031 Volume: (cm) 0.003 % Reduction in Area: 91.8% % Reduction in Volume: 92.1% Epithelialization: Large (67-100%) Tunneling: No Undermining: No Wound Description Classification: Partial Thickness Wound Margin: Flat and Intact Exudate Amount: Small Exudate Type: Serous Exudate Color: amber Foul Odor After Cleansing: No Slough/Fibrino Yes Wound Bed Granulation Amount: Large (67-100%) Exposed Structure Granulation Quality: Pink Fascia Exposed: No Necrotic Amount: Small (1-33%) Fat Layer (Subcutaneous Tissue) Exposed: Yes Necrotic Quality:  Adherent Slough Tendon Exposed: No Muscle Exposed: No Joint Exposed: No Bone Exposed: No Lucente, Mikaela J. ([id number]) Periwound Skin Texture Texture Color No Abnormalities Noted: No No Abnormalities Noted: No Callus: No Atrophie Blanche: No Crepitus: No Cyanosis: No Excoriation: No Ecchymosis: No Induration: No Erythema: No Rash: No Hemosiderin Staining: No Scarring: No Mottled: No Pallor: No Moisture Rubor: No No Abnormalities Noted: No Dry / Scaly: No Maceration: No Wound Preparation Ulcer Cleansing: Rinsed/Irrigated with Saline Topical Anesthetic Applied: Other: lidocaine 4%, Treatment Notes Wound #3 (Left, Proximal, Lateral Lower Leg) 1. Cleansed with: Clean wound with Normal Saline Cleanse wound with antibacterial soap and water 2. Anesthetic Topical Lidocaine 4% cream to wound bed prior to debridement 4. Dressing Applied: Other dressing (specify in notes) 7. Secured with 3 Layer Compression System - Left Lower Extremity Notes silver cell, xtrasorb . unna boot to Ecologistanchor Electronic Signature(s) Signed: 07/27/2017 4:08:26 PM By: Renne CriglerFlinchum, Cheryl Entered By: Renne CriglerFlinchum, Cheryl on 07/27/2017 15:21:22 Lyerly, Lidia [name] ([id number]) -------------------------------------------------------------------------------- Vitals Details Patient Name: [place], Lidia [name] Date of Service: 07/27/2017 2:45 PM Medical Record Number: [id number] Patient Account Number: [id number] Date of Birth/Sex: 06/06/1930 (84 y.o. Female) Treating RN: Renne CriglerFlinchum, Cheryl Primary Care Vaishali Baise: Etheleen NicksMACDONALD, KERI Other Clinician: Referring Amazin Pincock: Etheleen NicksMACDONALD, KERI Treating Norinne Jeane/Extender: Linwood DibblesSTONE III, HOYT Weeks in Treatment: 0 Vital Signs Time Taken: 03:12 Temperature (F): 97.7 Height (in): 66 Pulse (bpm): 66 Respiratory Rate (breaths/min): 20 Blood Pressure (mmHg): 146/100 Reference Range: 80 - 120 mg / dl Electronic Signature(s) Signed: 07/27/2017 4:08:26 PM By: Renne CriglerFlinchum, Cheryl Entered By:  Renne CriglerFlinchum, Cheryl on 07/27/2017 15:12:33

## 2017-07-28 NOTE — Progress Notes (Signed)
MARLIN, BRYS ([id number]) Visit Report for 07/27/2017 Chief Complaint Document Details Patient Name: [name]. Date of Service: 07/27/2017 2:45 PM Medical Record Number: [id number] Patient Account Number: [id number] Date of Birth/Sex: 11/03/1929 (84 y.o. Female) Treating RN: Renne Crigler Primary Care Provider: Etheleen Nicks Other Clinician: Referring Provider: Etheleen Nicks Treating Provider/Extender: Linwood Dibbles, Coda Mathey Weeks in Treatment: 0 Information Obtained from: Patient Chief Complaint Left lateral calf ulcer Electronic Signature(s) Signed: 07/27/2017 4:26:52 PM By: Lenda Kelp PA-C Entered By: Lenda Kelp on 07/27/2017 15:31:49 [name], [name] J. ([id number]) -------------------------------------------------------------------------------- HPI Details Patient Name: [name]. Date of Service: 07/27/2017 2:45 PM Medical Record Number: [id number] Patient Account Number: [id number] Date of Birth/Sex: [birth year] (84 y.o. Female) Treating RN: Renne Crigler Primary Care Provider: Etheleen Nicks Other Clinician: Referring Provider: Etheleen Nicks Treating Provider/Extender: Linwood Dibbles, Iyesha Such Weeks in Treatment: 0 History of Present Illness Modifying Factors: Other treatment(s) tried include:she has been wearing what sounds like lymphedema pumps for the last 12 years or so HPI Description: 84 year old patient who comes with bilateral lower extremity edema which she's had for at least 15 years and now an ulcerated area on the left lower extremity which she's had for a month. He was recently seen by the PCP who made a diagnosis of venous stasis ulcer of the left calf and referred her to the wound center. He was placed on clindamycin. Past medical history significant for colon cancer, history of DVT, left tibial fracture in 2012 which required surgery, hyperlipidemia and has been on warfarin for several years. She also gives a history of having lymph pumps and she has  been wearing them for about 12 years. She is a former smoker and has quit about 10 years ago. 06/23/2016 -- she has not yet got her venous duplex study done but she did bring her lymphedema pumps and we have checked him out and they seem to be functioning fine 07/03/2016 -- a venous duplex study has not been scheduled for another month and I believe she has an appointment on January 28 07/31/2016 -- the patient continues to have significant lymphedema and she does not have appropriate compression stockings with her at home Readmission: 07/21/17 on evaluation today patient presents for reevaluation concerning a small ulceration she has of the left lateral calf region. This has been present for a couple of weeks prior to initial evaluation here in the clinic. She has not been on any recent antibiotics although she has been treated for a deep vein thrombosis which is almost completely resolved she is actually going to be seeing next month a vascular specialist at [facility]. Up to this point she has been managed mainly by her primary care provider according to patient. She does not have any significant discomfort which is good news. Unfortunately she is having some mild pain though note purulent discharge in the lateral calf wound. No fevers, chills, nausea, or vomiting noted at this time. 07/27/17 on evaluation today patient appears to be doing excellent in regard to her left lower extremity ulcer. This is not terribly smaller but it is doing much better there is less leaking from the wound and her leg seems to be much smaller after using the compression over the last week. She has not been using her lymphedema pumps due to the fact that that did bother her last week it was hurting and she even attempted to do this. The good news is that doesn't seem to be as bad at this point and I  think we may be able to reinitiate the lymphedema pumps. Again we are attempting to get this ulcer closed and doing better so  that she can proceed with treatment at the lymphedema clinic. Electronic Signature(s) Signed: 07/27/2017 4:26:52 PM By: Lenda Kelp PA-C Entered By: Lenda Kelp on 07/27/2017 16:07:32 [name] [name] ([id number]) -------------------------------------------------------------------------------- Physical Exam Details Patient Name: [name]. Date of Service: 07/27/2017 2:45 PM Medical Record Number: [id number] Patient Account Number: [id number] Date of Birth/Sex: 1929-12-25 (84 y.o. Female) Treating RN: Renne Crigler Primary Care Provider: Etheleen Nicks Other Clinician: Referring Provider: Etheleen Nicks Treating Provider/Extender: Linwood Dibbles, Fate Galanti Weeks in Treatment: 0 Constitutional Obese and well-hydrated in no acute distress. Respiratory normal breathing without difficulty. clear to auscultation bilaterally. Cardiovascular regular rate and rhythm with normal S1, S2. 1+ pitting edema of the bilateral lower extremities. Psychiatric this patient is able to make decisions and demonstrates good insight into disease process. Alert and Oriented x 3. pleasant and cooperative. Notes Patient's wound bed appears to be doing well there's no evidence of infection no significant erythema. She does have a little bit of weeping but overall this is very mild. Electronic Signature(s) Signed: 07/27/2017 4:26:52 PM By: Lenda Kelp PA-C Entered By: Lenda Kelp on 07/27/2017 16:08:34 [name] ([id number]) -------------------------------------------------------------------------------- Physician Orders Details Patient Name: [name]. Date of Service: 07/27/2017 2:45 PM Medical Record Number: [id number] Patient Account Number: [id number] Date of Birth/Sex: November 03, 1929 (84 y.o. Female) Treating RN: Renne Crigler Primary Care Provider: Etheleen Nicks Other Clinician: Referring Provider: Etheleen Nicks Treating Provider/Extender: Linwood Dibbles, Elliott Quade Weeks in Treatment:  0 Verbal / Phone Orders: No Diagnosis Coding ICD-10 Coding Code Description I89.0 Lymphedema, not elsewhere classified L97.221 Non-pressure chronic ulcer of left calf limited to breakdown of skin I80.203 Phlebitis and thrombophlebitis of unspecified deep vessels of lower extremities, bilateral Wound Cleansing Wound #3 Left,Proximal,Lateral Lower Leg o Clean wound with Normal Saline. Anesthetic (add to Medication List) Wound #3 Left,Proximal,Lateral Lower Leg o Topical Lidocaine 4% cream applied to wound bed prior to debridement (In Clinic Only). Skin Barriers/Peri-Wound Care Wound #3 Left,Proximal,Lateral Lower Leg o Moisturizing lotion Primary Wound Dressing o Other: - silvercell Secondary Dressing Wound #3 Left,Proximal,Lateral Lower Leg o XtraSorb Dressing Change Frequency Wound #3 Left,Proximal,Lateral Lower Leg o Three times weekly - once to be done in the clinic Follow-up Appointments Wound #3 Left,Proximal,Lateral Lower Leg o Return Appointment in 1 week. Edema Control o 3 Layer Compression System - Left Lower Extremity - may use unna boot to only anchor dressing from sliding Home Health Wound #3 Left,Proximal,Lateral Lower Leg o Initiate Home Health for Skilled Nursing o Home Health Nurse may visit PRN to address patientos wound care needs. o FACE TO FACE ENCOUNTER: MEDICARE and MEDICAID PATIENTS: I certify that this patient is under my care and that I had a face-to-face encounter that meets the physician face-to-face encounter requirements with this [name]. ([id number]) patient on this date. The encounter with the patient was in whole or in part for the following MEDICAL CONDITION: (primary reason for Home Healthcare) MEDICAL NECESSITY: I certify, that based on my findings, NURSING services are a medically necessary home health service. HOME BOUND STATUS: I certify that my clinical findings support that this patient is homebound (i.e., Due  to illness or injury, pt requires aid of supportive devices such as crutches, cane, wheelchairs, walkers, the use of special transportation or the assistance of another person to leave their place of residence. There is a normal  inability to leave the home and doing so requires considerable and taxing effort. Other absences are for medical reasons / religious services and are infrequent or of short duration when for other reasons). o If current dressing causes regression in wound condition, may D/C ordered dressing product/s and apply Normal Saline Moist Dressing daily until next Wound Healing Center / Other MD appointment. Notify Wound Healing Center of regression in wound condition at [phone]. o Please direct any NON-WOUND related issues/requests for orders to patient's Primary Care Physician Electronic Signature(s) Signed: 07/27/2017 4:08:26 PM By: Renne Crigler Signed: 07/27/2017 4:26:52 PM By: Lenda Kelp PA-C Entered By: Renne Crigler on 07/27/2017 15:57:47 Vidas, Zakayla J. ([id number]) -------------------------------------------------------------------------------- Problem List Details Patient Name: [place], [name]. Date of Service: 07/27/2017 2:45 PM Medical Record Number: [id number] Patient Account Number: [id number] Date of Birth/Sex: 10-09-1929 (84 y.o. Female) Treating RN: Renne Crigler Primary Care Provider: Etheleen Nicks Other Clinician: Referring Provider: Etheleen Nicks Treating Provider/Extender: Linwood Dibbles, Mikylah Ackroyd Weeks in Treatment: 0 Active Problems ICD-10 Encounter Code Description Active Date Diagnosis I89.0 Lymphedema, not elsewhere classified 07/21/2017 Yes L97.221 Non-pressure chronic ulcer of left calf limited to breakdown of skin 07/21/2017 Yes I80.203 Phlebitis and thrombophlebitis of unspecified deep vessels of lower 07/21/2017 Yes extremities, bilateral Inactive Problems Resolved Problems Electronic Signature(s) Signed: 07/27/2017 4:26:52 PM  By: Lenda Kelp PA-C Entered By: Lenda Kelp on 07/27/2017 15:31:42 [name], [name] J. ([id number]) -------------------------------------------------------------------------------- Progress Note Details Patient Name: [name]. Date of Service: 07/27/2017 2:45 PM Medical Record Number: [id number] Patient Account Number: [id number] Date of Birth/Sex: [birth year] (84 y.o. Female) Treating RN: Renne Crigler Primary Care Provider: Etheleen Nicks Other Clinician: Referring Provider: Etheleen Nicks Treating Provider/Extender: Linwood Dibbles, Kameron Blethen Weeks in Treatment: 0 Subjective Chief Complaint Information obtained from Patient Left lateral calf ulcer History of Present Illness (HPI) The following HPI elements were documented for the patient's wound: Modifying Factors: Other treatment(s) tried include:she has been wearing what sounds like lymphedema pumps for the last 12 years or so 84 year old patient who comes with bilateral lower extremity edema which she's had for at least 15 years and now an ulcerated area on the left lower extremity which she's had for a month. He was recently seen by the PCP who made a diagnosis of venous stasis ulcer of the left calf and referred her to the wound center. He was placed on clindamycin. Past medical history significant for colon cancer, history of DVT, left tibial fracture in 2012 which required surgery, hyperlipidemia and has been on warfarin for several years. She also gives a history of having lymph pumps and she has been wearing them for about 12 years. She is a former smoker and has quit about 10 years ago. 06/23/2016 -- she has not yet got her venous duplex study done but she did bring her lymphedema pumps and we have checked him out and they seem to be functioning fine 07/03/2016 -- a venous duplex study has not been scheduled for another month and I believe she has an appointment on January 28 07/31/2016 -- the patient continues to have  significant lymphedema and she does not have appropriate compression stockings with her at home Readmission: 07/21/17 on evaluation today patient presents for reevaluation concerning a small ulceration she has of the left lateral calf region. This has been present for a couple of weeks prior to initial evaluation here in the clinic. She has not been on any recent antibiotics although she has been treated for a deep vein thrombosis which  is almost completely resolved she is actually going to be seeing next month a vascular specialist at [facility]. Up to this point she has been managed mainly by her primary care provider according to patient. She does not have any significant discomfort which is good news. Unfortunately she is having some mild pain though note purulent discharge in the lateral calf wound. No fevers, chills, nausea, or vomiting noted at this time. 07/27/17 on evaluation today patient appears to be doing excellent in regard to her left lower extremity ulcer. This is not terribly smaller but it is doing much better there is less leaking from the wound and her leg seems to be much smaller after using the compression over the last week. She has not been using her lymphedema pumps due to the fact that that did bother her last week it was hurting and she even attempted to do this. The good news is that doesn't seem to be as bad at this point and I think we may be able to reinitiate the lymphedema pumps. Again we are attempting to get this ulcer closed and doing better so that she can proceed with treatment at the lymphedema clinic. Patient History Information obtained from Patient. Family History Cancer - Child, Diabetes - Child,Maternal Grandparents, Heart Disease - Maternal Grandparents, Kidney Disease - Maternal Naill, Alenah J. ([id number]) Grandparents, No family history of Hypertension, Lung Disease, Seizures, Stroke, Thyroid Problems, Tuberculosis. Social History Former smoker - 20 +  years ago, Marital Status - Married, Alcohol Use - Never, Drug Use - No History, Caffeine Use - Never. Medical History Hospitalization/Surgery History - 07/07/2005, [facility], Colon cancer. Review of Systems (ROS) Constitutional Symptoms (General Health) Denies complaints or symptoms of Fever, Chills. Respiratory The patient has no complaints or symptoms. Cardiovascular Complains or has symptoms of LE edema. Psychiatric The patient has no complaints or symptoms. Objective Constitutional Obese and well-hydrated in no acute distress. Vitals Time Taken: 3:12 AM, Height: 66 in, Temperature: 97.7 F, Pulse: 66 bpm, Respiratory Rate: 20 breaths/min, Blood Pressure: 146/100 mmHg. Respiratory normal breathing without difficulty. clear to auscultation bilaterally. Cardiovascular regular rate and rhythm with normal S1, S2. 1+ pitting edema of the bilateral lower extremities. Psychiatric this patient is able to make decisions and demonstrates good insight into disease process. Alert and Oriented x 3. pleasant and cooperative. General Notes: Patient's wound bed appears to be doing well there's no evidence of infection no significant erythema. She does have a little bit of weeping but overall this is very mild. Integumentary (Hair, Skin) Wound #3 status is Open. Original cause of wound was Gradually Appeared. The wound is located on the Left,Proximal,Lateral Lower Leg. The wound measures 0.2cm length x 0.2cm width x 0.1cm depth; 0.031cm^2 area and 0.003cm^3 volume. There is Fat Layer (Subcutaneous Tissue) Exposed exposed. There is no tunneling or undermining noted. There is a small amount of serous drainage noted. The wound margin is flat and intact. There is large (67-100%) pink granulation within the wound bed. There is a small (1-33%) amount of necrotic tissue within the wound bed including Adherent Slough. The periwound skin appearance did not exhibit: Callus, Crepitus, Excoriation, Induration,  Rash, Scarring, Evans, Joyel J. ([id number]) Dry/Scaly, Maceration, Atrophie Blanche, Cyanosis, Ecchymosis, Hemosiderin Staining, Mottled, Pallor, Rubor, Erythema. Assessment Active Problems ICD-10 I89.0 - Lymphedema, not elsewhere classified L97.221 - Non-pressure chronic ulcer of left calf limited to breakdown of skin I80.203 - Phlebitis and thrombophlebitis of unspecified deep vessels of lower extremities, bilateral Plan Wound Cleansing: Wound #3 Left,Proximal,Lateral Lower  Leg: Clean wound with Normal Saline. Anesthetic (add to Medication List): Wound #3 Left,Proximal,Lateral Lower Leg: Topical Lidocaine 4% cream applied to wound bed prior to debridement (In Clinic Only). Skin Barriers/Peri-Wound Care: Wound #3 Left,Proximal,Lateral Lower Leg: Moisturizing lotion Primary Wound Dressing: Other: - silvercell Secondary Dressing: Wound #3 Left,Proximal,Lateral Lower Leg: XtraSorb Dressing Change Frequency: Wound #3 Left,Proximal,Lateral Lower Leg: Three times weekly - once to be done in the clinic Follow-up Appointments: Wound #3 Left,Proximal,Lateral Lower Leg: Return Appointment in 1 week. Edema Control: 3 Layer Compression System - Left Lower Extremity - may use unna boot to only anchor dressing from sliding Home Health: Wound #3 Left,Proximal,Lateral Lower Leg: Initiate Home Health for Skilled Nursing Home Health Nurse may visit PRN to address patient s wound care needs. FACE TO FACE ENCOUNTER: MEDICARE and MEDICAID PATIENTS: I certify that this patient is under my care and that I had a face-to-face encounter that meets the physician face-to-face encounter requirements with this patient on this date. The encounter with the patient was in whole or in part for the following MEDICAL CONDITION: (primary reason for Home Healthcare) MEDICAL NECESSITY: I certify, that based on my findings, NURSING services are a medically necessary home health service. HOME BOUND STATUS: I certify  that my clinical findings support that this patient is homebound (i.e., Due to illness or injury, pt requires aid of supportive devices such as crutches, cane, wheelchairs, walkers, the use of special transportation or the assistance of another person to leave their place of residence. There is a normal inability to leave the home and doing so requires considerable and taxing effort. Other absences are for medical reasons / religious services and are infrequent or of short duration when for other reasons). If current dressing causes regression in wound condition, may D/C ordered dressing product/s and apply Normal Saline Moist Dressing daily until next Wound Healing Center / Other MD appointment. Notify Wound Healing Center of regression in Brand[place]ITH, [place] J. ([id number]) wound condition at [phone]. Please direct any NON-WOUND related issues/requests for orders to patient's Primary Care Physician We are going to continue with the Current wound care measures for the next week. Patient is going to initiate the lymphedema pumps on this left leg as well and see if this would be of benefit I think that is excellent as far as ideas are concerned. We will see were things stand in one weeks time. Please see above for specific wound care orders. We will see patient for re-evaluation in 1 week(s) here in the clinic. If anything worsens or changes patient will contact our office for additional recommendations. Electronic Signature(s) Signed: 07/27/2017 4:26:52 PM By: Lenda KelpStone III, Novalee Horsfall PA-C Entered By: Lenda KelpStone III, Yichen Gilardi on 07/27/2017 16:09:27 [name], [name] [name] CommonsJ. ([id number]) -------------------------------------------------------------------------------- ROS/PFSH Details Patient Name: MillingtonSMITH, [name] Date of Service: 07/27/2017 2:45 PM Medical Record Number: [id number] Patient Account Number: [id number] Date of Birth/Sex: 05/30/1930 (84 y.o. Female) Treating RN: Renne CriglerFlinchum, Cheryl Primary Care Provider: Etheleen NicksMACDONALD, KERI  Other Clinician: Referring Provider: Etheleen NicksMACDONALD, KERI Treating Provider/Extender: Linwood DibblesSTONE III, Windel Keziah Weeks in Treatment: 0 Information Obtained From Patient Wound History Do you currently have one or more open woundso Yes How many open wounds do you currently haveo 1 Approximately how long have you had your woundso 2 months Has your wound(s) ever healed and then re-openedo No Have you had any lab work done in the past montho No Have you tested positive for an antibiotic resistant organism (MRSA, VRE)o No Have you tested positive for osteomyelitis (bone infection)o No Have  you had any tests for circulation on your legso No Constitutional Symptoms (General Health) Complaints and Symptoms: Negative for: Fever; Chills Cardiovascular Complaints and Symptoms: Positive for: LE edema Medical History: Positive for: Deep Vein Thrombosis - lower extremties bilaterally Negative for: Angina; Arrhythmia; Congestive Heart Failure; Coronary Artery Disease; Hypertension; Hypotension; Myocardial Infarction; Peripheral Arterial Disease; Peripheral Venous Disease; Phlebitis; Vasculitis Eyes Medical History: Negative for: Cataracts; Glaucoma; Optic Neuritis Ear/Nose/Mouth/Throat Medical History: Negative for: Chronic sinus problems/congestion; Middle ear problems Hematologic/Lymphatic Medical History: Positive for: Lymphedema Negative for: Anemia; Hemophilia; Human Immunodeficiency Virus; Sickle Cell Disease Respiratory Complaints and Symptoms: No Complaints or Symptoms Medical History: Positive for: Asthma - controlled; Chronic Obstructive Pulmonary Disease (COPD) Delong, Kiala J. ([id number]) Negative for: Aspiration; Pneumothorax; Sleep Apnea; Tuberculosis Gastrointestinal Medical History: Negative for: Cirrhosis ; Colitis; Crohnos; Hepatitis A; Hepatitis B Endocrine Medical History: Negative for: Type I Diabetes; Type II Diabetes Genitourinary Medical History: Negative for: End Stage Renal  Disease Immunological Medical History: Negative for: Lupus Erythematosus; Raynaudos; Scleroderma Integumentary (Skin) Medical History: Negative for: History of Burn; History of pressure wounds Musculoskeletal Medical History: Negative for: Gout; Rheumatoid Arthritis; Osteoarthritis; Osteomyelitis Neurologic Medical History: Negative for: Dementia; Neuropathy; Quadriplegia; Paraplegia; Seizure Disorder Oncologic Medical History: Positive for: Received Chemotherapy - 2007; Received Radiation - 2007 Psychiatric Complaints and Symptoms: No Complaints or Symptoms Medical History: Negative for: Anorexia/bulimia; Confinement Anxiety Immunizations Pneumococcal Vaccine: Received Pneumococcal Vaccination: Yes Implantable Devices Hospitalization / Surgery History Name of Hospital Purpose of Hospitalization/Surgery Date [facility] Colon cancer 07/07/2005 JADE, BURKARD ([id number]) Family and Social History Cancer: Yes - Child; Diabetes: Yes - Child,Maternal Grandparents; Heart Disease: Yes - Maternal Grandparents; Hypertension: No; Kidney Disease: Yes - Maternal Grandparents; Lung Disease: No; Seizures: No; Stroke: No; Thyroid Problems: No; Tuberculosis: No; Former smoker - 20 + years ago; Marital Status - Married; Alcohol Use: Never; Drug Use: No History; Caffeine Use: Never; Financial Concerns: No; Food, Clothing or Shelter Needs: No; Support System Lacking: No; Transportation Concerns: No; Advanced Directives: Yes (Not Provided); Patient does not want information on Advanced Directives; Living Will: Yes (Not Provided) Physician Affirmation I have reviewed and agree with the above information. Electronic Signature(s) Signed: 07/27/2017 4:08:26 PM By: Renne Crigler Signed: 07/27/2017 4:26:52 PM By: Lenda Kelp PA-C Entered By: Lenda Kelp on 07/27/2017 16:08:03 Fines, Shantana [name] ([id number]) -------------------------------------------------------------------------------- SuperBill  Details Patient Name: Alexander, [name]. Date of Service: 07/27/2017 Medical Record Number: [id number] Patient Account Number: [id number] Date of Birth/Sex: 30-Sep-1929 (84 y.o. Female) Treating RN: Renne Crigler Primary Care Provider: Etheleen Nicks Other Clinician: Referring Provider: Etheleen Nicks Treating Provider/Extender: Linwood Dibbles, Matias Thurman Weeks in Treatment: 0 Diagnosis Coding ICD-10 Codes Code Description I89.0 Lymphedema, not elsewhere classified L97.221 Non-pressure chronic ulcer of left calf limited to breakdown of skin I80.203 Phlebitis and thrombophlebitis of unspecified deep vessels of lower extremities, bilateral Facility Procedures CPT4 Code: 62952841 Description: 32440 - WOUND CARE VISIT-LEV 2 EST PT Modifier: Quantity: 1 Physician Procedures CPT4 Code Description: [id number] 66440 - WC PHYS LEVEL 3 - EST PT ICD-10 Diagnosis Description I89.0 Lymphedema, not elsewhere classified 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

## 2017-08-03 ENCOUNTER — Encounter: Payer: Medicare Other | Admitting: Physician Assistant

## 2017-08-03 DIAGNOSIS — I80203 Phlebitis and thrombophlebitis of unspecified deep vessels of lower extremities, bilateral: Secondary | ICD-10-CM | POA: Diagnosis not present

## 2017-08-04 NOTE — Progress Notes (Signed)
DIVINITY, KYLER ([id number]) Visit Report for 08/03/2017 Arrival Information Details Patient Name: [place], [name] Date of Service: 08/03/2017 8:45 AM Medical Record Number: [id number] Patient Account Number: [id number] Date of Birth/Sex: 1930-06-18 (84 y.o. Female) Treating RN: Ashok Cordia, Debi Primary Care Andre Gallego: Etheleen Nicks Other Clinician: Referring Corlette Ciano: Etheleen Nicks Treating Janielle Mittelstadt/Extender: Linwood Dibbles, HOYT Weeks in Treatment: 1 Visit Information History Since Last Visit All ordered tests and consults were completed: No Patient Arrived: Wheel Chair Added or deleted any medications: No Arrival Time: 09:43 Any new allergies or adverse reactions: No Accompanied By: son Had a fall or experienced change in No Transfer Assistance: EasyPivot Patient activities of daily living that may affect Lift risk of falls: Patient Identification Verified: Yes Signs or symptoms of abuse/neglect since last visito No Secondary Verification Process Yes Hospitalized since last visit: No Completed: Has Dressing in Place as Prescribed: Yes Patient Requires Transmission-Based No Precautions: Has Compression in Place as Prescribed: Yes Patient Has Alerts: No Pain Present Now: No Electronic Signature(s) Signed: 08/03/2017 5:10:21 PM By: Alejandro Mulling Entered By: Alejandro Mulling on 08/03/2017 09:44:21 Gabrys, Robertha J. ([id number]) -------------------------------------------------------------------------------- Clinic Level of Care Assessment Details Patient Name: [place], [name] Date of Service: 08/03/2017 8:45 AM Medical Record Number: [id number] Patient Account Number: [id number] Date of Birth/Sex: 11/15/1929 (84 y.o. Female) Treating RN: Renne Crigler Primary Care Ananth Fiallos: Etheleen Nicks Other Clinician: Referring Amira Podolak: Etheleen Nicks Treating Ashiya Kinkead/Extender: Linwood Dibbles, HOYT Weeks in Treatment: 1 Clinic Level of Care Assessment Items TOOL 4 Quantity Score []  - Use when  only an EandM is performed on FOLLOW-UP visit 0 ASSESSMENTS - Nursing Assessment / Reassessment X - Reassessment of Co-morbidities (includes updates in patient status) 1 10 X- 1 5 Reassessment of Adherence to Treatment Plan ASSESSMENTS - Wound and Skin Assessment / Reassessment X - Simple Wound Assessment / Reassessment - one wound 1 5 []  - 0 Complex Wound Assessment / Reassessment - multiple wounds X- 1 10 Dermatologic / Skin Assessment (not related to wound area) ASSESSMENTS - Focused Assessment []  - Circumferential Edema Measurements - multi extremities 0 []  - 0 Nutritional Assessment / Counseling / Intervention []  - 0 Lower Extremity Assessment (monofilament, tuning fork, pulses) []  - 0 Peripheral Arterial Disease Assessment (using hand held doppler) ASSESSMENTS - Ostomy and/or Continence Assessment and Care []  - Incontinence Assessment and Management 0 []  - 0 Ostomy Care Assessment and Management (repouching, etc.) PROCESS - Coordination of Care X - Simple Patient / Family Education for ongoing care 1 15 []  - 0 Complex (extensive) Patient / Family Education for ongoing care []  - 0 Staff obtains Chiropractor, Records, Test Results / Process Orders []  - 0 Staff telephones HHA, Nursing Homes / Clarify orders / etc []  - 0 Routine Transfer to another Facility (non-emergent condition) []  - 0 Routine Hospital Admission (non-emergent condition) []  - 0 New Admissions / Manufacturing engineer / Ordering NPWT, Apligraf, etc. []  - 0 Emergency Hospital Admission (emergent condition) X- 1 10 Simple Discharge Coordination [place], Keosha J. ([id number]) []  - 0 Complex (extensive) Discharge Coordination PROCESS - Special Needs []  - Pediatric / Minor Patient Management 0 []  - 0 Isolation Patient Management []  - 0 Hearing / Language / Visual special needs []  - 0 Assessment of Community assistance (transportation, D/C planning, etc.) []  - 0 Additional assistance / Altered  mentation []  - 0 Support Surface(s) Assessment (bed, cushion, seat, etc.) INTERVENTIONS - Wound Cleansing / Measurement X - Simple Wound Cleansing - one wound 1 5 []  - 0 Complex Wound Cleansing -  multiple wounds X- 1 5 Wound Imaging (photographs - any number of wounds) []  - 0 Wound Tracing (instead of photographs) X- 1 5 Simple Wound Measurement - one wound []  - 0 Complex Wound Measurement - multiple wounds INTERVENTIONS - Wound Dressings X - Small Wound Dressing one or multiple wounds 1 10 []  - 0 Medium Wound Dressing one or multiple wounds []  - 0 Large Wound Dressing one or multiple wounds []  - 0 Application of Medications - topical []  - 0 Application of Medications - injection INTERVENTIONS - Miscellaneous []  - External ear exam 0 []  - 0 Specimen Collection (cultures, biopsies, blood, body fluids, etc.) []  - 0 Specimen(s) / Culture(s) sent or taken to Lab for analysis []  - 0 Patient Transfer (multiple staff / Nurse, adultHoyer Lift / Similar devices) []  - 0 Simple Staple / Suture removal (25 or less) []  - 0 Complex Staple / Suture removal (26 or more) []  - 0 Hypo / Hyperglycemic Management (close monitor of Blood Glucose) []  - 0 Ankle / Brachial Index (ABI) - do not check if billed separately X- 1 5 Vital Signs Diegel, Daissy J. ([id number]) Has the patient been seen at the hospital within the last three years: Yes Total Score: 85 Level Of Care: New/Established - Level 3 Electronic Signature(s) Signed: 08/03/2017 12:34:10 PM By: Renne CriglerFlinchum, Cheryl Entered By: Renne CriglerFlinchum, Cheryl on 08/03/2017 10:30:15 Rodick, Lidia [name] ([id number]) -------------------------------------------------------------------------------- Encounter Discharge Information Details Patient Name: [place] L[place] [name] Date of Service: 08/03/2017 8:45 AM Medical Record Number: [id number] Patient Account Number: [id number] Date of Birth/Sex: [birth year] (84 y.o. Female) Treating RN: Renne CriglerFlinchum, Cheryl Primary Care Martha Soltys:  Etheleen NicksMACDONALD, KERI Other Clinician: Referring Brendolyn Stockley: Etheleen NicksMACDONALD, KERI Treating Graeme Menees/Extender: Linwood DibblesSTONE III, HOYT Weeks in Treatment: 1 Encounter Discharge Information Items Discharge Pain Level: 0 Discharge Condition: Stable Ambulatory Status: Wheelchair Discharge Destination: Home Transportation: Private Auto Accompanied By: son Schedule Follow-up Appointment: Yes Medication Reconciliation completed and No provided to Patient/Care Tanisha Lutes: Provided on Clinical Summary of Care: 08/03/2017 Form Type Recipient Paper Patient IS Electronic Signature(s) Signed: 08/03/2017 12:34:10 PM By: Renne CriglerFlinchum, Cheryl Entered By: Renne CriglerFlinchum, Cheryl on 08/03/2017 10:31:21 [place], Lidia [name] ([id number]) -------------------------------------------------------------------------------- Lower Extremity Assessment Details Patient Name: [place] L[place] [name] Date of Service: 08/03/2017 8:45 AM Medical Record Number: [id number] Patient Account Number: [id number] Date of Birth/Sex: [birth year] (84 y.o. Female) Treating RN: Renne CriglerFlinchum, Cheryl Primary Care Ladeja Pelham: Etheleen NicksMACDONALD, KERI Other Clinician: Referring Shannel Zahm: Etheleen NicksMACDONALD, KERI Treating Herberta Pickron/Extender: Linwood DibblesSTONE III, HOYT Weeks in Treatment: 1 Edema Assessment Assessed: [Left: No] [Right: No] Edema: [Left: Ye] [Right: s] Calf Left: Right: Point of Measurement: 35 cm From Medial Instep 48.8 cm cm Ankle Left: Right: Point of Measurement: 12 cm From Medial Instep 27 cm cm Vascular Assessment Claudication: Claudication Assessment [Left:None] Pulses: Dorsalis Pedis Palpable: [Left:Yes] Posterior Tibial Extremity colors, hair growth, and conditions: Extremity Color: [Left:Normal] Hair Growth on Extremity: [Left:No] Temperature of Extremity: [Left:Warm] Capillary Refill: [Left:< 3 seconds] Toe Nail Assessment Left: Right: Thick: Yes Discolored: Yes Deformed: No Improper Length and Hygiene: Yes Electronic Signature(s) Signed: 08/03/2017 12:34:10 PM By:  Renne CriglerFlinchum, Cheryl Entered By: Renne CriglerFlinchum, Cheryl on 08/03/2017 09:58:19 Salvatierra, Chae J. ([id number]) -------------------------------------------------------------------------------- Multi Wound Chart Details Patient Name: [place] L[place] [name] Date of Service: 08/03/2017 8:45 AM Medical Record Number: [id number] Patient Account Number: [id number] Date of Birth/Sex: [birth year] (84 y.o. Female) Treating RN: Renne CriglerFlinchum, Cheryl Primary Care Lataisha Colan: Etheleen NicksMACDONALD, KERI Other Clinician: Referring Ellorie Kindall: Etheleen NicksMACDONALD, KERI Treating Redford Behrle/Extender: Linwood DibblesSTONE III, HOYT Weeks in Treatment: 1 Vital Signs Height(in): 66 Pulse(bpm): 75 Weight(lbs): Blood Pressure(mmHg): 129/58 Body Mass Index(BMI): Temperature(F):  97.8 Respiratory Rate 20 (breaths/min): Photos: [3:No Photos] [N/A:N/A] Wound Location: [3:Left Lower Leg - Lateral, Proximal] [N/A:N/A] Wounding Event: [3:Gradually Appeared] [N/A:N/A] Primary Etiology: [3:Lymphedema] [N/A:N/A] Comorbid History: [3:Lymphedema, Asthma, Chronic Obstructive Pulmonary Disease (COPD), Deep Vein Thrombosis, Received Chemotherapy, Received Radiation] [N/A:N/A] Date Acquired: [3:05/21/2017] [N/A:N/A] Weeks of Treatment: [3:1] [N/A:N/A] Wound Status: [3:Healed - Epithelialized] [N/A:N/A] Measurements L x W x D [3:0x0x0] [N/A:N/A] (cm) Area (cm) : [3:0] [N/A:N/A] Volume (cm) : [3:0] [N/A:N/A] % Reduction in Area: [3:100.00%] [N/A:N/A] % Reduction in Volume: [3:100.00%] [N/A:N/A] Classification: [3:Partial Thickness] [N/A:N/A] Exudate Amount: [3:None Present] [N/A:N/A] Granulation Amount: [3:Large (67-100%)] [N/A:N/A] Necrotic Amount: [3:None Present (0%)] [N/A:N/A] Epithelialization: [3:Large (67-100%)] [N/A:N/A] Periwound Skin Texture: [3:No Abnormalities Noted] [N/A:N/A] Periwound Skin Moisture: [3:No Abnormalities Noted] [N/A:N/A] Periwound Skin Color: [3:No Abnormalities Noted No] [N/A:N/A N/A] Treatment Notes Electronic Signature(s) Signed: 08/03/2017  12:34:10 PM By: Renne Crigler Entered By: Renne Crigler on 08/03/2017 10:29:02 [name] ([id number]) Katrinka Blazing, Sherra J. ([id number]) -------------------------------------------------------------------------------- Multi-Disciplinary Care Plan Details Patient Name: [place], [name] Date of Service: 08/03/2017 8:45 AM Medical Record Number: [id number] Patient Account Number: [id number] Date of Birth/Sex: [birth year] (84 y.o. Female) Treating RN: Renne Crigler Primary Care Cashae Weich: Etheleen Nicks Other Clinician: Referring Guneet Delpino: Etheleen Nicks Treating Lazara Grieser/Extender: Linwood Dibbles, HOYT Weeks in Treatment: 1 Active Inactive ` Orientation to the Wound Care Program Nursing Diagnoses: Knowledge deficit related to the wound healing center program Goals: Patient/caregiver will verbalize understanding of the Wound Healing Center Program Date Initiated: 07/21/2017 Target Resolution Date: 08/11/2017 Goal Status: Active Interventions: Provide education on orientation to the wound center Notes: ` Wound/Skin Impairment Nursing Diagnoses: Impaired tissue integrity Knowledge deficit related to ulceration/compromised skin integrity Goals: Patient/caregiver will verbalize understanding of skin care regimen Date Initiated: 07/21/2017 Target Resolution Date: 08/11/2017 Goal Status: Active Ulcer/skin breakdown will have a volume reduction of 30% by week 4 Date Initiated: 07/21/2017 Target Resolution Date: 08/11/2017 Goal Status: Active Interventions: Assess patient/caregiver ability to obtain necessary supplies Assess patient/caregiver ability to perform ulcer/skin care regimen upon admission and as needed Assess ulceration(s) every visit Treatment Activities: Skin care regimen initiated : 07/21/2017 Notes: Electronic Signature(s) Signed: 08/03/2017 12:34:10 PM By: Sharlotte Alamo, Jasmarie J. ([id number]) Entered By: Renne Crigler on 08/03/2017 10:25:43 Amico, Amana J.  ([id number]) -------------------------------------------------------------------------------- Non-Wound Condition Assessment Details Patient Name: [place], [name] Date of Service: 08/03/2017 8:45 AM Medical Record Number: [id number] Patient Account Number: [id number] Date of Birth/Sex: 01/12/1930 (84 y.o. Female) Treating RN: Renne Crigler Primary Care Odell Choung: Etheleen Nicks Other Clinician: Referring Shrihan Putt: Etheleen Nicks Treating Faatimah Spielberg/Extender: STONE III, HOYT Weeks in Treatment: 1 Non-Wound Condition: Condition: Lymphedema Location: Leg Side: Left Periwound Skin Texture Texture Color No Abnormalities Noted: No No Abnormalities Noted: No Callus: No Atrophie Blanche: No Crepitus: No Cyanosis: No Excoriation: No Ecchymosis: No Friable: No Erythema: No Induration: No Hemosiderin Staining: No Rash: No Mottled: No Scarring: No Pallor: No Rubor: No Moisture No Abnormalities Noted: No Temperature / Pain Dry / Scaly: No Temperature: No Abnormality Maceration: No Electronic Signature(s) Signed: 08/03/2017 12:34:10 PM By: Renne Crigler Entered By: Renne Crigler on 08/03/2017 10:06:41 Kerkhoff, Yun J. ([id number]) -------------------------------------------------------------------------------- Pain Assessment Details Patient Name: [place], [name] Date of Service: 08/03/2017 8:45 AM Medical Record Number: [id number] Patient Account Number: [id number] Date of Birth/Sex: 1930/04/24 (84 y.o. Female) Treating RN: Ashok Cordia, Debi Primary Care Crayton Savarese: Etheleen Nicks Other Clinician: Referring Juda Lajeunesse: Etheleen Nicks Treating Sonu Kruckenberg/Extender: Linwood Dibbles, HOYT Weeks in Treatment: 1 Active Problems Location of Pain Severity and Description of Pain Patient Has Paino No Site Locations  Pain Management and Medication Current Pain Management: Electronic Signature(s) Signed: 08/03/2017 5:10:21 PM By: Alejandro Mulling Entered By: Alejandro Mulling on 08/03/2017  09:44:29 Winger, Hisae J. ([id number]) -------------------------------------------------------------------------------- Patient/Caregiver Education Details Patient Name: [place], [name] Date of Service: 08/03/2017 8:45 AM Medical Record Number: [id number] Patient Account Number: [id number] Date of Birth/Gender: 06-Oct-1929 (84 y.o. Female) Treating RN: Renne Crigler Primary Care Physician: Etheleen Nicks Other Clinician: Referring Physician: Etheleen Nicks Treating Physician/Extender: Skeet Simmer in Treatment: 1 Education Assessment Education Provided To: Patient Education Topics Provided Wound/Skin Impairment: Handouts: Skin Care Do's and Dont's Methods: Explain/Verbal Responses: State content correctly Electronic Signature(s) Signed: 08/03/2017 12:34:10 PM By: Renne Crigler Entered By: Renne Crigler on 08/03/2017 10:31:33 [place], Ellagrace J. ([id number]) -------------------------------------------------------------------------------- Wound Assessment Details Patient Name: [place], [name] Date of Service: 08/03/2017 8:45 AM Medical Record Number: [id number] Patient Account Number: [id number] Date of Birth/Sex: 13-Jan-1930 (84 y.o. Female) Treating RN: Renne Crigler Primary Care Maly Lemarr: Etheleen Nicks Other Clinician: Referring Angline Schweigert: Etheleen Nicks Treating Evalyse Stroope/Extender: Linwood Dibbles, HOYT Weeks in Treatment: 1 Wound Status Wound Number: 3 Primary Lymphedema Etiology: Wound Location: Left Lower Leg - Lateral, Proximal Wound Healed - Epithelialized Wounding Event: Gradually Appeared Status: Date Acquired: 05/21/2017 Comorbid Lymphedema, Asthma, Chronic Obstructive Weeks Of Treatment: 1 History: Pulmonary Disease (COPD), Deep Vein Clustered Wound: No Thrombosis, Received Chemotherapy, Received Radiation Photos Photo Uploaded By: Renne Crigler on 08/03/2017 12:33:07 Wound Measurements Length: (cm) 0 % R Width: (cm) 0 % R Depth: (cm) 0 Epi Area:  (cm) 0 Tu Volume: (cm) 0 Un eduction in Area: 100% eduction in Volume: 100% thelialization: Large (67-100%) nneling: No dermining: No Wound Description Classification: Partial Thickness Exudate Amount: None Present Foul Odor After Cleansing: No Slough/Fibrino No Wound Bed Granulation Amount: Large (67-100%) Necrotic Amount: None Present (0%) Periwound Skin Texture Texture Color No Abnormalities Noted: No No Abnormalities Noted: No Moisture No Abnormalities Noted: No MAIZEE, REINHOLD ([id number]) Electronic Signature(s) Signed: 08/03/2017 12:34:10 PM By: Renne Crigler Entered By: Renne Crigler on 08/03/2017 10:06:00 Egelhoff, [name] ([id number]) -------------------------------------------------------------------------------- Vitals Details Patient Name: [place], [name] Date of Service: 08/03/2017 8:45 AM Medical Record Number: [id number] Patient Account Number: [id number] Date of Birth/Sex: 1930-05-15 (84 y.o. Female) Treating RN: Ashok Cordia, Debi Primary Care Harun Brumley: Etheleen Nicks Other Clinician: Referring Vasiliki Smaldone: Etheleen Nicks Treating Lakeithia Rasor/Extender: Linwood Dibbles, HOYT Weeks in Treatment: 1 Vital Signs Time Taken: 09:44 Temperature (F): 97.8 Height (in): 66 Pulse (bpm): 75 Respiratory Rate (breaths/min): 20 Blood Pressure (mmHg): 129/58 Reference Range: 80 - 120 mg / dl Electronic Signature(s) Signed: 08/03/2017 5:10:21 PM By: Alejandro Mulling Entered By: Alejandro Mulling on 08/03/2017 09:46:32

## 2017-08-04 NOTE — Progress Notes (Signed)
MEHAK, ROSKELLEY ([id number]) Visit Report for 08/03/2017 Chief Complaint Document Details Patient Name: [name], [name]. Date of Service: 08/03/2017 8:45 AM Medical Record Number: [id number] Patient Account Number: [id number] Date of Birth/Sex: Jan 28, 1930 (84 y.o. Female) Treating RN: Renne Crigler Primary Care Provider: Etheleen Nicks Other Clinician: Referring Provider: Etheleen Nicks Treating Provider/Extender: Linwood Dibbles, HOYT Weeks in Treatment: 1 Information Obtained from: Patient Chief Complaint Left lateral calf ulcer Electronic Signature(s) Signed: 08/03/2017 1:29:11 PM By: Lenda Kelp PA-C Entered By: Lenda Kelp on 08/03/2017 09:34:57 Gordon, [name] J. ([id number]) -------------------------------------------------------------------------------- HPI Details Patient Name: [name]. Date of Service: 08/03/2017 8:45 AM Medical Record Number: [id number] Patient Account Number: [id number] Date of Birth/Sex: 1930/04/30 (84 y.o. Female) Treating RN: Renne Crigler Primary Care Provider: Etheleen Nicks Other Clinician: Referring Provider: Etheleen Nicks Treating Provider/Extender: Linwood Dibbles, HOYT Weeks in Treatment: 1 History of Present Illness Modifying Factors: Other treatment(s) tried include:she has been wearing what sounds like lymphedema pumps for the last 12 years or so HPI Description: 84 year old patient who comes with bilateral lower extremity edema which she's had for at least 15 years and now an ulcerated area on the left lower extremity which she's had for a month. He was recently seen by the PCP who made a diagnosis of venous stasis ulcer of the left calf and referred her to the wound center. He was placed on clindamycin. Past medical history significant for colon cancer, history of DVT, left tibial fracture in 2012 which required surgery, hyperlipidemia and has been on warfarin for several years. She also gives a history of having lymph pumps and she has  been wearing them for about 12 years. She is a former smoker and has quit about 10 years ago. 06/23/2016 -- she has not yet got her venous duplex study done but she did bring her lymphedema pumps and we have checked him out and they seem to be functioning fine 07/03/2016 -- a venous duplex study has not been scheduled for another month and I believe she has an appointment on January 28 07/31/2016 -- the patient continues to have significant lymphedema and she does not have appropriate compression stockings with her at home Readmission: 07/21/17 on evaluation today patient presents for reevaluation concerning a small ulceration she has of the left lateral calf region. This has been present for a couple of weeks prior to initial evaluation here in the clinic. She has not been on any recent antibiotics although she has been treated for a deep vein thrombosis which is almost completely resolved she is actually going to be seeing next month a vascular specialist at [facility]. Up to this point she has been managed mainly by her primary care provider according to patient. She does not have any significant discomfort which is good news. Unfortunately she is having some mild pain though note purulent discharge in the lateral calf wound. No fevers, chills, nausea, or vomiting noted at this time. 07/27/17 on evaluation today patient appears to be doing excellent in regard to her left lower extremity ulcer. This is not terribly smaller but it is doing much better there is less leaking from the wound and her leg seems to be much smaller after using the compression over the last week. She has not been using her lymphedema pumps due to the fact that that did bother her last week it was hurting and she even attempted to do this. The good news is that doesn't seem to be as bad at this point and I  think we may be able to reinitiate the lymphedema pumps. Again we are attempting to get this ulcer closed and doing better so  that she can proceed with treatment at the lymphedema clinic. 08/03/17 on evaluation today patient's wounds have completely closed including the small area on the left posterior aspect of her calf which we have been watching. She still has significant lymphedema unfortunately but the good news is there are no open ulcers. She is pleased about this. We do need to get her an appointment with the lymphedema clinic. Electronic Signature(s) Signed: 08/03/2017 1:29:11 PM By: Lenda Kelp PA-C Entered By: Lenda Kelp on 08/03/2017 13:26:37 [name], [name] ([id number]) -------------------------------------------------------------------------------- Physical Exam Details Patient Name: [name]. Date of Service: 08/03/2017 8:45 AM Medical Record Number: [id number] Patient Account Number: [id number] Date of Birth/Sex: June 13, 1930 (84 y.o. Female) Treating RN: Renne Crigler Primary Care Provider: Etheleen Nicks Other Clinician: Referring Provider: Etheleen Nicks Treating Provider/Extender: Linwood Dibbles, HOYT Weeks in Treatment: 1 Constitutional Obese and well-hydrated in no acute distress. Respiratory normal breathing without difficulty. clear to auscultation bilaterally. Cardiovascular regular rate and rhythm with normal S1, S2. 1+ pitting edema of the bilateral lower extremities. Psychiatric this patient is able to make decisions and demonstrates good insight into disease process. Alert and Oriented x 3. pleasant and cooperative. Notes There was no wound opening at this point during my evaluation today. Patient is having no significant discomfort which is also good news. She does continue to have significant bilateral lower extremity lymphedema. Electronic Signature(s) Signed: 08/03/2017 1:29:11 PM By: Lenda Kelp PA-C Entered By: Lenda Kelp on 08/03/2017 13:27:21 [name], [name]  ([id number]) -------------------------------------------------------------------------------- Physician Orders Details Patient Name: [name]. Date of Service: 08/03/2017 8:45 AM Medical Record Number: [id number] Patient Account Number: [id number] Date of Birth/Sex: Dec 03, 1929 (84 y.o. Female) Treating RN: Renne Crigler Primary Care Provider: Etheleen Nicks Other Clinician: Referring Provider: Etheleen Nicks Treating Provider/Extender: Linwood Dibbles, HOYT Weeks in Treatment: 1 Verbal / Phone Orders: No Diagnosis Coding ICD-10 Coding Code Description I89.0 Lymphedema, not elsewhere classified L97.221 Non-pressure chronic ulcer of left calf limited to breakdown of skin I80.203 Phlebitis and thrombophlebitis of unspecified deep vessels of lower extremities, bilateral Dressing Change Frequency o Change dressing every week Edema Control o 3 Layer Compression System - Left Lower Extremity - may use unna boot to only anchor dressing from sliding o Compression Pump: Use compression pump on right lower extremity for 30 minutes, twice daily. Home Health o D/C Home Health Services - treatment completed Services and Therapies o Lymphedema Clinic Electronic Signature(s) Signed: 08/03/2017 12:34:10 PM By: Renne Crigler Signed: 08/03/2017 1:29:11 PM By: Lenda Kelp PA-C Entered By: Renne Crigler on 08/03/2017 11:54:26 Mcneil, Seleste [name] ([id number]) -------------------------------------------------------------------------------- Problem List Details Patient Name: [place], [place] Gordon. Date of Service: 08/03/2017 8:45 AM Medical Record Number: [id number] Patient Account Number: [id number] Date of Birth/Sex: October 10, 1929 (84 y.o. Female) Treating RN: Renne Crigler Primary Care Provider: Etheleen Nicks Other Clinician: Referring Provider: Etheleen Nicks Treating Provider/Extender: Linwood Dibbles, HOYT Weeks in Treatment: 1 Active Problems ICD-10 Encounter Code Description Active  Date Diagnosis I89.0 Lymphedema, not elsewhere classified 07/21/2017 Yes L97.221 Non-pressure chronic ulcer of left calf limited to breakdown of skin 07/21/2017 Yes I80.203 Phlebitis and thrombophlebitis of unspecified deep vessels of lower 07/21/2017 Yes extremities, bilateral Inactive Problems Resolved Problems Electronic Signature(s) Signed: 08/03/2017 1:29:11 PM By: Lenda Kelp PA-C Entered By: Lenda Kelp on 08/03/2017 09:34:49 Pett[place], Cassey J. ([id number]) -------------------------------------------------------------------------------- Progress Note Details Patient  Name: [name], [name] J. Date of Service: 08/03/2017 8:45 AM Medical Record Number: [id number] Patient Account Number: [id number] Date of Birth/Sex: [birth year] (84 y.o. Female) Treating RN: Renne Crigler Primary Care Provider: Etheleen Nicks Other Clinician: Referring Provider: Etheleen Nicks Treating Provider/Extender: Linwood Dibbles, HOYT Weeks in Treatment: 1 Subjective Chief Complaint Information obtained from Patient Left lateral calf ulcer History of Present Illness (HPI) The following HPI elements were documented for the patient's wound: Modifying Factors: Other treatment(s) tried include:she has been wearing what sounds like lymphedema pumps for the last 12 years or so 84 year old patient who comes with bilateral lower extremity edema which she's had for at least 15 years and now an ulcerated area on the left lower extremity which she's had for a month. He was recently seen by the PCP who made a diagnosis of venous stasis ulcer of the left calf and referred her to the wound center. He was placed on clindamycin. Past medical history significant for colon cancer, history of DVT, left tibial fracture in 2012 which required surgery, hyperlipidemia and has been on warfarin for several years. She also gives a history of having lymph pumps and she has been wearing them for about 12 years. She is a former smoker and  has quit about 10 years ago. 06/23/2016 -- she has not yet got her venous duplex study done but she did bring her lymphedema pumps and we have checked him out and they seem to be functioning fine 07/03/2016 -- a venous duplex study has not been scheduled for another month and I believe she has an appointment on January 28 07/31/2016 -- the patient continues to have significant lymphedema and she does not have appropriate compression stockings with her at home Readmission: 07/21/17 on evaluation today patient presents for reevaluation concerning a small ulceration she has of the left lateral calf region. This has been present for a couple of weeks prior to initial evaluation here in the clinic. She has not been on any recent antibiotics although she has been treated for a deep vein thrombosis which is almost completely resolved she is actually going to be seeing next month a vascular specialist at [facility]. Up to this point she has been managed mainly by her primary care provider according to patient. She does not have any significant discomfort which is good news. Unfortunately she is having some mild pain though note purulent discharge in the lateral calf wound. No fevers, chills, nausea, or vomiting noted at this time. 07/27/17 on evaluation today patient appears to be doing excellent in regard to her left lower extremity ulcer. This is not terribly smaller but it is doing much better there is less leaking from the wound and her leg seems to be much smaller after using the compression over the last week. She has not been using her lymphedema pumps due to the fact that that did bother her last week it was hurting and she even attempted to do this. The good news is that doesn't seem to be as bad at this point and I think we may be able to reinitiate the lymphedema pumps. Again we are attempting to get this ulcer closed and doing better so that she can proceed with treatment at the lymphedema  clinic. 08/03/17 on evaluation today patient's wounds have completely closed including the small area on the left posterior aspect of her calf which we have been watching. She still has significant lymphedema unfortunately but the good news is there are no open ulcers. She is pleased  about this. We do need to get her an appointment with the lymphedema clinic. Patient History [name], [name] J. ([id number]) Information obtained from Patient. Family History Cancer - Child, Diabetes - Child,Maternal Grandparents, Heart Disease - Maternal Grandparents, Kidney Disease - Maternal Grandparents, No family history of Hypertension, Lung Disease, Seizures, Stroke, Thyroid Problems, Tuberculosis. Social History Former smoker - 20 + years ago, Marital Status - Married, Alcohol Use - Never, Drug Use - No History, Caffeine Use - Never. Medical History Hospitalization/Surgery History - 07/07/2005, [facility], Colon cancer. Review of Systems (ROS) Constitutional Symptoms (General Health) Denies complaints or symptoms of Fever, Chills. Respiratory The patient has no complaints or symptoms. Cardiovascular Complains or has symptoms of LE edema. Psychiatric The patient has no complaints or symptoms. Objective Constitutional Obese and well-hydrated in no acute distress. Vitals Time Taken: 9:44 AM, Height: 66 in, Temperature: 97.8 F, Pulse: 75 bpm, Respiratory Rate: 20 breaths/min, Blood Pressure: 129/58 mmHg. Respiratory normal breathing without difficulty. clear to auscultation bilaterally. Cardiovascular regular rate and rhythm with normal S1, S2. 1+ pitting edema of the bilateral lower extremities. Psychiatric this patient is able to make decisions and demonstrates good insight into disease process. Alert and Oriented x 3. pleasant and cooperative. General Notes: There was no wound opening at this point during my evaluation today. Patient is having no significant discomfort which is also good news. She does  continue to have significant bilateral lower extremity lymphedema. Integumentary (Hair, Skin) Wound #3 status is Healed - Epithelialized. Original cause of wound was Gradually Appeared. The wound is located on the Left,Proximal,Lateral Lower Leg. The wound measures 0cm length x 0cm width x 0cm depth; 0cm^2 area and 0cm^3 volume. There is no tunneling or undermining noted. There is a none present amount of drainage noted. There is large (67-100%) [name], [name] J. ([id number]) granulation within the wound bed. There is no necrotic tissue within the wound bed. Other Condition(s) Patient presents with Lymphedema located on the Left Leg. The skin appearance did not exhibit: Atrophie Blanche, Callus, Crepitus, Cyanosis, Dry/Scaly, Ecchymosis, Erythema, Excoriation, Friable, Hemosiderin Staining, Induration, Maceration, Mottled, Pallor, Rash, Rubor, Scarring. Skin temperature was noted as No Abnormality. Assessment Active Problems ICD-10 I89.0 - Lymphedema, not elsewhere classified L97.221 - Non-pressure chronic ulcer of left calf limited to breakdown of skin I80.203 - Phlebitis and thrombophlebitis of unspecified deep vessels of lower extremities, bilateral Plan Dressing Change Frequency: Change dressing every week Edema Control: 3 Layer Compression System - Left Lower Extremity - may use unna boot to only anchor dressing from sliding Compression Pump: Use compression pump on right lower extremity for 30 minutes, twice daily. Home Health: D/C Home Health Services - treatment completed Services and Therapies ordered were: Lymphedema Clinic At this time patient's wounds have completely closed and there are no open ulcerations. We are going to make her an appointment with the lymphedema clinic. For the time being I'm gonna recommend that we continue wrapping the legs for one-two more weeks in order to get enough time for her to get into the lymphedema clinic. Patient is in agreement with this plan.  I do not want these wounds to reopen after so recently closing. Please see above for specific wound care orders. We will see patient for re-evaluation in 1 week(s) here in the clinic. If anything worsens or changes patient will contact our office for additional recommendations. Electronic Signature(s) Signed: 08/03/2017 1:29:11 PM By: Lenda KelpStone III, Hoyt PA-C Entered By: Lenda KelpStone III, Hoyt on 08/03/2017 13:28:03 [name], [name] ([id number]) -------------------------------------------------------------------------------- ROS/PFSH Details  Patient Name: [name], TOMASETTI. Date of Service: 08/03/2017 8:45 AM Medical Record Number: [id number] Patient Account Number: [id number] Date of Birth/Sex: 07-May-1930 (84 y.o. Female) Treating RN: Renne Crigler Primary Care Provider: Etheleen Nicks Other Clinician: Referring Provider: Etheleen Nicks Treating Provider/Extender: Linwood Dibbles, HOYT Weeks in Treatment: 1 Information Obtained From Patient Wound History Do you currently have one or more open woundso Yes How many open wounds do you currently haveo 1 Approximately how long have you had your woundso 2 months Has your wound(s) ever healed and then re-openedo No Have you had any lab work done in the past montho No Have you tested positive for an antibiotic resistant organism (MRSA, VRE)o No Have you tested positive for osteomyelitis (bone infection)o No Have you had any tests for circulation on your legso No Constitutional Symptoms (General Health) Complaints and Symptoms: Negative for: Fever; Chills Cardiovascular Complaints and Symptoms: Positive for: LE edema Medical History: Positive for: Deep Vein Thrombosis - lower extremties bilaterally Negative for: Angina; Arrhythmia; Congestive Heart Failure; Coronary Artery Disease; Hypertension; Hypotension; Myocardial Infarction; Peripheral Arterial Disease; Peripheral Venous Disease; Phlebitis; Vasculitis Eyes Medical History: Negative for: Cataracts;  Glaucoma; Optic Neuritis Ear/Nose/Mouth/Throat Medical History: Negative for: Chronic sinus problems/congestion; Middle ear problems Hematologic/Lymphatic Medical History: Positive for: Lymphedema Negative for: Anemia; Hemophilia; Human Immunodeficiency Virus; Sickle Cell Disease Respiratory Complaints and Symptoms: No Complaints or Symptoms Medical History: Positive for: Asthma - controlled; Chronic Obstructive Pulmonary Disease (COPD) [name], [name] J. ([id number]) Negative for: Aspiration; Pneumothorax; Sleep Apnea; Tuberculosis Gastrointestinal Medical History: Negative for: Cirrhosis ; Colitis; Crohnos; Hepatitis A; Hepatitis B Endocrine Medical History: Negative for: Type I Diabetes; Type II Diabetes Genitourinary Medical History: Negative for: End Stage Renal Disease Immunological Medical History: Negative for: Lupus Erythematosus; Raynaudos; Scleroderma Integumentary (Skin) Medical History: Negative for: History of Burn; History of pressure wounds Musculoskeletal Medical History: Negative for: Gout; Rheumatoid Arthritis; Osteoarthritis; Osteomyelitis Neurologic Medical History: Negative for: Dementia; Neuropathy; Quadriplegia; Paraplegia; Seizure Disorder Oncologic Medical History: Positive for: Received Chemotherapy - 2007; Received Radiation - 2007 Psychiatric Complaints and Symptoms: No Complaints or Symptoms Medical History: Negative for: Anorexia/bulimia; Confinement Anxiety Immunizations Pneumococcal Vaccine: Received Pneumococcal Vaccination: Yes Implantable Devices Hospitalization / Surgery History Name of Hospital Purpose of Hospitalization/Surgery Date [facility] Colon cancer 07/07/2005 LURLEAN, KERNEN ([id number]) Family and Social History Cancer: Yes - Child; Diabetes: Yes - Child,Maternal Grandparents; Heart Disease: Yes - Maternal Grandparents; Hypertension: No; Kidney Disease: Yes - Maternal Grandparents; Lung Disease: No; Seizures: No; Stroke: No;  Thyroid Problems: No; Tuberculosis: No; Former smoker - 20 + years ago; Marital Status - Married; Alcohol Use: Never; Drug Use: No History; Caffeine Use: Never; Financial Concerns: No; Food, Clothing or Shelter Needs: No; Support System Lacking: No; Transportation Concerns: No; Advanced Directives: Yes (Not Provided); Patient does not want information on Advanced Directives; Living Will: Yes (Not Provided) Physician Affirmation I have reviewed and agree with the above information. Electronic Signature(s) Signed: 08/03/2017 1:29:11 PM By: Lenda Kelp PA-C Signed: 08/03/2017 5:30:49 PM By: Renne Crigler Entered By: Lenda Kelp on 08/03/2017 13:26:56 [name], [name] ([id number]) -------------------------------------------------------------------------------- SuperBill Details Patient Name: [place], [place] Gordon. Date of Service: 08/03/2017 Medical Record Number: [id number] Patient Account Number: [id number] Date of Birth/Sex: 04-28-1930 (84 y.o. Female) Treating RN: Renne Crigler Primary Care Provider: Etheleen Nicks Other Clinician: Referring Provider: Etheleen Nicks Treating Provider/Extender: Linwood Dibbles, HOYT Weeks in Treatment: 1 Diagnosis Coding ICD-10 Codes Code Description I89.0 Lymphedema, not elsewhere classified L97.221 Non-pressure chronic ulcer of left calf limited to breakdown of skin  I80.203 Phlebitis and thrombophlebitis of unspecified deep vessels of lower extremities, bilateral Facility Procedures CPT4 Code: [id number] Description: 99213 - WOUND CARE VISIT-LEV 3 EST PT Modifier: Quantity: 1 Physician Procedures CPT4 Code Description: [id number] 99213 - WC PHYS LEVEL 3 - EST PT ICD-10 Diagnosis Description I89.0 Lymphedema, not elsewhere classified L97.221 Non-pressure chronic ulcer of left calf limited to breakdown of I80.203 Phlebitis and thrombophlebitis of  unspecified deep vessels of l Modifier: skin ower extremities, Quantity: 1 bilateral Electronic  Signature(s) Signed: 08/03/2017 1:29:11 PM By: Lenda Kelp PA-C Previous Signature: 08/03/2017 12:34:10 PM Version By: Renne Crigler Entered By: Lenda Kelp on 08/03/2017 13:28:26

## 2017-08-10 ENCOUNTER — Encounter: Payer: Medicare Other | Attending: Physician Assistant | Admitting: Physician Assistant

## 2017-08-10 DIAGNOSIS — I890 Lymphedema, not elsewhere classified: Secondary | ICD-10-CM | POA: Diagnosis not present

## 2017-08-10 DIAGNOSIS — J449 Chronic obstructive pulmonary disease, unspecified: Secondary | ICD-10-CM | POA: Insufficient documentation

## 2017-08-10 DIAGNOSIS — Z7901 Long term (current) use of anticoagulants: Secondary | ICD-10-CM | POA: Diagnosis not present

## 2017-08-10 DIAGNOSIS — I878 Other specified disorders of veins: Secondary | ICD-10-CM | POA: Insufficient documentation

## 2017-08-10 DIAGNOSIS — Z8249 Family history of ischemic heart disease and other diseases of the circulatory system: Secondary | ICD-10-CM | POA: Insufficient documentation

## 2017-08-10 DIAGNOSIS — L97229 Non-pressure chronic ulcer of left calf with unspecified severity: Secondary | ICD-10-CM | POA: Insufficient documentation

## 2017-08-10 DIAGNOSIS — Z87891 Personal history of nicotine dependence: Secondary | ICD-10-CM | POA: Insufficient documentation

## 2017-08-10 DIAGNOSIS — Z85038 Personal history of other malignant neoplasm of large intestine: Secondary | ICD-10-CM | POA: Insufficient documentation

## 2017-08-10 DIAGNOSIS — Z86718 Personal history of other venous thrombosis and embolism: Secondary | ICD-10-CM | POA: Diagnosis not present

## 2017-08-11 NOTE — Progress Notes (Signed)
[place], [place] ([id number]) Visit Report for 08/10/2017 Chief Complaint Document Details Patient Name: [name], [name]. Date of Service: 08/10/2017 1:30 PM Medical Record Number: [id number] Patient Account Number: [id number] Date of Birth/Sex: Aug 22, 1929 (84 y.o. Female) Treating RN: Ashok Cordia, Debi Primary Care Provider: Etheleen Nicks Other Clinician: Referring Provider: Etheleen Nicks Treating Provider/Extender: Linwood Dibbles, Amin Fornwalt Weeks in Treatment: 2 Information Obtained from: Patient Chief Complaint Left lateral calf ulcer Electronic Signature(s) Signed: 08/10/2017 4:49:26 PM By: Lenda Kelp PA-C Entered By: Lenda Kelp on 08/10/2017 14:44:39 Fosberg, Tymia J. ([id number]) -------------------------------------------------------------------------------- HPI Details Patient Name: [name]. Date of Service: 08/10/2017 1:30 PM Medical Record Number: [id number] Patient Account Number: [id number] Date of Birth/Sex: 21-Feb-1930 (84 y.o. Female) Treating RN: Ashok Cordia, Debi Primary Care Provider: Etheleen Nicks Other Clinician: Referring Provider: Etheleen Nicks Treating Provider/Extender: Linwood Dibbles, Emery Binz Weeks in Treatment: 2 History of Present Illness Modifying Factors: Other treatment(s) tried include:she has been wearing what sounds like lymphedema pumps for the last 12 years or so HPI Description: 84 year old patient who comes with bilateral lower extremity edema which she's had for at least 15 years and now an ulcerated area on the left lower extremity which she's had for a month. He was recently seen by the PCP who made a diagnosis of venous stasis ulcer of the left calf and referred her to the wound center. He was placed on clindamycin. Past medical history significant for colon cancer, history of DVT, left tibial fracture in 2012 which required surgery, hyperlipidemia and has been on warfarin for several years. She also gives a history of having lymph pumps and she has been  wearing them for about 12 years. She is a former smoker and has quit about 10 years ago. 06/23/2016 -- she has not yet got her venous duplex study done but she did bring her lymphedema pumps and we have checked him out and they seem to be functioning fine 07/03/2016 -- a venous duplex study has not been scheduled for another month and I believe she has an appointment on January 28 07/31/2016 -- the patient continues to have significant lymphedema and she does not have appropriate compression stockings with her at home Readmission: 07/21/17 on evaluation today patient presents for reevaluation concerning a small ulceration she has of the left lateral calf region. This has been present for a couple of weeks prior to initial evaluation here in the clinic. She has not been on any recent antibiotics although she has been treated for a deep vein thrombosis which is almost completely resolved she is actually going to be seeing next month a vascular specialist at [facility]. Up to this point she has been managed mainly by her primary care provider according to patient. She does not have any significant discomfort which is good news. Unfortunately she is having some mild pain though note purulent discharge in the lateral calf wound. No fevers, chills, nausea, or vomiting noted at this time. 07/27/17 on evaluation today patient appears to be doing excellent in regard to her left lower extremity ulcer. This is not terribly smaller but it is doing much better there is less leaking from the wound and her leg seems to be much smaller after using the compression over the last week. She has not been using her lymphedema pumps due to the fact that that did bother her last week it was hurting and she even attempted to do this. The good news is that doesn't seem to be as bad at this point and I  think we may be able to reinitiate the lymphedema pumps. Again we are attempting to get this ulcer closed and doing better so that  she can proceed with treatment at the lymphedema clinic. 08/03/17 on evaluation today patient's wounds have completely closed including the small area on the left posterior aspect of her calf which we have been watching. She still has significant lymphedema unfortunately but the good news is there are no open ulcers. She is pleased about this. We do need to get her an appointment with the lymphedema clinic. 08/10/17 on evaluation today patient appears to be doing excellent. She has no new ulcerations. She has been tolerating the compression wraps since last week's evaluation which is great news. She does have an appointment the lymphedema clinic tomorrow. Electronic Signature(s) Signed: 08/10/2017 4:49:26 PM By: Lenda Kelp PA-C Entered By: Lenda Kelp on 08/10/2017 14:45:36 Cdebaca, Caralynn Shela Commons ([id number]) -------------------------------------------------------------------------------- Physical Exam Details Patient Name: [name]. Date of Service: 08/10/2017 1:30 PM Medical Record Number: [id number] Patient Account Number: [id number] Date of Birth/Sex: 10/04/1929 (84 y.o. Female) Treating RN: Ashok Cordia, Debi Primary Care Provider: Etheleen Nicks Other Clinician: Referring Provider: Etheleen Nicks Treating Provider/Extender: Linwood Dibbles, Derik Fults Weeks in Treatment: 2 Constitutional Well-nourished and well-hydrated in no acute distress. Respiratory normal breathing without difficulty. clear to auscultation bilaterally. Cardiovascular regular rate and rhythm with normal S1, S2. Bilateral non-pitting lymphedema. Psychiatric this patient is able to make decisions and demonstrates good insight into disease process. Alert and Oriented x 3. pleasant and cooperative. Notes Patient has no open ulcerations at this point. Electronic Signature(s) Signed: 08/10/2017 4:49:26 PM By: Lenda Kelp PA-C Entered By: Lenda Kelp on 08/10/2017 14:47:12 Glazier, Lexis Shela Commons  ([id number]) -------------------------------------------------------------------------------- Physician Orders Details Patient Name: Proctor, New Gordon. Date of Service: 08/10/2017 1:30 PM Medical Record Number: [id number] Patient Account Number: [id number] Date of Birth/Sex: 1930/06/29 (84 y.o. Female) Treating RN: Ashok Cordia, Debi Primary Care Provider: Etheleen Nicks Other Clinician: Referring Provider: Etheleen Nicks Treating Provider/Extender: Linwood Dibbles, Noah Pelaez Weeks in Treatment: 2 Verbal / Phone Orders: Yes Clinician: Pinkerton, Debi Read Back and Verified: Yes Diagnosis Coding Wound Cleansing o Cleanse wound with mild soap and water Edema Control o 3 Layer Compression System - Left Lower Extremity - unna to anchor o Elevate legs to the level of the heart and pump ankles as often as possible o Other: - use lymphedema pumps as prescribed Discharge From [facility] o Discharge from Wound Care Center - Keep wrap dry and do not remove until you go to the lymphedema clinic. Please call our office if you have any questions or concerns. Electronic Signature(s) Signed: 08/10/2017 4:26:47 PM By: Alejandro Mulling Signed: 08/10/2017 4:49:26 PM By: Lenda Kelp PA-C Entered By: Alejandro Mulling on 08/10/2017 14:03:42 Angell, Bailey JMarland Kitchen ([id number]) -------------------------------------------------------------------------------- Problem List Details Patient Name: Poteet, New Gordon. Date of Service: 08/10/2017 1:30 PM Medical Record Number: [id number] Patient Account Number: [id number] Date of Birth/Sex: [birth year] (84 y.o. Female) Treating RN: Ashok Cordia, Debi Primary Care Provider: Etheleen Nicks Other Clinician: Referring Provider: Etheleen Nicks Treating Provider/Extender: Linwood Dibbles, Itzayana Pardy Weeks in Treatment: 2 Active Problems ICD-10 Encounter Code Description Active Date Diagnosis I89.0 Lymphedema, not elsewhere classified 07/21/2017 Yes L97.221 Non-pressure chronic ulcer of left calf  limited to breakdown of skin 07/21/2017 Yes I80.203 Phlebitis and thrombophlebitis of unspecified deep vessels of lower 07/21/2017 Yes extremities, bilateral Inactive Problems Resolved Problems Electronic Signature(s) Signed: 08/10/2017 4:49:26 PM By: Lenda Kelp PA-C Entered By: Lenda Kelp on 08/10/2017 14:44:32 Marquard,  Cambry J. ([id number]) -------------------------------------------------------------------------------- Progress Note Details Patient Name: RAFFAELA, LADLEY. Date of Service: 08/10/2017 1:30 PM Medical Record Number: [id number] Patient Account Number: [id number] Date of Birth/Sex: [birth year] (84 y.o. Female) Treating RN: Ashok Cordia, Debi Primary Care Provider: Etheleen Nicks Other Clinician: Referring Provider: Etheleen Nicks Treating Provider/Extender: Linwood Dibbles, Johnhenry Tippin Weeks in Treatment: 2 Subjective Chief Complaint Information obtained from Patient Left lateral calf ulcer History of Present Illness (HPI) The following HPI elements were documented for the patient's wound: Modifying Factors: Other treatment(s) tried include:she has been wearing what sounds like lymphedema pumps for the last 12 years or so 84 year old patient who comes with bilateral lower extremity edema which she's had for at least 15 years and now an ulcerated area on the left lower extremity which she's had for a month. He was recently seen by the PCP who made a diagnosis of venous stasis ulcer of the left calf and referred her to the wound center. He was placed on clindamycin. Past medical history significant for colon cancer, history of DVT, left tibial fracture in 2012 which required surgery, hyperlipidemia and has been on warfarin for several years. She also gives a history of having lymph pumps and she has been wearing them for about 12 years. She is a former smoker and has quit about 10 years ago. 06/23/2016 -- she has not yet got her venous duplex study done but she did bring her lymphedema  pumps and we have checked him out and they seem to be functioning fine 07/03/2016 -- a venous duplex study has not been scheduled for another month and I believe she has an appointment on January 28 07/31/2016 -- the patient continues to have significant lymphedema and she does not have appropriate compression stockings with her at home Readmission: 07/21/17 on evaluation today patient presents for reevaluation concerning a small ulceration she has of the left lateral calf region. This has been present for a couple of weeks prior to initial evaluation here in the clinic. She has not been on any recent antibiotics although she has been treated for a deep vein thrombosis which is almost completely resolved she is actually going to be seeing next month a vascular specialist at [facility]. Up to this point she has been managed mainly by her primary care provider according to patient. She does not have any significant discomfort which is good news. Unfortunately she is having some mild pain though note purulent discharge in the lateral calf wound. No fevers, chills, nausea, or vomiting noted at this time. 07/27/17 on evaluation today patient appears to be doing excellent in regard to her left lower extremity ulcer. This is not terribly smaller but it is doing much better there is less leaking from the wound and her leg seems to be much smaller after using the compression over the last week. She has not been using her lymphedema pumps due to the fact that that did bother her last week it was hurting and she even attempted to do this. The good news is that doesn't seem to be as bad at this point and I think we may be able to reinitiate the lymphedema pumps. Again we are attempting to get this ulcer closed and doing better so that she can proceed with treatment at the lymphedema clinic. 08/03/17 on evaluation today patient's wounds have completely closed including the small area on the left posterior aspect  of her calf which we have been watching. She still has significant lymphedema unfortunately but the good news is  there are no open ulcers. She is pleased about this. We do need to get her an appointment with the lymphedema clinic. 08/10/17 on evaluation today patient appears to be doing excellent. She has no new ulcerations. She has been tolerating the compression wraps since last week's evaluation which is great news. She does have an [place] ([id number]) tomorrow. Patient History Information obtained from Patient. Family History Cancer - Child, Diabetes - Child,Maternal Grandparents, Heart Disease - Maternal Grandparents, Kidney Disease - Maternal Grandparents, No family history of Hypertension, Lung Disease, Seizures, Stroke, Thyroid Problems, Tuberculosis. Social History Former smoker - 20 + years ago, Marital Status - Married, Alcohol Use - Never, Drug Use - No History, Caffeine Use - Never. Medical History Hospitalization/Surgery History - 07/07/2005, [facility], Colon cancer. Review of Systems (ROS) Constitutional Symptoms (General Health) Denies complaints or symptoms of Fever, Chills. Respiratory The patient has no complaints or symptoms. Cardiovascular Complains or has symptoms of LE edema. Psychiatric The patient has no complaints or symptoms. Objective Constitutional Well-nourished and well-hydrated in no acute distress. Vitals Time Taken: 1:49 PM, Height: 66 in, Temperature: 97.7 F, Pulse: 75 bpm, Respiratory Rate: 20 breaths/min, Blood Pressure: 146/57 mmHg. Respiratory normal breathing without difficulty. clear to auscultation bilaterally. Cardiovascular regular rate and rhythm with normal S1, S2. Bilateral non-pitting lymphedema. Psychiatric this patient is able to make decisions and demonstrates good insight into disease process. Alert and Oriented x 3. pleasant and cooperative. General Notes: Patient has no open ulcerations at  this point. [place], [place] Shela CommonsJ. ([id number]) Assessment Active Problems ICD-10 I89.0 - Lymphedema, not elsewhere classified L97.221 - Non-pressure chronic ulcer of left calf limited to breakdown of skin I80.203 - Phlebitis and thrombophlebitis of unspecified deep vessels of lower extremities, bilateral Plan Wound Cleansing: Cleanse wound with mild soap and water Edema Control: 3 Layer Compression System - Left Lower Extremity - unna to anchor Elevate legs to the level of the heart and pump ankles as often as possible Other: - use lymphedema pumps as prescribed [facility]charge from W[facility] Care Center - Keep wrap dry and do not remove until you go to the lymphedema clinic. Please call our office if you have any questions or concerns. At this point I'm going to discontinue wound care services and have patient follow-up with the lymphedema clinic which has been the plan up to this point. She is in agreement with this plan. We will wrap her today in order to get her to lymphedema clinic and they will take over at that point. Electronic Signature(s) Signed: 08/10/2017 4:49:26 PM By: Lenda KelpStone III, Jakyra Kenealy PA-C Entered By: Lenda KelpStone III, Keylie Beavers on 08/10/2017 14:47:51 Mceuen, Ashante Shela CommonsJ. ([id number]) -------------------------------------------------------------------------------- ROS/PFSH Details Patient Name: [place], Lidia [name] Date of Service: 08/10/2017 1:30 PM Medical Record Number: [id number] Patient Account Number: [id number] Date of Birth/Sex: July 16, 1929 (84 y.o. Female) Treating RN: Ashok CordiaPinkerton, Debi Primary Care Provider: Etheleen NicksMACDONALD, KERI Other Clinician: Referring Provider: Etheleen NicksMACDONALD, KERI Treating Provider/Extender: Linwood DibblesSTONE III, Shiv Shuey Weeks in Treatment: 2 Information Obtained From Patient Wound History Do you currently have one or more open woundso Yes How many open wounds do you currently haveo 1 Approximately how long have you had your woundso 2 months Has your wound(s) ever healed and  then re-openedo No Have you had any lab work done in the past montho No Have you tested positive for an antibiotic resistant organism (MRSA, VRE)o No Have you tested positive for osteomyelitis (bone infection)o No Have you had any tests for circulation on  your legso No Constitutional Symptoms (General Health) Complaints and Symptoms: Negative for: Fever; Chills Cardiovascular Complaints and Symptoms: Positive for: LE edema Medical History: Positive for: Deep Vein Thrombosis - lower extremties bilaterally Negative for: Angina; Arrhythmia; Congestive Heart Failure; Coronary Artery Disease; Hypertension; Hypotension; Myocardial Infarction; Peripheral Arterial Disease; Peripheral Venous Disease; Phlebitis; Vasculitis Eyes Medical History: Negative for: Cataracts; Glaucoma; Optic Neuritis Ear/Nose/Mouth/Throat Medical History: Negative for: Chronic sinus problems/congestion; Middle ear problems Hematologic/Lymphatic Medical History: Positive for: Lymphedema Negative for: Anemia; Hemophilia; Human Immunodeficiency Virus; Sickle Cell Disease Respiratory Complaints and Symptoms: No Complaints or Symptoms Medical History: Positive for: Asthma - controlled; Chronic Obstructive Pulmonary Disease (COPD) Jankovich, Joia J. ([id number]) Negative for: Aspiration; Pneumothorax; Sleep Apnea; Tuberculosis Gastrointestinal Medical History: Negative for: Cirrhosis ; Colitis; Crohnos; Hepatitis A; Hepatitis B Endocrine Medical History: Negative for: Type I Diabetes; Type II Diabetes Genitourinary Medical History: Negative for: End Stage Renal Disease Immunological Medical History: Negative for: Lupus Erythematosus; Raynaudos; Scleroderma Integumentary (Skin) Medical History: Negative for: History of Burn; History of pressure wounds Musculoskeletal Medical History: Negative for: Gout; Rheumatoid Arthritis; Osteoarthritis; Osteomyelitis Neurologic Medical History: Negative for: Dementia;  Neuropathy; Quadriplegia; Paraplegia; Seizure Disorder Oncologic Medical History: Positive for: Received Chemotherapy - 2007; Received Radiation - 2007 Psychiatric Complaints and Symptoms: No Complaints or Symptoms Medical History: Negative for: Anorexia/bulimia; Confinement Anxiety Immunizations Pneumococcal Vaccine: Received Pneumococcal Vaccination: Yes Implantable Devices Hospitalization / Surgery History Name of Hospital Purpose of Hospitalization/Surgery Date [facility] Colon cancer 07/07/2005 ALVEENA, TAIRA ([id number]) Family and Social History Cancer: Yes - Child; Diabetes: Yes - Child,Maternal Grandparents; Heart Disease: Yes - Maternal Grandparents; Hypertension: No; Kidney Disease: Yes - Maternal Grandparents; Lung Disease: No; Seizures: No; Stroke: No; Thyroid Problems: No; Tuberculosis: No; Former smoker - 20 + years ago; Marital Status - Married; Alcohol Use: Never; Drug Use: No History; Caffeine Use: Never; Financial Concerns: No; Food, Clothing or Shelter Needs: No; Support System Lacking: No; Transportation Concerns: No; Advanced Directives: Yes (Not Provided); Patient does not want information on Advanced Directives; Living Will: Yes (Not Provided) Physician Affirmation I have reviewed and agree with the above information. Electronic Signature(s) Signed: 08/10/2017 4:26:47 PM By: Alejandro Mulling Signed: 08/10/2017 4:49:26 PM By: Lenda Kelp PA-C Entered By: Lenda Kelp on 08/10/2017 14:46:17 Korzeniewski, Avril J. ([id number]) -------------------------------------------------------------------------------- SuperBill Details Patient Name: [place], New Gordon. Date of Service: 08/10/2017 Medical Record Number: [id number] Patient Account Number: [id number] Date of Birth/Sex: [birth year] (84 y.o. Female) Treating RN: Ashok Cordia, Debi Primary Care Provider: Etheleen Nicks Other Clinician: Referring Provider: Etheleen Nicks Treating Provider/Extender: Linwood Dibbles, Yaritzi Craun Weeks in  Treatment: 2 Diagnosis Coding ICD-10 Codes Code Description I89.0 Lymphedema, not elsewhere classified L97.221 Non-pressure chronic ulcer of left calf limited to breakdown of skin I80.203 Phlebitis and thrombophlebitis of unspecified deep vessels of lower extremities, bilateral Facility Procedures CPT4 Code: [id number] Description: (Facility Use Only) [phone] - APPLY MULTLAY COMPRS LWR LT LEG Modifier: Quantity: 1 Physician Procedures CPT4 Code Description: [id number] - WC PHYS LEVEL 2 - EST PT ICD-10 Diagnosis Description I89.0 Lymphedema, not elsewhere classified L97.221 Non-pressure chronic ulcer of left calf limited to breakdown of I80.203 Phlebitis and thrombophlebitis of  unspecified deep vessels of l Modifier: skin ower extremities, Quantity: 1 bilateral Electronic Signature(s) Signed: 08/10/2017 2:50:37 PM By: Alejandro Mulling Signed: 08/10/2017 4:49:26 PM By: Lenda Kelp PA-C Entered By: Alejandro Mulling on 08/10/2017 14:50:36

## 2017-08-12 ENCOUNTER — Encounter: Payer: Self-pay | Admitting: Occupational Therapy

## 2017-08-12 ENCOUNTER — Other Ambulatory Visit: Payer: Self-pay

## 2017-08-12 ENCOUNTER — Ambulatory Visit: Payer: Medicare Other | Attending: Physician Assistant | Admitting: Occupational Therapy

## 2017-08-12 DIAGNOSIS — I890 Lymphedema, not elsewhere classified: Secondary | ICD-10-CM | POA: Insufficient documentation

## 2017-08-13 NOTE — Progress Notes (Signed)
[name] ([id number]) Visit Report for 08/10/2017 Arrival Information Details Patient Name: [name]. Date of Service: 08/10/2017 1:30 PM Medical Record Number: [id number] Patient Account Number: [id number] Date of Birth/Sex: 12/29/1929 (84 y.o. Female) Treating RN: Ashok Cordia, Debi Primary Care Damire Remedios: Etheleen Nicks Other Clinician: Referring Tyneshia Stivers: Etheleen Nicks Treating Fernande Treiber/Extender: Linwood Dibbles, HOYT Weeks in Treatment: 2 Visit Information History Since Last Visit Added or deleted any medications: No Patient Arrived: Wheel Chair Any new allergies or adverse reactions: No Arrival Time: 13:47 Had a fall or experienced change in No Accompanied By: son activities of daily living that may affect Transfer Assistance: EasyPivot Patient risk of falls: Lift Signs or symptoms of abuse/neglect since last visito No Patient Identification Verified: Yes Hospitalized since last visit: No Secondary Verification Process Yes Has Dressing in Place as Prescribed: Yes Completed: Has Compression in Place as Prescribed: Yes Patient Requires Transmission-Based No Precautions: Pain Present Now: Yes Patient Has Alerts: No Electronic Signature(s) Signed: 08/10/2017 4:26:47 PM By: Alejandro Mulling Entered By: Alejandro Mulling on 08/10/2017 13:48:54 Leather, Maram Gordon. ([id number]) -------------------------------------------------------------------------------- Encounter Discharge Information Details Patient Name: [name] [place], [name]. Date of Service: 08/10/2017 1:30 PM Medical Record Number: [id number] Patient Account Number: [id number] Date of Birth/Sex: 01-Jan-1930 (84 y.o. Female) Treating RN: Ashok Cordia, Debi Primary Care Bethan Adamek: Etheleen Nicks Other Clinician: Referring Jacky Hartung: Etheleen Nicks Treating Khamiyah Grefe/Extender: Linwood Dibbles, HOYT Weeks in Treatment: 2 Encounter Discharge Information Items Discharge Pain Level: 0 Discharge Condition: Stable Ambulatory Status:  Wheelchair Discharge Destination: Home Transportation: Private Auto Accompanied By: son Schedule Follow-up Appointment: No Medication Reconciliation completed and No provided to Patient/Care Chalyn Amescua: Provided on Clinical Summary of Care: 08/10/2017 Form Type Recipient Paper Patient IS Electronic Signature(s) Signed: 08/12/2017 9:47:07 AM By: Gwenlyn Perking Entered By: Gwenlyn Perking on 08/10/2017 14:14:47 Wolters, Gavrielle Gordon. ([id number]) -------------------------------------------------------------------------------- Lower Extremity Assessment Details Patient Name: [name], [name]. Date of Service: 08/10/2017 1:30 PM Medical Record Number: [id number] Patient Account Number: [id number] Date of Birth/Sex: [birth year] (84 y.o. Female) Treating RN: Ashok Cordia, Debi Primary Care Lakyia Behe: Etheleen Nicks Other Clinician: Referring Tamon Parkerson: Etheleen Nicks Treating Raygen Dahm/Extender: Linwood Dibbles, HOYT Weeks in Treatment: 2 Edema Assessment Assessed: [Left: No] [Right: No] [Left: Edema] [Right: :] Calf Left: Right: Point of Measurement: 35 cm From Medial Instep 50 cm cm Ankle Left: Right: Point of Measurement: 12 cm From Medial Instep 26.9 cm cm Vascular Assessment Pulses: Dorsalis Pedis Palpable: [Left:Yes] Posterior Tibial Extremity colors, hair growth, and conditions: Extremity Color: [Left:Hyperpigmented] Temperature of Extremity: [Left:Warm] Capillary Refill: [Left:< 3 seconds] Toe Nail Assessment Left: Right: Thick: Yes Discolored: Yes Deformed: Yes Improper Length and Hygiene: No Electronic Signature(s) Signed: 08/10/2017 4:26:47 PM By: Alejandro Mulling Entered By: Alejandro Mulling on 08/10/2017 13:57:52 Maraj, Katrin Gordon. ([id number]) -------------------------------------------------------------------------------- Multi Wound Chart Details Patient Name: [name], [name]. Date of Service: 08/10/2017 1:30 PM Medical Record Number: [id number] Patient Account Number: [id number] Date of  Birth/Sex: 06-03-1930 (84 y.o. Female) Treating RN: Ashok Cordia, Debi Primary Care Anda Sobotta: Etheleen Nicks Other Clinician: Referring Ishaq Maffei: Etheleen Nicks Treating Teja Costen/Extender: Linwood Dibbles, HOYT Weeks in Treatment: 2 Vital Signs Height(in): 66 Pulse(bpm): 75 Weight(lbs): Blood Pressure(mmHg): 146/57 Body Mass Index(BMI): Temperature(F): 97.7 Respiratory Rate 20 (breaths/min): Wound Assessments Treatment Notes Electronic Signature(s) Signed: 08/10/2017 4:26:47 PM By: Alejandro Mulling Entered By: Alejandro Mulling on 08/10/2017 14:01:43 Royall, Reilly JMarland Kitchen ([id number]) -------------------------------------------------------------------------------- Multi-Disciplinary Care Plan Details Patient Name: [name] [place], [place] Gordon. Date of Service: 08/10/2017 1:30 PM Medical Record Number: [id number] Patient Account Number: [id number] Date of Birth/Sex: 28-Jun-1930 (84 y.o. Female) Treating RN: Ashok Cordia, Debi Primary  Care Askia Hazelip: Etheleen NicksMACDONALD, KERI Other Clinician: Referring Tennile Styles: Etheleen NicksMACDONALD, KERI Treating Kivon Aprea/Extender: Linwood DibblesSTONE III, HOYT Weeks in Treatment: 2 Active Inactive Electronic Signature(s) Signed: 08/10/2017 4:26:47 PM By: Alejandro MullingPinkerton, Gordon Entered By: Alejandro MullingPinkerton, Gordon on 08/10/2017 14:01:35 Caspers, Adelita Gordon. ([id number]) -------------------------------------------------------------------------------- Pain Assessment Details Patient Name: [name] SandwichSMITH, [place] Gordon. Date of Service: 08/10/2017 1:30 PM Medical Record Number: [id number] Patient Account Number: [id number] Date of Birth/Sex: 1930/06/13 (84 y.o. Female) Treating RN: Ashok CordiaPinkerton, Debi Primary Care Carnie Bruemmer: Etheleen NicksMACDONALD, KERI Other Clinician: Referring Ashdon Gillson: Etheleen NicksMACDONALD, KERI Treating Tayjon Halladay/Extender: Linwood DibblesSTONE III, HOYT Weeks in Treatment: 2 Active Problems Location of Pain Severity and Description of Pain Patient Has Paino Yes Site Locations Pain Location: Generalized Pain Rate the pain. Current Pain Level: 4 Character of  Pain Describe the Pain: Aching Pain Management and Medication Current Pain Management: Notes Topical or injectable lidocaine is offered to patient for acute pain when surgical debridement is performed. If needed, Patient is instructed to use over the counter pain medication for the following 24-48 hours after debridement. Wound care MDs do not prescribed pain medications. Patient has chronic pain or uncontrolled pain. Patient has been instructed to make an appointment with their Primary Care Physician for pain management. Electronic Signature(s) Signed: 08/10/2017 4:26:47 PM By: Alejandro MullingPinkerton, Gordon Entered By: Alejandro MullingPinkerton, Gordon on 08/10/2017 13:49:24 Hessel, Anamaria Shela CommonsJ. ([id number]) -------------------------------------------------------------------------------- Patient/Caregiver Education Details Patient Name: MunsonSMITH, [name] CollumIRMA Gordon. Date of Service: 08/10/2017 1:30 PM Medical Record Number: [id number] Patient Account Number: [id number] Date of Birth/Gender: 1930/06/13 (84 y.o. Female) Treating RN: Ashok CordiaPinkerton, Debi Primary Care Physician: Etheleen NicksMACDONALD, KERI Other Clinician: Referring Physician: Etheleen NicksMACDONALD, KERI Treating Physician/Extender: Skeet SimmerSTONE III, HOYT Weeks in Treatment: 2 Education Assessment Education Provided To: Patient Education Topics Provided Wound/Skin Impairment: Handouts: Other: Please call our office if you have any questions or concerns. Methods: Demonstration, Explain/Verbal Responses: State content correctly Electronic Signature(s) Signed: 08/10/2017 4:26:47 PM By: Alejandro MullingPinkerton, Gordon Entered By: Alejandro MullingPinkerton, Gordon on 08/10/2017 14:04:49 Lusher, Dalonda Gordon. ([id number]) -------------------------------------------------------------------------------- Vitals Details Patient Name: WestsideSMITH, [name] CollumIRMA Gordon. Date of Service: 08/10/2017 1:30 PM Medical Record Number: [id number] Patient Account Number: [id number] Date of Birth/Sex: 1930/06/13 (84 y.o. Female) Treating RN: Ashok CordiaPinkerton, Debi Primary Care Snow Peoples:  Etheleen NicksMACDONALD, KERI Other Clinician: Referring Yida Hyams: Etheleen NicksMACDONALD, KERI Treating Ailynn Gow/Extender: Linwood DibblesSTONE III, HOYT Weeks in Treatment: 2 Vital Signs Time Taken: 13:49 Temperature (F): 97.7 Height (in): 66 Pulse (bpm): 75 Respiratory Rate (breaths/min): 20 Blood Pressure (mmHg): 146/57 Reference Range: 80 - 120 mg / dl Electronic Signature(s) Signed: 08/10/2017 4:26:47 PM By: Alejandro MullingPinkerton, Gordon Entered By: Alejandro MullingPinkerton, Gordon on 08/10/2017 13:51:59

## 2017-08-17 ENCOUNTER — Ambulatory Visit: Payer: Medicare Other | Admitting: Occupational Therapy

## 2017-08-17 DIAGNOSIS — I890 Lymphedema, not elsewhere classified: Secondary | ICD-10-CM | POA: Diagnosis not present

## 2017-08-17 NOTE — Patient Instructions (Signed)

## 2017-08-17 NOTE — Therapy (Signed)
[name] MAIN [facility] SERVICES [address] [place], [place], [address] Phone: [phone]   Fax:  [phone]  Occupational Therapy Evaluation  Patient Details  Name: [name] MRN: [id number] Date of Birth: 1930/03/04 Referring Provider: Dalbert Mayotte, PA-C   Encounter Date: 08/12/2017    Past Medical History:  Diagnosis Date  . COPD (chronic obstructive pulmonary disease) (HCC)   . DVT (deep venous thrombosis) (HCC)   . Hypertension   . Pneumothorax     Past Surgical History:  Procedure Laterality Date  . ABDOMINAL HYSTERECTOMY    . colon cancer    . HEMIARTHROPLASTY SHOULDER FRACTURE Left   . TIBIA FRACTURE SURGERY Left     There were no vitals filed for this visit.  Subjective Assessment - 08/17/17 1158    Subjective   [name] is referred to Occupational Therapy for evaluation and treatment of BLE lymphedema by [name] Kief[facility].  Pt has recently completed treatment for L lateral calf  ulcer. Leg is wrapped in what appears to be [employer]. with knee length tubigriip as outer leayer.  Pt is 30 minutes late for a 60 minute appointment. She is accompanied by her son, [name]. She arrives transport wc w/ legs in dependent position. Pt is a poor historian. Medical hx records leg swelling onset  >15 years. Pt can identify no known precipitating event.  Pty has not previously undergone lymphedema treatment. She does not utilized compression garments.     Patient is accompained by:  Family member    Pertinent History  Colon cx; hx of DVT, s/p 2012 L tibia fx; hyperlipiemia;; former smoker; hx L lateral calf venous stasis ulcer    Limitations  difficulty walking, impaired functional mobility and transfers, chronic BLE leg swelling and pain; impaired basic ADLs- LB bathing , dressing, and fitting LB clothing and street shoes, difficulty lifting legs to elevate and comple transfers, impaired IADLs- requires  assistance for all productive tasks and home management activities. Decreased social participation- homebound without assistance from family    Patient Stated Goals  get swelling down and keep it from getting worse    Pain Onset  More than a month ago    Pain Onset  More than a month ago        [name] OT Assessment - 08/17/17 0001      Assessment   Medical Diagnosis  BLE lymphedema, stage II moderate, secondary to suspected CVI, obesity,.  Contributing factors include systemic fluid retention 2/2 decreased kidney function    Referring Provider  [name] Mayotte, PA-C    Onset Date/Surgical Date  -- chronic, >15 yrs    Prior Therapy  referring provider's note reconds "copmpression pumps". Asked son to  bring photo to clinic next visit as I am unclear what device  she is using. ; no compression stocking. wearing  what appears to be knee length LLE una boot to clinic. Unable to remove as Pt is late for eval session      Precautions   Precaution Comments  Falls risk      Home  Environment   Family/patient expects to be discharged to:  Private residence      IADL   Prior Level of Function Shopping  dependent    Prior Level of Function Light Housekeeping  max A    Prior Level of Function Meal Prep  Max A    Prior Level of Function  Community Mobility  Max A      Mobility   Mobility Status  History of falls;Needs assist    Mobility Status Comments  Mod A x 1 to SPT from transport wc to treatment bed      Vision - History   Baseline Vision  Wears glasses all the time      Activity Tolerance   Activity Tolerance  -- endurance does not limit opparticipation in OT eval    Sitting Balance  Supports self independently with both upper extremities      Cognition   Overall Cognitive Status  History of cognitive impairments - at baseline      Posture/Postural Control   Posture/Postural Control  No significant limitations      Sensation   Light Touch  Appears Intact      Coordination    Gross Motor Movements are Fluid and Coordinated  No      ROM / Strength   AROM / PROM / Strength  AROM;PROM      AROM   Overall AROM   Deficits    Overall AROM Comments  limited AROM at B hips, knees and ankles 2/2 decreased skin flexibility and girth      PROM   Overall PROM   Within functional limits for tasks performed        Mild-Moderate, Stage II, BLE lymphedema, L>R, secondary to unknown etiology   Skin Description Hyper-Keratosis Peau' de Orange Shiny Tight Fibrotic Fatty Doughy Indurated    x x x Moderately dense palpable fibrosis below knees to ankles. L>R       Hydration Dry Flaky Erythema Other   mildly x     Color Redness Present Pallor Blanching Hemosiderin Staining Other      Slight skinb darkening at distal legs    Odor Malodorous Yeast Present Absent      x   Temperature Warm Cool Normal     x   Pitting Edema  NON PITTING   1+ 2+ 3+ 4+ >4            Girth Symmetrical Asymmetrical Other Distribution    L>R    Stemmer Sign Positive Negative   Strong + , L>R    Lymphorrea History Of:  Present Absent     Absent , at present, but + hx    Wounds History Of Present Absent Venous Arterial Pressure Size   denies   x          Signs of Infection Redness Warmth Erythema Acute Swelling Drainage absent                x   Scars Adhesions Hypersensitivity        Sensation Light Touch Deep pressure Hypersensitivty   Present Impaired Present Impaired Present Impaired   WNL   WNL- tender  WNL   x  Nails WNL Fungus Present Other   x     Hair Growth Symmetrical Asymmetrical   x    Skin Creases Base pf toes  Base of Fingers       mild            OT Treatments/Exercises (OP) - 08/17/17 0001      Bed Mobility   Bed Mobility  -- MaxA supine to sit w/out side rails      Transfers   Transfers  Sit to Stand;Stand to Sit    Sit to Stand  2: Max assist    Sit to Stand Details (  indicate cue type and reason)  verbal    Stand to Sit  3: Mod  assist    Stand to Sit Details  uncontroled       ADLs   Overall ADLs  Pt Mod to Max A for all basic and instrumental ADLs    Grooming  X    UB Dressing  X    LB Dressing  X    Toileting  X    Bathing  X    Functional Mobility  X    Cooking  X    Home Maintenance  X    Financial Management  X    Medication Management  X    Driving  X    Leisure  X    ADL Comments  very supportive family    ADL Education Given  Yes      Manual Therapy   Manual Therapy  Edema management;Other (comment)    Edema Management  inadequate time to remove wraps and rewrap today                 OT Long Term Goals - 08/12/17 1625      OT LONG TERM GOAL #1   Title  Pt mod A w/ lymphedema precautions and prevention principals and strategies with caregiver assistance using printed reference to limit LE progression and infection risk.    Baseline  dependent    Time  2    Period  Weeks    Status  New    Target Date  -- 10th OT visit      OT LONG TERM GOAL #2   Title  Lymphedema (LE) management/ self-care: Pt able to apply thigh length, multi layered, gradient compression wraps with Maximum caregiver assistance  using correct  techniques within 2 weeks to achieve optimal limb volume reductions in preparation for fitting compression garments/ devices bilaterally.    Baseline  Dependent    Time  2    Period  Weeks    Status  New    Target Date  08/12/17 10th OT visit      OT LONG TERM GOAL #3   Title  Lymphedema (LE) management/ self-care:  Pt to achieve at least 10%  BLE limb volume reductions  during Intensive Phase CDT to limit LE progression, to reduce pain, to improve ADLs performance.    Baseline  dependent    Time  12    Period  Weeks    Status  New    Target Date  11/10/17      OT LONG TERM GOAL #4   Title  Lymphedema (LE) management/ self-care:  Pt to tolerate daily compression wraps, compression garments and/ or HOS devices in keeping w/ prescribed wear regime within 1 week of  issue date of each to progress and retain clinical and functional gains and to limit LE progression.    Baseline  dependent    Time  12    Period  Weeks    Status  New    Target Date  11/10/17      OT LONG TERM GOAL #5   Title  Lymphedema (LE) management/ self-care:  During Management Phase CDT Pt to sustain current limb volumes within 3%, and all other clinical gains achieved during OT treatment independently (to control limb swelling and associated pain, to  limit LE progression, to decrease infection risk and to limit further functional decline.    Baseline  dependent    Time  6  Period  Months    Status  New    Target Date  02/13/18              Patient will benefit from skilled therapeutic intervention in order to improve the following deficits and impairments:  Abnormal gait, Decreased skin integrity, Decreased knowledge of precautions, Decreased scar mobility, Impaired perceived functional ability, Decreased activity tolerance, Decreased knowledge of use of DME, Decreased strength, Impaired flexibility, Decreased balance, Decreased mobility, Difficulty walking, Obesity, Decreased cognition, Decreased range of motion, Increased edema, Pain  Visit Diagnosis: Lymphedema, not elsewhere classified - Plan: Ot plan of care cert/re-cert    Problem List Patient Active Problem List   Diagnosis Date Noted  . Acute respiratory failure (HCC) 12/07/2016  . Spontaneous pneumothorax 12/05/2016  . Acute respiratory failure with hypoxia (HCC) 08/07/2016  . Influenza 08/05/2016  . Dyspnea 08/05/2016  . Constipation 08/05/2016  . Back pain 08/05/2016  . Generalized weakness 08/05/2016  . Intractable back pain 08/01/2016    Loel Dubonnet, MS, OTR/L, [facility] 08/17/17 4:33 PM  [place] [facility] MAIN [facility] SERVICES [address] [place], [place], [address] Phone: [phone]   Fax:  [phone]  Name: [name] MRN: [id number] Date of Birth:  [birth year]

## 2017-08-18 ENCOUNTER — Ambulatory Visit: Payer: Medicare Other | Admitting: Occupational Therapy

## 2017-08-18 DIAGNOSIS — I890 Lymphedema, not elsewhere classified: Secondary | ICD-10-CM | POA: Diagnosis not present

## 2017-08-18 NOTE — Therapy (Signed)
[facility] MAIN [facility] SERVICES [address] [place], [place], [address] Phone: [phone]   Fax:  [phone]  Occupational Therapy Treatment  Patient Details  Name: [name] MRN: [id number] Date of Birth: 1930/02/23 Referring Provider: Dalbert Mayotte, PA-C   Encounter Date: 08/18/2017  OT End of Session - 08/18/17 1623    Visit Number  3    Number of Visits  36    Date for OT Re-Evaluation  11/10/17    OT Start Time  0315    OT Stop Time  0415    OT Time Calculation (min)  60 min    Activity Tolerance  Patient tolerated treatment well;Patient limited by pain;Other (comment) BLE weakness    Behavior During Therapy  WFL for tasks assessed/performed       Past Medical History:  Diagnosis Date  . COPD (chronic obstructive pulmonary disease) (HCC)   . DVT (deep venous thrombosis) (HCC)   . Hypertension   . Pneumothorax     Past Surgical History:  Procedure Laterality Date  . ABDOMINAL HYSTERECTOMY    . colon cancer    . HEMIARTHROPLASTY SHOULDER FRACTURE Left   . TIBIA FRACTURE SURGERY Left     There were no vitals filed for this visit.  Subjective Assessment - 08/18/17 1617    Subjective   pT 15 MINUTES LATE FOR A 60 MINUTE ot SESSION. Pt presents for OT visit 3/ 36 to address BLE lymphedema. pT PRESENTS W/ knee length COMPREssion wraps in place. Pt reports no difficulty toklerating these and no SOB during visit interval.    Patient is accompained by:  Family member    Pertinent History  Colon cx; hx of DVT, s/p 2012 L tibia fx; hyperlipiemia;; former smoker; hx L lateral calf venous stasis ulcer    Limitations  difficulty walking, impaired functional mobility and transfers, chronic BLE leg swelling and pain; impaired basic ADLs- LB bathing , dressing, and fitting LB clothing and street shoes, difficulty lifting legs to elevate and comple transfers, impaired IADLs- requires assistance for all productive tasks and home management  activities. Decreased social participation- homebound without assistance from family    Patient Stated Goals  get swelling down and keep it from getting worse    Currently in Pain?  Yes unchanged from initial evaluiation. Not rated again today    Pain Location  Leg    Pain Orientation  Right;Left    Pain Onset  More than a month ago    Pain Onset  More than a month ago          LYMPHEDEMA/ONCOLOGY QUESTIONNAIRE - 08/18/17 1327      Lymphedema Assessments   Lymphedema Assessments  Lower extremities      Right Lower Extremity Lymphedema   Other  RLE ankle to politeal (A-D) limb volume measures 4482.26 ml. RLE knee to groin (E-G)      volume measures 8568.60 ml. RLE full leg (A-G ) volume measures 13050.86 m,l.      Left Lower Extremity Lymphedema   Other  LLE ankle to politeal (A-D) limb volume measures 4382.96 ml. LLE knee to groin (E-G)      volume measures 9553.41 ml. LLE full leg (A-G ) volume measures 13936.36    Other  Limb volume Baseline, BLE limb volumetrics reveal limb volume differential (LVD)  for legs measures 2.27%, R>L, which is essentially symetrical. LVD for thighs measures 10.31%, L>R; and LVD for full legs from ankle to groin  measures 6.55%, L>R. L thigh icongetion is visibly greater and palpably denser than R. Overall LVD i reveals close to WNL    differential, but this value does not speak to differences in distribution .               OT Treatments/Exercises (OP) - 08/18/17 0001      Transfers   Transfers  Sit to Stand;Stand to Sit      ADLs   ADL Education Given  Yes      Manual Therapy   Manual Therapy  Edema management;Other (comment);Compression Bandaging    Manual therapy comments  Transfer training for SPT to / from wc <_> Rx bed. Pt requires Max A x 1 to transfer to bed, then at end of session she requires Max A x 2  from bed to wc.    Compression Bandaging  Compression wraps from toes to groin today in effort to move fluid congestion beyond the  knee and towards functional groin LN. Wraps applied in circumferential gradient pattern. Added toe wraps today in single layer tyo address distal foot and toe swelling.             OT Education - 08/18/17 1622    Education Details  Pt and CG transfer training using rolling walker for SPT. Pt requires verbal and physical cues throughout transfers to sequence movements with Max A x 1 , or x 2 at end of session when fatigued. Pt and CG training for LE self care w/ emphasis on correct application of short stretch compression wraps.    Person(s) Educated  Patient;Child(ren)    Methods  Explanation;Demonstration;Tactile cues;Verbal cues;Handout    Comprehension  Verbalized understanding;Returned demonstration;Verbal cues required;Tactile cues required;Need further instruction          OT Long Term Goals - 08/12/17 1625      OT LONG TERM GOAL #1   Title  Pt mod A w/ lymphedema precautions and prevention principals and strategies with caregiver assistance using printed reference to limit LE progression and infection risk.    Baseline  dependent    Time  2    Period  Weeks    Status  New    Target Date  -- 10th OT visit      OT LONG TERM GOAL #2   Title  Lymphedema (LE) management/ self-care: Pt able to apply thigh length, multi layered, gradient compression wraps with Maximum caregiver assistance  using correct  techniques within 2 weeks to achieve optimal limb volume reductions in preparation for fitting compression garments/ devices bilaterally.    Baseline  Dependent    Time  2    Period  Weeks    Status  New    Target Date  08/12/17 10th OT visit      OT LONG TERM GOAL #3   Title  Lymphedema (LE) management/ self-care:  Pt to achieve at least 10%  BLE limb volume reductions  during Intensive Phase CDT to limit LE progression, to reduce pain, to improve ADLs performance.    Baseline  dependent    Time  12    Period  Weeks    Status  New    Target Date  11/10/17      OT LONG  TERM GOAL #4   Title  Lymphedema (LE) management/ self-care:  Pt to tolerate daily compression wraps, compression garments and/ or HOS devices in keeping w/ prescribed wear regime within 1 week of issue date of each to progress  and retain clinical and functional gains and to limit LE progression.    Baseline  dependent    Time  12    Period  Weeks    Status  New    Target Date  11/10/17      OT LONG TERM GOAL #5   Title  Lymphedema (LE) management/ self-care:  During Management Phase CDT Pt to sustain current limb volumes within 3%, and all other clinical gains achieved during OT treatment independently (to control limb swelling and associated pain, to  limit LE progression, to decrease infection risk and to limit further functional decline.    Baseline  dependent    Time  6    Period  Months    Status  New    Target Date  02/13/18            Plan - 08/18/17 1624    Clinical Impression Statement  Pt and CG transfer training using rolling walker for SPT. Pt requires verbal and physical cues throughout transfers to sequence movements with Max A x 1 , or x 2 at end of session when fatigued. Provided Pt and CG edu for application of short stretch wraps from toes to groin. No return today due to lime consttraints. Pt  able to teach back precautions for full leg wrap including removing them if she has acute pain and or atypical SOBH, especiay at rest. Cont as per POC.    Occupational Profile and client history currently impacting functional performance  Generalized weekness limits Pt's unctional mobility and ambulation./ She will benefit from Physical Therapy to address these deficits once LE is complete.    Occupational performance deficits (Please refer to evaluation for details):  ADL's;IADL's;Work;Leisure;Social Participation    Rehab Potential  Fair    Current Impairments/barriers affecting progress:  Pt prognosis without max caregiver assistance with LE self care home program between OT  visits is poor. Caregiver attendance, participation in therapy sessions, and daily assistance with LE self care going forward is essential for retaining clinical gains over time.    OT Frequency  3x / week    OT Treatment/Interventions  Self-care/ADL training;Manual lymph drainage;DME and/or AE instruction;Compression bandaging;Patient/family education;Therapeutic exercise;Manual Therapy;Therapeutic activities       Patient will benefit from skilled therapeutic intervention in order to improve the following deficits and impairments:  Abnormal gait, Decreased skin integrity, Decreased knowledge of precautions, Decreased scar mobility, Impaired perceived functional ability, Decreased activity tolerance, Decreased knowledge of use of DME, Decreased strength, Impaired flexibility, Decreased balance, Decreased mobility, Difficulty walking, Obesity, Decreased cognition, Decreased range of motion, Increased edema, Pain  Visit Diagnosis: Lymphedema, not elsewhere classified    Problem List Patient Active Problem List   Diagnosis Date Noted  . Acute respiratory failure (HCC) 12/07/2016  . Spontaneous pneumothorax 12/05/2016  . Acute respiratory failure with hypoxia (HCC) 08/07/2016  . Influenza 08/05/2016  . Dyspnea 08/05/2016  . Constipation 08/05/2016  . Back pain 08/05/2016  . Generalized weakness 08/05/2016  . Intractable back pain 08/01/2016    Loel Dubonnet, MS, OTR/L, [facility] 08/18/17 4:30 PM  [place] [facility] MAIN [facility] SERVICES [address] [place], [place], [address] Phone: [phone]   Fax:  [phone]  Name: [name] MRN: [id number] Date of Birth: 1929/11/12

## 2017-08-18 NOTE — Patient Instructions (Signed)

## 2017-08-18 NOTE — Therapy (Signed)
[facility] MAIN [facility] SERVICES [address] [place], [place], [address] Phone: [phone]   Fax:  [phone]  Occupational Therapy Treatment  Patient Details  Name: [name] MRN: [id number] Date of Birth: 01/16/1930 Referring Provider: Dalbert Mayotte, PA-C   Encounter Date: 08/17/2017  OT End of Session - 08/17/17 1005    Visit Number  2    Number of Visits  36    Date for OT Re-Evaluation  11/10/17    OT Start Time  0200    OT Stop Time  0315    OT Time Calculation (min)  75 min    Activity Tolerance  Patient tolerated treatment well;Patient limited by pain;Other (comment) BLE weakness    Behavior During Therapy  WFL for tasks assessed/performed       Past Medical History:  Diagnosis Date  . COPD (chronic obstructive pulmonary disease) (HCC)   . DVT (deep venous thrombosis) (HCC)   . Hypertension   . Pneumothorax     Past Surgical History:  Procedure Laterality Date  . ABDOMINAL HYSTERECTOMY    . colon cancer    . HEMIARTHROPLASTY SHOULDER FRACTURE Left   . TIBIA FRACTURE SURGERY Left     There were no vitals filed for this visit.  Subjective Assessment - 08/17/17 1158    Subjective   [name] is referred to Occupational Therapy for evaluation and treatment of BLE lymphedema by Jerald Kief[facility].  Pt has recently completed treatment for L lateral calf  ulcer. Leg is wrapped in what appears to be [employer]. with knee length tubigriip as outer leayer.  Pt is 30 minutes late for a 60 minute appointment. She is accompanied by her son, [name]. She arrives transport wc w/ legs in dependent position. Pt is a poor historian. Medical hx records leg swelling onset  >15 years. Pt can identify no known precipitating event.  Pty has not previously undergone lymphedema treatment. She does not utilized compression garments.     Patient is accompained by:  Family member    Pertinent History  Colon cx; hx  of DVT, s/p 2012 L tibia fx; hyperlipiemia;; former smoker; hx L lateral calf venous stasis ulcer    Limitations  difficulty walking, impaired functional mobility and transfers, chronic BLE leg swelling and pain; impaired basic ADLs- LB bathing , dressing, and fitting LB clothing and street shoes, difficulty lifting legs to elevate and comple transfers, impaired IADLs- requires assistance for all productive tasks and home management activities. Decreased social participation- homebound without assistance from family    Patient Stated Goals  get swelling down and keep it from getting worse    Pain Onset  More than a month ago    Pain Onset  More than a month ago          LYMPHEDEMA/ONCOLOGY QUESTIONNAIRE - 08/18/17 1327      Lymphedema Assessments   Lymphedema Assessments  Lower extremities      Right Lower Extremity Lymphedema   Other  RLE ankle to politeal (A-D) limb volume measures 4482.26 ml. RLE knee to groin (E-G)      volume measures 8568.60 ml. RLE full leg (A-G ) volume measures 13050.86 m,l.      Left Lower Extremity Lymphedema   Other  LLE ankle to politeal (A-D) limb volume measures 4382.96 ml. LLE knee to groin (E-G)      volume measures 9553.41 ml. LLE full leg (A-G )  volume measures 13936.36    Other  Limb volume Baseline, BLE limb volumetrics reveal limb volume differential (LVD)  for legs measures 2.27%, R>L, which is essentially symetrical. LVD for thighs measures 10.31%, L>R; and LVD for full legs from ankle to groin measures 6.55%, L>R. L thigh icongetion is visibly greater and palpably denser than R. Overall LVD i reveals close to WNL    differential, but this value does not speak to differences in distribution .                       OT Education - 08/18/17 1004    Education Details  Continued skilled Pt/caregiver education  And LE ADL training throughout visit for lymphedema self care/ home program, including compression wrapping, compression garment and  device wear/care, lymphatic pumping ther ex, simp- emphasis on volumetric measurement outcomes and compression wrappingle self-MLD, and skin care. Discussed progress towards goals.    Person(s) Educated  Patient;Child(ren)    Methods  Explanation;Demonstration    Comprehension  Verbalized understanding;Returned demonstration          OT Long Term Goals - 08/12/17 1625      OT LONG TERM GOAL #1   Title  Pt mod A w/ lymphedema precautions and prevention principals and strategies with caregiver assistance using printed reference to limit LE progression and infection risk.    Baseline  dependent    Time  2    Period  Weeks    Status  New    Target Date  -- 10th OT visit      OT LONG TERM GOAL #2   Title  Lymphedema (LE) management/ self-care: Pt able to apply thigh length, multi layered, gradient compression wraps with Maximum caregiver assistance  using correct  techniques within 2 weeks to achieve optimal limb volume reductions in preparation for fitting compression garments/ devices bilaterally.    Baseline  Dependent    Time  2    Period  Weeks    Status  New    Target Date  08/12/17 10th OT visit      OT LONG TERM GOAL #3   Title  Lymphedema (LE) management/ self-care:  Pt to achieve at least 10%  BLE limb volume reductions  during Intensive Phase CDT to limit LE progression, to reduce pain, to improve ADLs performance.    Baseline  dependent    Time  12    Period  Weeks    Status  New    Target Date  11/10/17      OT LONG TERM GOAL #4   Title  Lymphedema (LE) management/ self-care:  Pt to tolerate daily compression wraps, compression garments and/ or HOS devices in keeping w/ prescribed wear regime within 1 week of issue date of each to progress and retain clinical and functional gains and to limit LE progression.    Baseline  dependent    Time  12    Period  Weeks    Status  New    Target Date  11/10/17      OT LONG TERM GOAL #5   Title  Lymphedema (LE) management/  self-care:  During Management Phase CDT Pt to sustain current limb volumes within 3%, and all other clinical gains achieved during OT treatment independently (to control limb swelling and associated pain, to  limit LE progression, to decrease infection risk and to limit further functional decline.    Baseline  dependent    Time  6  Period  Months    Status  New    Target Date  02/13/18            Plan - 08/17/17 1448    Clinical Impression Statement  Completed baseline BLE comnparative limb volumetrics and educated Pt and caregiver about results and limb volume reduction goals. Initiated 3 layer, knee length, short stretch compression wrap to LLE after removing dressing applied at wound care clinic. Utilized a circumferential gradient pattern. Educated caregiver and patient re precautions and instructed Pt to remove wraps immediately if unusual SOB occures, especially at rest.  Remaineer of session dedicated to providintg Pt and CG edu for wrap application techniques. By end of session CG required Max A to apply. Cont as per POC.    Occupational Profile and client history currently impacting functional performance  Generalized weekness limits Pt's unctional mobility and ambulation./ She will benefit from Physical Therapy to address these deficits once LE is complete.    Occupational performance deficits (Please refer to evaluation for details):  ADL's;IADL's;Work;Leisure;Social Participation    Rehab Potential  Fair    Current Impairments/barriers affecting progress:  Pt prognosis without max caregiver assistance with LE self care home program between OT visits is poor. Caregiver attendance, participation in therapy sessions, and daily assistance with LE self care going forward is essential for retaining clinical gains over time.    OT Frequency  3x / week    OT Treatment/Interventions  Self-care/ADL training;Manual lymph drainage;DME and/or AE instruction;Compression bandaging;Patient/family  education;Therapeutic exercise;Manual Therapy;Therapeutic activities       Patient will benefit from skilled therapeutic intervention in order to improve the following deficits and impairments:  Abnormal gait, Decreased skin integrity, Decreased knowledge of precautions, Decreased scar mobility, Impaired perceived functional ability, Decreased activity tolerance, Decreased knowledge of use of DME, Decreased strength, Impaired flexibility, Decreased balance, Decreased mobility, Difficulty walking, Obesity, Decreased cognition, Decreased range of motion, Increased edema, Pain  Visit Diagnosis: Lymphedema, not elsewhere classified    Problem List Patient Active Problem List   Diagnosis Date Noted  . Acute respiratory failure (HCC) 12/07/2016  . Spontaneous pneumothorax 12/05/2016  . Acute respiratory failure with hypoxia (HCC) 08/07/2016  . Influenza 08/05/2016  . Dyspnea 08/05/2016  . Constipation 08/05/2016  . Back pain 08/05/2016  . Generalized weakness 08/05/2016  . Intractable back pain 08/01/2016    Loel Dubonnetheresa Linea Calles, MS, OTR/L, [facility] 08/18/17 2:53 PM  [facility] [address] [place], [place], [address] Phone: [phone]   Fax:  [phone]  Name: [name] Coombsrma J Okane MRN: [id number] Date of Birth: Feb 29, 1930

## 2017-08-24 ENCOUNTER — Ambulatory Visit: Payer: Medicare Other | Admitting: Occupational Therapy

## 2017-08-24 DIAGNOSIS — I890 Lymphedema, not elsewhere classified: Secondary | ICD-10-CM

## 2017-08-24 NOTE — Therapy (Signed)
[place] MAIN [facility] SERVICES [address] [place], [place], [address] Phone: [phone]   Fax:  [phone]  Occupational Therapy Treatment  Patient Details  Name: [name] MRN: [id number] Date of Birth: 01/15/1930 Referring Provider: Dalbert Mayotte, PA-C   Encounter Date: 08/24/2017  OT End of Session - 08/24/17 1652    Visit Number  4    Number of Visits  36    Date for OT Re-Evaluation  11/10/17    OT Start Time  0310    OT Stop Time  0410    OT Time Calculation (min)  60 min    Activity Tolerance  Patient tolerated treatment well;Patient limited by pain;Other (comment) BLE weakness and poor functional mobility    Behavior During Therapy  WFL for tasks assessed/performed       Past Medical History:  Diagnosis Date  . COPD (chronic obstructive pulmonary disease) (HCC)   . DVT (deep venous thrombosis) (HCC)   . Hypertension   . Pneumothorax     Past Surgical History:  Procedure Laterality Date  . ABDOMINAL HYSTERECTOMY    . colon cancer    . HEMIARTHROPLASTY SHOULDER FRACTURE Left   . TIBIA FRACTURE SURGERY Left     There were no vitals filed for this visit.  Subjective Assessment - 08/24/17 1651    Subjective   Pt 10 minutes late for 60 minute OT session. Pt presents w/ compression wraps  in place applied by private caregiver. Son has been teaching caregivers between sessions to apply wraps using gradient techniques according to plan.    Patient is accompained by:  Family member    Pertinent History  Colon cx; hx of DVT, s/p 2012 L tibia fx; hyperlipiemia;; former smoker; hx L lateral calf venous stasis ulcer    Limitations  difficulty walking, impaired functional mobility and transfers, chronic BLE leg swelling and pain; impaired basic ADLs- LB bathing , dressing, and fitting LB clothing and street shoes, difficulty lifting legs to elevate and comple transfers, impaired IADLs- requires assistance for all productive tasks  and home management activities. Decreased social participation- homebound without assistance from family    Patient Stated Goals  get swelling down and keep it from getting worse    Currently in Pain?  Yes BLE leg pain unchanged.from initial OT evaluation.  Not rated nuumerically    Pain Onset  More than a month ago    Pain Onset  More than a month ago                           OT Education - 08/24/17 1655    Education Details  Cont transfer training and caregiver compressionw rapping throughout session.     Person(s) Educated  Patient;Child(ren)    Methods  Explanation;Demonstration;Tactile cues;Verbal cues;Handout    Comprehension  Verbalized understanding;Returned demonstration;Verbal cues required;Tactile cues required;Need further instruction          OT Long Term Goals - 08/12/17 1625      OT LONG TERM GOAL #1   Title  Pt mod A w/ lymphedema precautions and prevention principals and strategies with caregiver assistance using printed reference to limit LE progression and infection risk.    Baseline  dependent    Time  2    Period  Weeks    Status  New    Target Date  -- 10th OT visit      OT LONG TERM  GOAL #2   Title  Lymphedema (LE) management/ self-care: Pt able to apply thigh length, multi layered, gradient compression wraps with Maximum caregiver assistance  using correct  techniques within 2 weeks to achieve optimal limb volume reductions in preparation for fitting compression garments/ devices bilaterally.    Baseline  Dependent    Time  2    Period  Weeks    Status  New    Target Date  08/12/17 10th OT visit      OT LONG TERM GOAL #3   Title  Lymphedema (LE) management/ self-care:  Pt to achieve at least 10%  BLE limb volume reductions  during Intensive Phase CDT to limit LE progression, to reduce pain, to improve ADLs performance.    Baseline  dependent    Time  12    Period  Weeks    Status  New    Target Date  11/10/17      OT LONG TERM  GOAL #4   Title  Lymphedema (LE) management/ self-care:  Pt to tolerate daily compression wraps, compression garments and/ or HOS devices in keeping w/ prescribed wear regime within 1 week of issue date of each to progress and retain clinical and functional gains and to limit LE progression.    Baseline  dependent    Time  12    Period  Weeks    Status  New    Target Date  11/10/17      OT LONG TERM GOAL #5   Title  Lymphedema (LE) management/ self-care:  During Management Phase CDT Pt to sustain current limb volumes within 3%, and all other clinical gains achieved during OT treatment independently (to control limb swelling and associated pain, to  limit LE progression, to decrease infection risk and to limit further functional decline.    Baseline  dependent    Time  6    Period  Months    Status  New    Target Date  02/13/18            Plan - 08/24/17 1656    Clinical Impression Statement  Carever applied compression wraps incorrectly, starting at top and wrapping towards foot, causing leg to swell more distally than at last visit. Majority of visit today devoted to caregiver training for compressison wrapping. Son was able to apply wraps correctly by end of session. He recorded a cell phone video for caregiver reference  between visits and he plans to review technique again with them. Pt unmotivated to participate in transfer training in clinic. Going forward son will assist Pt with all transfers in clinic and OT will focus on LE care. Pt requires max A x 1 for SPT to wc and  for all bed mobility. She is unable to lift legs during therapy . Cont as per POC.    Occupational Profile and client history currently impacting functional performance  Generalized weekness limits Pt's unctional mobility and ambulation./ She will benefit from Physical Therapy to address these deficits once LE is complete.    Occupational performance deficits (Please refer to evaluation for details):   ADL's;IADL's;Work;Leisure;Social Participation    Rehab Potential  Fair    Current Impairments/barriers affecting progress:  Pt prognosis without max caregiver assistance with LE self care home program between OT visits is poor. Caregiver attendance, participation in therapy sessions, and daily assistance with LE self care going forward is essential for retaining clinical gains over time.    OT Frequency  3x / week  OT Treatment/Interventions  Self-care/ADL training;Manual lymph drainage;DME and/or AE instruction;Compression bandaging;Patient/family education;Therapeutic exercise;Manual Therapy;Therapeutic activities       Patient will benefit from skilled therapeutic intervention in order to improve the following deficits and impairments:  Abnormal gait, Decreased skin integrity, Decreased knowledge of precautions, Decreased scar mobility, Impaired perceived functional ability, Decreased activity tolerance, Decreased knowledge of use of DME, Decreased strength, Impaired flexibility, Decreased balance, Decreased mobility, Difficulty walking, Obesity, Decreased cognition, Decreased range of motion, Increased edema, Pain  Visit Diagnosis: Lymphedema, not elsewhere classified    Problem List Patient Active Problem List   Diagnosis Date Noted  . Acute respiratory failure (HCC) 12/07/2016  . Spontaneous pneumothorax 12/05/2016  . Acute respiratory failure with hypoxia (HCC) 08/07/2016  . Influenza 08/05/2016  . Dyspnea 08/05/2016  . Constipation 08/05/2016  . Back pain 08/05/2016  . Generalized weakness 08/05/2016  . Intractable back pain 08/01/2016    Loel Dubonnet, MS, OTR/L, [facility] 08/24/17 5:02 PM  [facility] MAIN [facility] SERVICES [address] [place], [place], [address] Phone: [phone]   Fax:  [phone]  Name: [name] MRN: [id number] Date of Birth: [birth year]

## 2017-08-25 ENCOUNTER — Ambulatory Visit: Payer: Medicare Other | Admitting: Occupational Therapy

## 2017-08-25 DIAGNOSIS — I890 Lymphedema, not elsewhere classified: Secondary | ICD-10-CM | POA: Diagnosis not present

## 2017-08-26 NOTE — Therapy (Signed)
[place] [facility] MAIN [facility] SERVICES [address] [place], [place], [address] Phone: [phone]   Fax:  [phone]  Occupational Therapy Treatment  Patient Details  Name: [name] MRN: [id number] Date of Birth: 19-Oct-1929 Referring Provider: Dalbert MayotteHoyt Eday Stone, PA-C   Encounter Date: 08/25/2017  OT End of Session - 08/25/17 1156    Visit Number  5    Number of Visits  36    Date for OT Re-Evaluation  11/10/17    OT Start Time  0300    OT Stop Time  0413    OT Time Calculation (min)  73 min    Activity Tolerance  Patient tolerated treatment well;Patient limited by pain;Other (comment) BLE weakness and poor functional mobility    Behavior During Therapy  WFL for tasks assessed/performed       Past Medical History:  Diagnosis Date  . COPD (chronic obstructive pulmonary disease) (HCC)   . DVT (deep venous thrombosis) (HCC)   . Hypertension   . Pneumothorax     Past Surgical History:  Procedure Laterality Date  . ABDOMINAL HYSTERECTOMY    . colon cancer    . HEMIARTHROPLASTY SHOULDER FRACTURE Left   . TIBIA FRACTURE SURGERY Left     There were no vitals filed for this visit.  Subjective Assessment - 08/25/17 1150    Subjective   Pt on time for 60 minute OT session. Pt presents w/ compression wraps  in place applied by private caregiver. Son accompanies Pt today. Pt has no new complaints. Son reports he reviewed compression wrapping techniques w/ caregivers and gave each one a copy of instructional video made in last session.    Patient is accompained by:  Family member    Pertinent History  Colon cx; hx of DVT, s/p 2012 L tibia fx; hyperlipiemia;; former smoker; hx L lateral calf venous stasis ulcer    Limitations  difficulty walking, impaired functional mobility and transfers, chronic BLE leg swelling and pain; impaired basic ADLs- LB bathing , dressing, and fitting LB clothing and street shoes, difficulty lifting legs to elevate and  comple transfers, impaired IADLs- requires assistance for all productive tasks and home management activities. Decreased social participation- homebound without assistance from family    Patient Stated Goals  get swelling down and keep it from getting worse    Currently in Pain?  Yes BLE leg pain unchanged since initial eval.    Pain Onset  More than a month ago    Pain Onset  More than a month ago                   OT Treatments/Exercises (OP) - 08/26/17 0001      ADLs   ADL Education Given  Yes      Manual Therapy   Manual Therapy  Edema management;Compression Bandaging;Manual Lymphatic Drainage (MLD)    Manual therapy comments  DC transfer training. Son assists w/ all transfers in clinic today and going forward.    Manual Lymphatic Drainage (MLD)  MLD to LLE utilizing "short neck: sequence, deep abdominal lymphatics and functional inguinal LN.    Compression Bandaging  Compression wraps from toes to proximal thigh as established. Unable to extend wraps to groin due to Pt's inability to left leg while seated on plinth, or to stand when wrapping.              OT Education - 08/25/17 1154    Education Details  Intro level Pt  and CG edu for simple self MLD    Person(s) Educated  Patient;Child(ren)    Methods  Explanation;Demonstration;Tactile cues;Verbal cues;Handout    Comprehension  Verbalized understanding;Returned demonstration;Verbal cues required;Tactile cues required;Need further instruction          OT Long Term Goals - 08/12/17 1625      OT LONG TERM GOAL #1   Title  Pt mod A w/ lymphedema precautions and prevention principals and strategies with caregiver assistance using printed reference to limit LE progression and infection risk.    Baseline  dependent    Time  2    Period  Weeks    Status  New    Target Date  -- 10th OT visit      OT LONG TERM GOAL #2   Title  Lymphedema (LE) management/ self-care: Pt able to apply thigh length, multi layered,  gradient compression wraps with Maximum caregiver assistance  using correct  techniques within 2 weeks to achieve optimal limb volume reductions in preparation for fitting compression garments/ devices bilaterally.    Baseline  Dependent    Time  2    Period  Weeks    Status  New    Target Date  08/12/17 10th OT visit      OT LONG TERM GOAL #3   Title  Lymphedema (LE) management/ self-care:  Pt to achieve at least 10%  BLE limb volume reductions  during Intensive Phase CDT to limit LE progression, to reduce pain, to improve ADLs performance.    Baseline  dependent    Time  12    Period  Weeks    Status  New    Target Date  11/10/17      OT LONG TERM GOAL #4   Title  Lymphedema (LE) management/ self-care:  Pt to tolerate daily compression wraps, compression garments and/ or HOS devices in keeping w/ prescribed wear regime within 1 week of issue date of each to progress and retain clinical and functional gains and to limit LE progression.    Baseline  dependent    Time  12    Period  Weeks    Status  New    Target Date  11/10/17      OT LONG TERM GOAL #5   Title  Lymphedema (LE) management/ self-care:  During Management Phase CDT Pt to sustain current limb volumes within 3%, and all other clinical gains achieved during OT treatment independently (to control limb swelling and associated pain, to  limit LE progression, to decrease infection risk and to limit further functional decline.    Baseline  dependent    Time  6    Period  Months    Status  New    Target Date  02/13/18            Plan - 08/26/17 1157    Clinical Impression Statement  Pt tolerated MLD to LLE despite hypersensativity to very light touch at medial thigh. Pt reports analgesic effect below the knee. Son has mastered compression wraps using correct techniques., and he is working with caregivers. Pt tolerated session with less frustration and increased feelings of security using larger mat table. Cont as per POC.  Pt demonstrates visible LLE volume reduction.    Occupational Profile and client history currently impacting functional performance  Generalized weekness limits Pt's unctional mobility and ambulation./ She will benefit from Physical Therapy to address these deficits once LE is complete.    Occupational performance deficits (Please refer  to evaluation for details):  ADL's;IADL's;Work;Leisure;Social Participation    Rehab Potential  Fair    Current Impairments/barriers affecting progress:  Pt prognosis without max caregiver assistance with LE self care home program between OT visits is poor. Caregiver attendance, participation in therapy sessions, and daily assistance with LE self care going forward is essential for retaining clinical gains over time.    OT Frequency  3x / week    OT Treatment/Interventions  Self-care/ADL training;Manual lymph drainage;DME and/or AE instruction;Compression bandaging;Patient/family education;Therapeutic exercise;Manual Therapy;Therapeutic activities       Patient will benefit from skilled therapeutic intervention in order to improve the following deficits and impairments:  Abnormal gait, Decreased skin integrity, Decreased knowledge of precautions, Decreased scar mobility, Impaired perceived functional ability, Decreased activity tolerance, Decreased knowledge of use of DME, Decreased strength, Impaired flexibility, Decreased balance, Decreased mobility, Difficulty walking, Obesity, Decreased cognition, Decreased range of motion, Increased edema, Pain  Visit Diagnosis: Lymphedema, not elsewhere classified    Problem List Patient Active Problem List   Diagnosis Date Noted  . Acute respiratory failure (HCC) 12/07/2016  . Spontaneous pneumothorax 12/05/2016  . Acute respiratory failure with hypoxia (HCC) 08/07/2016  . Influenza 08/05/2016  . Dyspnea 08/05/2016  . Constipation 08/05/2016  . Back pain 08/05/2016  . Generalized weakness 08/05/2016  . Intractable  back pain 08/01/2016    Loel Dubonnet, MS, OTR/L, [facility] 08/26/17 12:01 PM  [place] [facility] MAIN [facility] SERVICES [address] [place], [place], [address] Phone: [phone]   Fax:  [phone]  Name: [name] MRN: [id number] Date of Birth: Mar 01, 1930

## 2017-08-27 ENCOUNTER — Ambulatory Visit: Payer: Medicare Other | Admitting: Occupational Therapy

## 2017-08-27 DIAGNOSIS — I890 Lymphedema, not elsewhere classified: Secondary | ICD-10-CM

## 2017-08-27 NOTE — Therapy (Signed)
[place] [facility] MAIN [facility] SERVICES [address] [place], [place], [address] Phone: [phone]   Fax:  [phone]  Occupational Therapy Treatment  Patient Details  Name: [name] MRN: [id number] Date of Birth: Jan 15, 1930 Referring Provider: Dalbert Mayotte, PA-C   Encounter Date: 08/27/2017  OT End of Session - 08/27/17 1640    Visit Number  6    Number of Visits  36    Date for OT Re-Evaluation  11/10/17    OT Start Time  0204    OT Stop Time  0310    OT Time Calculation (min)  66 min    Activity Tolerance  Patient tolerated treatment well;Patient limited by pain;Other (comment) BLE weakness and poor functional mobility    Behavior During Therapy  WFL for tasks assessed/performed       Past Medical History:  Diagnosis Date  . COPD (chronic obstructive pulmonary disease) (HCC)   . DVT (deep venous thrombosis) (HCC)   . Hypertension   . Pneumothorax     Past Surgical History:  Procedure Laterality Date  . ABDOMINAL HYSTERECTOMY    . colon cancer    . HEMIARTHROPLASTY SHOULDER FRACTURE Left   . TIBIA FRACTURE SURGERY Left     There were no vitals filed for this visit.  Subjective Assessment - 08/27/17 1637    Subjective    Pt presents on time for OT treatment 7/36 to address BLE lymphedema. Pt presents w/ compression wraps  in place applied by private caregiver. Son accompanies Pt today. Pt has no new complaints.     Patient is accompained by:  Family member    Pertinent History  Colon cx; hx of DVT, s/p 2012 L tibia fx; hyperlipiemia;; former smoker; hx L lateral calf venous stasis ulcer    Limitations  difficulty walking, impaired functional mobility and transfers, chronic BLE leg swelling and pain; impaired basic ADLs- LB bathing , dressing, and fitting LB clothing and street shoes, difficulty lifting legs to elevate and comple transfers, impaired IADLs- requires assistance for all productive tasks and home management activities.  Decreased social participation- homebound without assistance from family    Patient Stated Goals  get swelling down and keep it from getting worse    Currently in Pain?  Yes chronic pain unchanged and unrated today    Pain Onset  More than a month ago    Pain Onset  More than a month ago                   OT Treatments/Exercises (OP) - 08/27/17 0001      ADLs   ADL Education Given  Yes      Manual Therapy   Manual Therapy  Edema management;Compression Bandaging;Manual Lymphatic Drainage (MLD)    Manual therapy comments  skin care to LLE w/ low Ph castor oil (palma christi) during MLD. Pt reports CG are providing daily skin care at home.     Manual Lymphatic Drainage (MLD)  MLD to LLE utilizing "short neck: sequence, deep abdominal lymphatics and functional inguinal LN.    Compression Bandaging  Compression wraps from toes to proximal thigh as established. Unable to extend wraps to groin due to Pt's inability to left leg while seated on plinth, or to stand when wrapping.              OT Education - 08/27/17 1640    Education provided  Yes    Education Details  Continued skilled Pt/caregiver  education  And LE ADL training throughout visit for lymphedema self care/ home program, including compression wrapping, compression garment and device wear/care, lymphatic pumping ther ex, simple self-MLD, and skin care. Discussed progress towards goals.    Person(s) Educated  Patient;Child(ren)    Methods  Explanation;Demonstration    Comprehension  Verbalized understanding;Returned demonstration          OT Long Term Goals - 08/12/17 1625      OT LONG TERM GOAL #1   Title  Pt mod A w/ lymphedema precautions and prevention principals and strategies with caregiver assistance using printed reference to limit LE progression and infection risk.    Baseline  dependent    Time  2    Period  Weeks    Status  New    Target Date  -- 10th OT visit      OT LONG TERM GOAL #2    Title  Lymphedema (LE) management/ self-care: Pt able to apply thigh length, multi layered, gradient compression wraps with Maximum caregiver assistance  using correct  techniques within 2 weeks to achieve optimal limb volume reductions in preparation for fitting compression garments/ devices bilaterally.    Baseline  Dependent    Time  2    Period  Weeks    Status  New    Target Date  08/12/17 10th OT visit      OT LONG TERM GOAL #3   Title  Lymphedema (LE) management/ self-care:  Pt to achieve at least 10%  BLE limb volume reductions  during Intensive Phase CDT to limit LE progression, to reduce pain, to improve ADLs performance.    Baseline  dependent    Time  12    Period  Weeks    Status  New    Target Date  11/10/17      OT LONG TERM GOAL #4   Title  Lymphedema (LE) management/ self-care:  Pt to tolerate daily compression wraps, compression garments and/ or HOS devices in keeping w/ prescribed wear regime within 1 week of issue date of each to progress and retain clinical and functional gains and to limit LE progression.    Baseline  dependent    Time  12    Period  Weeks    Status  New    Target Date  11/10/17      OT LONG TERM GOAL #5   Title  Lymphedema (LE) management/ self-care:  During Management Phase CDT Pt to sustain current limb volumes within 3%, and all other clinical gains achieved during OT treatment independently (to control limb swelling and associated pain, to  limit LE progression, to decrease infection risk and to limit further functional decline.    Baseline  dependent    Time  6    Period  Months    Status  New    Target Date  02/13/18            Plan - 08/27/17 1641    Clinical Impression Statement  Compression wraps applied by 1 of 2 paid caregivers at home are well done and in keeping with techniques taught to son. Pt reports they are comfortable and have been well tolerated. Encouraged Pt, in addition to daily skin care, to allow CG to bathe feet  carefully and perform nail care ASAP to decrease infwection risk due to skin debris between toes and under nails. Pt demonstrates improved  LLE knee fexion today from  0 last several visits, to ~20degress  in clinic today while applying wraps. Knee swelling is significanty decreased allowing increased AROM. Pt also demonstrating decreased hypersensativity at medial thigh and L knee during MLD. Cont as per POC.    Occupational Profile and client history currently impacting functional performance  Generalized weekness limits Pt's unctional mobility and ambulation./ She will benefit from Physical Therapy to address these deficits once LE is complete.    Occupational performance deficits (Please refer to evaluation for details):  ADL's;IADL's;Work;Leisure;Social Participation    Rehab Potential  Fair    Current Impairments/barriers affecting progress:  Pt prognosis without max caregiver assistance with LE self care home program between OT visits is poor. Caregiver attendance, participation in therapy sessions, and daily assistance with LE self care going forward is essential for retaining clinical gains over time.    OT Frequency  3x / week    OT Treatment/Interventions  Self-care/ADL training;Manual lymph drainage;DME and/or AE instruction;Compression bandaging;Patient/family education;Therapeutic exercise;Manual Therapy;Therapeutic activities       Patient will benefit from skilled therapeutic intervention in order to improve the following deficits and impairments:  Abnormal gait, Decreased skin integrity, Decreased knowledge of precautions, Decreased scar mobility, Impaired perceived functional ability, Decreased activity tolerance, Decreased knowledge of use of DME, Decreased strength, Impaired flexibility, Decreased balance, Decreased mobility, Difficulty walking, Obesity, Decreased cognition, Decreased range of motion, Increased edema, Pain  Visit Diagnosis: Lymphedema, not elsewhere  classified    Problem List Patient Active Problem List   Diagnosis Date Noted  . Acute respiratory failure (HCC) 12/07/2016  . Spontaneous pneumothorax 12/05/2016  . Acute respiratory failure with hypoxia (HCC) 08/07/2016  . Influenza 08/05/2016  . Dyspnea 08/05/2016  . Constipation 08/05/2016  . Back pain 08/05/2016  . Generalized weakness 08/05/2016  . Intractable back pain 08/01/2016    Loel Dubonnetheresa Andry Bogden, MS, OTR/L, [facility] 08/27/17 4:46 PM  [place] [facility] [address] [place], [place], [address] Phone: [phone]   Fax:  [phone]  Name: [name] MRN: [id number] Date of Birth: [birth year]

## 2017-08-31 ENCOUNTER — Ambulatory Visit: Payer: Medicare Other | Admitting: Occupational Therapy

## 2017-08-31 DIAGNOSIS — I890 Lymphedema, not elsewhere classified: Secondary | ICD-10-CM

## 2017-08-31 NOTE — Therapy (Signed)
[place] [facility] [address] [place], [place], [address] Phone: [phone]   Fax:  [phone]  Occupational Therapy Treatment  Patient Details  Name: [name] Coombsrma J Sando MRN: [id number] Date of Birth: 14-Jun-1930 Referring Provider: Dalbert MayotteHoyt Eday Stone, PA-C   Encounter Date: 08/31/2017  OT End of Session - 08/31/17 1611    Visit Number  7    Number of Visits  36    Date for OT Re-Evaluation  11/10/17    OT Start Time  0210    OT Stop Time  0315    OT Time Calculation (min)  65 min    Activity Tolerance  Patient tolerated treatment well;Patient limited by pain;Other (comment) BLE weakness and poor functional mobility    Behavior During Therapy  WFL for tasks assessed/performed       Past Medical History:  Diagnosis Date  . COPD (chronic obstructive pulmonary disease) (HCC)   . DVT (deep venous thrombosis) (HCC)   . Hypertension   . Pneumothorax     Past Surgical History:  Procedure Laterality Date  . ABDOMINAL HYSTERECTOMY    . colon cancer    . HEMIARTHROPLASTY SHOULDER FRACTURE Left   . TIBIA FRACTURE SURGERY Left     There were no vitals filed for this visit.  Subjective Assessment - 08/31/17 1613    Subjective    Pt presents on time for OT treatment 7/36 to address BLE lymphedema. Pt presents w/ compression wraps  in place applied by private caregiver. Son accompanies Pt today. Pt has no new complaints.     Patient is accompained by:  Family member    Pertinent History  Colon cx; hx of DVT, s/p 2012 L tibia fx; hyperlipiemia;; former smoker; hx L lateral calf venous stasis ulcer    Limitations  difficulty walking, impaired functional mobility and transfers, chronic BLE leg swelling and pain; impaired basic ADLs- LB bathing , dressing, and fitting LB clothing and street shoes, difficulty lifting legs to elevate and comple transfers, impaired IADLs- requires assistance for all productive tasks and home management activities.  Decreased social participation- homebound without assistance from family    Patient Stated Goals  get swelling down and keep it from getting worse    Currently in Pain?  Yes chronic back pain unchanged since niitial eval.    Pain Onset  More than a month ago    Pain Onset  More than a month ago                   OT Treatments/Exercises (OP) - 08/31/17 0001      ADLs   ADL Education Given  Yes      Exercises   Exercises  -- BLE lymphatic pumping ther ex in supine: Pt co set of 10reps      Manual Therapy   Manual Therapy  Edema management;Manual Lymphatic Drainage (MLD);Compression Bandaging    Manual therapy comments  skin care to LLE w/ low Ph castor oil (palma christi) during MLD. Pt reports CG are providing daily skin care at home.     Manual Lymphatic Drainage (MLD)  MLD to LLE utilizing "short neck: sequence, deep abdominal lymphatics and functional inguinal LN.    Compression Bandaging  Compression wraps from toes to proximal thigh as established. Unable to extend wraps to groin due to Pt's inability to left leg while seated on plinth, or to stand when wrapping.  OT Education - 08/31/17 1616    Education provided  Yes    Education Details  Continued skilled Pt/caregiver education  And LE ADL training throughout visit for lymphedema self care/ home program, including compression wrapping, compression garment and device wear/care, lymphatic pumping ther ex, simple self-MLD, and skin care. Discussed progress towards goals.    Person(s) Educated  Patient    Methods  Explanation;Demonstration    Comprehension  Verbalized understanding;Returned demonstration          OT Long Term Goals - 08/12/17 1625      OT LONG TERM GOAL #1   Title  Pt mod A w/ lymphedema precautions and prevention principals and strategies with caregiver assistance using printed reference to limit LE progression and infection risk.    Baseline  dependent    Time  2    Period   Weeks    Status  New    Target Date  -- 10th OT visit      OT LONG TERM GOAL #2   Title  Lymphedema (LE) management/ self-care: Pt able to apply thigh length, multi layered, gradient compression wraps with Maximum caregiver assistance  using correct  techniques within 2 weeks to achieve optimal limb volume reductions in preparation for fitting compression garments/ devices bilaterally.    Baseline  Dependent    Time  2    Period  Weeks    Status  New    Target Date  08/12/17 10th OT visit      OT LONG TERM GOAL #3   Title  Lymphedema (LE) management/ self-care:  Pt to achieve at least 10%  BLE limb volume reductions  during Intensive Phase CDT to limit LE progression, to reduce pain, to improve ADLs performance.    Baseline  dependent    Time  12    Period  Weeks    Status  New    Target Date  11/10/17      OT LONG TERM GOAL #4   Title  Lymphedema (LE) management/ self-care:  Pt to tolerate daily compression wraps, compression garments and/ or HOS devices in keeping w/ prescribed wear regime within 1 week of issue date of each to progress and retain clinical and functional gains and to limit LE progression.    Baseline  dependent    Time  12    Period  Weeks    Status  New    Target Date  11/10/17      OT LONG TERM GOAL #5   Title  Lymphedema (LE) management/ self-care:  During Management Phase CDT Pt to sustain current limb volumes within 3%, and all other clinical gains achieved during OT treatment independently (to control limb swelling and associated pain, to  limit LE progression, to decrease infection risk and to limit further functional decline.    Baseline  dependent    Time  6    Period  Months    Status  New    Target Date  02/13/18            Plan - 08/31/17 1617    Clinical Impression Statement  aPt able to complete BLE therapeutic exercise for lymphatic pumping with Max a in clinic today. Pt instructed to perform bilaterally in supported position, supine or  seated, 2 sets of 10 reps for each exercise in sequence, 2 times daily. Provided printed resourcce. Skin hydration mobility continue to improve. Scaley skin beloe the L knee appears abourt to slough off . Limb  volume is considerably decreased , and care staff is doing a very nice job of assisting Pt w/ all LE self care between visits. LLE hypersensativity is significantly decreased during MLD.  Pt ability to lift and move legs for bed mobility is much improved well. Cont as per POC.    Occupational Profile and client history currently impacting functional performance  Generalized weekness limits Pt's unctional mobility and ambulation./ She will benefit from Physical Therapy to address these deficits once LE is complete.    Occupational performance deficits (Please refer to evaluation for details):  ADL's;IADL's;Work;Leisure;Social Participation    Rehab Potential  Fair    Current Impairments/barriers affecting progress:  Pt prognosis without max caregiver assistance with LE self care home program between OT visits is poor. Caregiver attendance, participation in therapy sessions, and daily assistance with LE self care going forward is essential for retaining clinical gains over time.    OT Frequency  3x / week    OT Treatment/Interventions  Self-care/ADL training;Manual lymph drainage;DME and/or AE instruction;Compression bandaging;Patient/family education;Therapeutic exercise;Manual Therapy;Therapeutic activities       Patient will benefit from skilled therapeutic intervention in order to improve the following deficits and impairments:  Abnormal gait, Decreased skin integrity, Decreased knowledge of precautions, Decreased scar mobility, Impaired perceived functional ability, Decreased activity tolerance, Decreased knowledge of use of DME, Decreased strength, Impaired flexibility, Decreased balance, Decreased mobility, Difficulty walking, Obesity, Decreased cognition, Decreased range of motion, Increased  edema, Pain  Visit Diagnosis: Lymphedema, not elsewhere classified    Problem List Patient Active Problem List   Diagnosis Date Noted  . Acute respiratory failure (HCC) 12/07/2016  . Spontaneous pneumothorax 12/05/2016  . Acute respiratory failure with hypoxia (HCC) 08/07/2016  . Influenza 08/05/2016  . Dyspnea 08/05/2016  . Constipation 08/05/2016  . Back pain 08/05/2016  . Generalized weakness 08/05/2016  . Intractable back pain 08/01/2016    Loel Dubonnet, MS, OTR/L, [facility] 08/31/17 4:23 PM  [place] [facility] MAIN [facility] SERVICES [address] [place], [place], [address] Phone: [phone]   Fax:  [phone]  Name: JARETZI DROZ MRN: [id number] Date of Birth: [birth year]

## 2017-09-01 ENCOUNTER — Ambulatory Visit: Payer: Medicare Other | Admitting: Occupational Therapy

## 2017-09-03 ENCOUNTER — Ambulatory Visit: Payer: Medicare Other | Admitting: Occupational Therapy

## 2017-09-07 ENCOUNTER — Ambulatory Visit: Payer: Medicare Other | Attending: Physician Assistant | Admitting: Occupational Therapy

## 2017-09-07 DIAGNOSIS — I890 Lymphedema, not elsewhere classified: Secondary | ICD-10-CM | POA: Insufficient documentation

## 2017-09-08 ENCOUNTER — Ambulatory Visit: Payer: Medicare Other | Admitting: Occupational Therapy

## 2017-09-10 ENCOUNTER — Ambulatory Visit: Payer: Medicare Other | Admitting: Occupational Therapy

## 2017-09-10 ENCOUNTER — Ambulatory Visit (INDEPENDENT_AMBULATORY_CARE_PROVIDER_SITE_OTHER): Payer: Medicare Other | Admitting: Podiatry

## 2017-09-10 ENCOUNTER — Encounter: Payer: Self-pay | Admitting: Podiatry

## 2017-09-10 DIAGNOSIS — I890 Lymphedema, not elsewhere classified: Secondary | ICD-10-CM

## 2017-09-10 DIAGNOSIS — B351 Tinea unguium: Secondary | ICD-10-CM | POA: Diagnosis not present

## 2017-09-10 DIAGNOSIS — L600 Ingrowing nail: Secondary | ICD-10-CM

## 2017-09-10 DIAGNOSIS — M79676 Pain in unspecified toe(s): Secondary | ICD-10-CM

## 2017-09-10 DIAGNOSIS — D689 Coagulation defect, unspecified: Secondary | ICD-10-CM

## 2017-09-10 NOTE — Progress Notes (Addendum)
Complaint:  Visit Type: Patient returns to my office for continued preventative foot care services. Complaint: patient states that her right big toenail is extremely painful wearing her shoes.  She says the nails are growing into both corners of right big toe.  Patient has an Radio broadcast assistantUnna boot on her left foot and leg.  Patient has not been seen for over 8 months.  She presents the office today for an evaluation and treatment of the painful big toenail, right.  She is accompanied to this visit by her brother.  Podiatric Exam: Vascular: dorsalis pedis and posterior tibial pulses are palpable right.. Capillary return is immediate. Temperature gradient is WNL. Skin turgor WNL  Sensorium: Normal Semmes Weinstein monofilament test. Normal tactile sensation  Nail Exam: Marked incurvation noted medial border great toe right.  Pincer hallux toenail noted. Ulcer Exam: There is no evidence of ulcer or pre-ulcerative changes or infection. Orthopedic Exam: Muscle tone and strength are WNL. No limitations in general ROM. No crepitus or effusions noted. Foot type and digits show no abnormalities. Bony prominences are unremarkable. Skin: No Porokeratosis. No infection or ulcers  Diagnosis:  Ingrown toenail right foot.  Onychomycosis right hallux.  Treatment & Plan Procedures and Treatment: Consent by patient was obtained for treatment procedures. The patient understood the discussion of treatment and procedures well. All questions were answered thoroughly reviewed. Debridement of mycotic and hypertrophic toenails, 1 through 5 bilateral and clearing of subungual debris. No ulceration, no infection noted. ABN signed for 2019. Return Visit-Office Procedure: Patient instructed to return to the office for a follow up visit 3 months  for continued preventative foot care services.      Helane GuntherGregory Stina Gane DPM

## 2017-09-10 NOTE — Therapy (Signed)
[facility] [address] , [place], [address] Phone: [phone]   Fax:  [phone]  Occupational Therapy Treatment  Patient Details  Name: [name] MRN: [id number] Date of Birth: December 12, 1929 Referring Provider: Dalbert MayotteHoyt Eday Stone, PA-C   Encounter Date: 09/10/2017  OT End of Session - 09/10/17 1523    Visit Number  8    Number of Visits  36    Date for OT Re-Evaluation  11/10/17    OT Start Time  0219    OT Stop Time  0314    OT Time Calculation (min)  55 min    Activity Tolerance  Patient tolerated treatment well;Patient limited by pain;Other (comment) BLE weakness and poor functional mobility    Behavior During Therapy  WFL for tasks assessed/performed       Past Medical History:  Diagnosis Date  . COPD (chronic obstructive pulmonary disease) (HCC)   . DVT (deep venous thrombosis) (HCC)   . Hypertension   . Pneumothorax     Past Surgical History:  Procedure Laterality Date  . ABDOMINAL HYSTERECTOMY    . colon cancer    . HEMIARTHROPLASTY SHOULDER FRACTURE Left   . TIBIA FRACTURE SURGERY Left     There were no vitals filed for this visit.  Subjective Assessment - 09/10/17 1524    Subjective   Pt 19 minutes late for 60 minute appointment. Pt is transported to OT today by her brother as her son, who usually brings her to OT, is sick . Brother reports Pt's spouse has just commenced hospice care at home. Pt reports she hasn't been feeling well either.    Patient is accompained by:  Family member    Pertinent History  Colon cx; hx of DVT, s/p 2012 L tibia fx; hyperlipiemia;; former smoker; hx L lateral calf venous stasis ulcer    Limitations  difficulty walking, impaired functional mobility and transfers, chronic BLE leg swelling and pain; impaired basic ADLs- LB bathing , dressing, and fitting LB clothing and street shoes, difficulty lifting legs to elevate and comple transfers, impaired IADLs- requires  assistance for all productive tasks and home management activities. Decreased social participation- homebound without assistance from family    Patient Stated Goals  get swelling down and keep it from getting worse    Currently in Pain?  Yes respiratory symptoms- frequent gravely cough. not rated numerically.    Pain Onset  More than a month ago    Pain Onset  More than a month ago                   OT Treatments/Exercises (OP) - 09/10/17 0001      ADLs   ADL Education Given  Yes      Manual Therapy   Manual Therapy  Edema management;Manual Lymphatic Drainage (MLD);Compression Bandaging    Manual therapy comments  skin care to LLE w/ low Ph castor oil (palma christi) during MLD. Pt reports CG are providing daily skin care at home.     Manual Lymphatic Drainage (MLD)  MLD to LLE utilizing "short neck: sequence, deep abdominal lymphatics and functional inguinal LN.    Compression Bandaging  Compression wraps from toes to proximal thigh as established. Unable to extend wraps to groin due to Pt's inability to left leg while seated on plinth, or to stand when wrapping.              OT Education - 09/10/17 1521  Education provided  Yes    Education Details  Pt educated on rehab attendance policy. Requested Pt call when she's unabl;e to attend therapy. Pt understands if she misses 3 sessions without calling her schedule is removed. This was Pt's 3rd visit after 2 missed ones without calls, she she was pleased to attend today. Pt edu for diaphragmatic breathing  as she tends to hold her breath during session.     Person(s) Educated  Patient;Other (comment)    Methods  Explanation;Demonstration    Comprehension  Verbalized understanding;Returned demonstration          OT Long Term Goals - 08/12/17 1625      OT LONG TERM GOAL #1   Title  Pt mod A w/ lymphedema precautions and prevention principals and strategies with caregiver assistance using printed reference to limit  LE progression and infection risk.    Baseline  dependent    Time  2    Period  Weeks    Status  New    Target Date  -- 10th OT visit      OT LONG TERM GOAL #2   Title  Lymphedema (LE) management/ self-care: Pt able to apply thigh length, multi layered, gradient compression wraps with Maximum caregiver assistance  using correct  techniques within 2 weeks to achieve optimal limb volume reductions in preparation for fitting compression garments/ devices bilaterally.    Baseline  Dependent    Time  2    Period  Weeks    Status  New    Target Date  08/12/17 10th OT visit      OT LONG TERM GOAL #3   Title  Lymphedema (LE) management/ self-care:  Pt to achieve at least 10%  BLE limb volume reductions  during Intensive Phase CDT to limit LE progression, to reduce pain, to improve ADLs performance.    Baseline  dependent    Time  12    Period  Weeks    Status  New    Target Date  11/10/17      OT LONG TERM GOAL #4   Title  Lymphedema (LE) management/ self-care:  Pt to tolerate daily compression wraps, compression garments and/ or HOS devices in keeping w/ prescribed wear regime within 1 week of issue date of each to progress and retain clinical and functional gains and to limit LE progression.    Baseline  dependent    Time  12    Period  Weeks    Status  New    Target Date  11/10/17      OT LONG TERM GOAL #5   Title  Lymphedema (LE) management/ self-care:  During Management Phase CDT Pt to sustain current limb volumes within 3%, and all other clinical gains achieved during OT treatment independently (to control limb swelling and associated pain, to  limit LE progression, to decrease infection risk and to limit further functional decline.    Baseline  dependent    Time  6    Period  Months    Status  New    Target Date  02/13/18            Plan - 09/10/17 1526    Clinical Impression Statement  Pt has lost some functional mobility since missing several OT sessions. (Last  attended 2/25). She requires Max A x 2 for transfers and all bed mobility today. LLE presenting with mild increase in swelling from foot to groin. Skin is better hydrated, but renains dry  and scaley. Pt reports caregivers have wrapped LLE daily during treatment interval. Pt verbalized understanding of Rehab attendance poli9cy after education. Pt understands that her prognosis is partially dependant on attendance according w/ POC. Also discussed possible need to decrease frequency of OT if burden becomes to great during this difficult time at home. Pt agrrees to call if she's unable to attend OT. Pt tolerated MLD without increased pain today. Available compression wrapps applied  from foot to above knee. CG will apply 5th wrap at home. Cont as per POC.    Occupational Profile and client history currently impacting functional performance  Generalized weekness limits Pt's unctional mobility and ambulation./ She will benefit from Physical Therapy to address these deficits once LE is complete.    Occupational performance deficits (Please refer to evaluation for details):  ADL's;IADL's;Work;Leisure;Social Participation    Rehab Potential  Fair    Current Impairments/barriers affecting progress:  Pt prognosis without max caregiver assistance with LE self care home program between OT visits is poor. Caregiver attendance, participation in therapy sessions, and daily assistance with LE self care going forward is essential for retaining clinical gains over time.    OT Frequency  3x / week    OT Treatment/Interventions  Self-care/ADL training;Manual lymph drainage;DME and/or AE instruction;Compression bandaging;Patient/family education;Therapeutic exercise;Manual Therapy;Therapeutic activities       Patient will benefit from skilled therapeutic intervention in order to improve the following deficits and impairments:  Abnormal gait, Decreased skin integrity, Decreased knowledge of precautions, Decreased scar mobility,  Impaired perceived functional ability, Decreased activity tolerance, Decreased knowledge of use of DME, Decreased strength, Impaired flexibility, Decreased balance, Decreased mobility, Difficulty walking, Obesity, Decreased cognition, Decreased range of motion, Increased edema, Pain  Visit Diagnosis: Lymphedema, not elsewhere classified    Problem List Patient Active Problem List   Diagnosis Date Noted  . Acute respiratory failure (HCC) 12/07/2016  . Spontaneous pneumothorax 12/05/2016  . Acute respiratory failure with hypoxia (HCC) 08/07/2016  . Influenza 08/05/2016  . Dyspnea 08/05/2016  . Constipation 08/05/2016  . Back pain 08/05/2016  . Generalized weakness 08/05/2016  . Intractable back pain 08/01/2016    Loel Dubonnet, MS, OTR/L, [facility] 09/10/17 3:32 PM  [facility] MAIN [facility] SERVICES [address] [place], [place], [address] Phone: [phone]   Fax:  [phone]  Name: GENEAL HUEBERT MRN: [id number] Date of Birth: Apr 21, 1930

## 2017-09-14 ENCOUNTER — Ambulatory Visit: Payer: Medicare Other | Admitting: Occupational Therapy

## 2017-09-15 ENCOUNTER — Encounter: Payer: Medicare Other | Admitting: Occupational Therapy

## 2017-09-17 ENCOUNTER — Encounter: Payer: Medicare Other | Admitting: Occupational Therapy

## 2017-09-21 ENCOUNTER — Ambulatory Visit: Payer: Medicare Other | Admitting: Occupational Therapy

## 2017-09-21 DIAGNOSIS — I890 Lymphedema, not elsewhere classified: Secondary | ICD-10-CM | POA: Diagnosis not present

## 2017-09-21 NOTE — Therapy (Signed)
[place] [facility] MAIN [facility] SERVICES [address] [place], [place], [address] Phone: [phone]   Fax:  [phone]  Occupational Therapy Treatment  Patient Details  Name: [name] MRN: [id number] Date of Birth: February 27, 1930 Referring Provider: Dalbert Mayotte, PA-C   Encounter Date: 09/21/2017  OT End of Session - 09/21/17 1622    Visit Number  9    Number of Visits  36    Date for OT Re-Evaluation  11/10/17    OT Start Time  0215    OT Stop Time  0303    OT Time Calculation (min)  48 min    Activity Tolerance  Patient tolerated treatment well;Patient limited by pain;Other (comment) BLE weakness and poor functional mobility    Behavior During Therapy  WFL for tasks assessed/performed       Past Medical History:  Diagnosis Date  . COPD (chronic obstructive pulmonary disease) (HCC)   . DVT (deep venous thrombosis) (HCC)   . Hypertension   . Pneumothorax     Past Surgical History:  Procedure Laterality Date  . ABDOMINAL HYSTERECTOMY    . colon cancer    . HEMIARTHROPLASTY SHOULDER FRACTURE Left   . TIBIA FRACTURE SURGERY Left     There were no vitals filed for this visit.  Subjective Assessment - 09/21/17 1621    Subjective   Pt presents for OT visit 9/36 to address BLE lymphedema (LE) Pt is transported by private caregiver today instead of her son. Pt's spouse passed away several days ago , so consequently she has not attended OT since 3/7. Pt remains in agreement with 3 x weekly OT POC. She verbalized understanding that we can reduce frequeny or postpone Rx for LE PRN. She states she prefers to continue.    Patient is accompained by:  Family member    Pertinent History  Colon cx; hx of DVT, s/p 2012 L tibia fx; hyperlipiemia;; former smoker; hx L lateral calf venous stasis ulcer    Limitations  difficulty walking, impaired functional mobility and transfers, chronic BLE leg swelling and pain; impaired basic ADLs- LB bathing , dressing,  and fitting LB clothing and street shoes, difficulty lifting legs to elevate and comple transfers, impaired IADLs- requires assistance for all productive tasks and home management activities. Decreased social participation- homebound without assistance from family    Patient Stated Goals  get swelling down and keep it from getting worse    Currently in Pain?  No/denies    Pain Onset  More than a month ago    Pain Onset  More than a month ago                   OT Treatments/Exercises (OP) - 09/21/17 0001      ADLs   ADL Education Given  Yes      Manual Therapy   Manual Therapy  Edema management;Manual Lymphatic Drainage (MLD);Compression Bandaging    Manual therapy comments  skin care to LLE w/ low Ph castor oil (palma christi) during MLD. Pt reports CG are providing daily skin care at home.     Manual Lymphatic Drainage (MLD)  MLD to LLE utilizing "short neck: sequence, deep abdominal lymphatics and functional inguinal LN.    Compression Bandaging  Compression wraps from toes to proximal thigh as established. Unable to extend wraps to groin due to Pt's inability to left leg while seated on plinth, or to stand when wrapping.  OT Education - 09/21/17 1628    Education provided  Yes    Education Details  Emphasis of LE self care edu on paid CG edu for compression wrapping, low ph llotion for skin care, and rational for MLD techniques. Good return.    Person(s) Educated  Patient;Caregiver(s)    Methods  Explanation;Demonstration    Comprehension  Verbalized understanding;Returned demonstration          OT Long Term Goals - 08/12/17 1625      OT LONG TERM GOAL #1   Title  Pt mod A w/ lymphedema precautions and prevention principals and strategies with caregiver assistance using printed reference to limit LE progression and infection risk.    Baseline  dependent    Time  2    Period  Weeks    Status  New    Target Date  -- 10th OT visit      OT LONG  TERM GOAL #2   Title  Lymphedema (LE) management/ self-care: Pt able to apply thigh length, multi layered, gradient compression wraps with Maximum caregiver assistance  using correct  techniques within 2 weeks to achieve optimal limb volume reductions in preparation for fitting compression garments/ devices bilaterally.    Baseline  Dependent    Time  2    Period  Weeks    Status  New    Target Date  08/12/17 10th OT visit      OT LONG TERM GOAL #3   Title  Lymphedema (LE) management/ self-care:  Pt to achieve at least 10%  BLE limb volume reductions  during Intensive Phase CDT to limit LE progression, to reduce pain, to improve ADLs performance.    Baseline  dependent    Time  12    Period  Weeks    Status  New    Target Date  11/10/17      OT LONG TERM GOAL #4   Title  Lymphedema (LE) management/ self-care:  Pt to tolerate daily compression wraps, compression garments and/ or HOS devices in keeping w/ prescribed wear regime within 1 week of issue date of each to progress and retain clinical and functional gains and to limit LE progression.    Baseline  dependent    Time  12    Period  Weeks    Status  New    Target Date  11/10/17      OT LONG TERM GOAL #5   Title  Lymphedema (LE) management/ self-care:  During Management Phase CDT Pt to sustain current limb volumes within 3%, and all other clinical gains achieved during OT treatment independently (to control limb swelling and associated pain, to  limit LE progression, to decrease infection risk and to limit further functional decline.    Baseline  dependent    Time  6    Period  Months    Status  New    Target Date  02/13/18              Patient will benefit from skilled therapeutic intervention in order to improve the following deficits and impairments:     Visit Diagnosis: Lymphedema, not elsewhere classified    Problem List Patient Active Problem List   Diagnosis Date Noted  . Acute respiratory failure (HCC)  12/07/2016  . Spontaneous pneumothorax 12/05/2016  . Acute respiratory failure with hypoxia (HCC) 08/07/2016  . Influenza 08/05/2016  . Dyspnea 08/05/2016  . Constipation 08/05/2016  . Back pain 08/05/2016  . Generalized weakness 08/05/2016  .  Intractable back pain 08/01/2016    Loel Dubonnetheresa Krisann Mckenna, MS, OTR/L, [facility] 09/21/17 4:31 PM  [place] [facility] [address] [place], [place], [address] Phone: [phone]   Fax:  [phone]  Name: [name] MRN: [id number] Date of Birth: October 12, 1929

## 2017-09-22 ENCOUNTER — Ambulatory Visit: Payer: Medicare Other | Admitting: Occupational Therapy

## 2017-09-22 DIAGNOSIS — I890 Lymphedema, not elsewhere classified: Secondary | ICD-10-CM | POA: Diagnosis not present

## 2017-09-22 NOTE — Therapy (Signed)
[place] MAIN [facility] SERVICES [address] [place], [place], [address] Phone: [phone]   Fax:  [phone]  Occupational Therapy Treatment Note and Progress Report: Lymphedema Care  Patient Details  Name: [name] MRN: [id number] Date of Birth: 09/11/1929 Referring Provider: Woodroe Chen, PA-C   Encounter Date: 09/22/2017  OT End of Session - 09/22/17 1624    Visit Number  10    Number of Visits  36    Date for OT Re-Evaluation  11/10/17    OT Start Time  0200    OT Stop Time  0258    OT Time Calculation (min)  58 min    Activity Tolerance  Patient tolerated treatment well;Patient limited by pain;Other (comment) BLE weakness and poor functional mobility    Behavior During Therapy  WFL for tasks assessed/performed       Past Medical History:  Diagnosis Date  . COPD (chronic obstructive pulmonary disease) ([place])   . DVT (deep venous thrombosis) ([place])   . Hypertension   . Pneumothorax     Past Surgical History:  Procedure Laterality Date  . ABDOMINAL HYSTERECTOMY    . colon cancer    . HEMIARTHROPLASTY SHOULDER FRACTURE Left   . TIBIA FRACTURE SURGERY Left     There were no vitals filed for this visit.  Subjective Assessment - 09/22/17 1621    Subjective   Pt presents for OT visit 10/36 to address BLE lymphedema (LE) Pt is transported by private caregiver today. Pt presents with compression wraps applied in clinic yesterday in place. Pt denies leg pain. She states she wants to go back to [facility] for "walking clinic". "I need to be able to get up and move around better."    Patient is accompained by:  Family member    Pertinent History  Colon cx; hx of DVT, s/p 2012 L tibia fx; hyperlipiemia;; former smoker; hx L lateral calf venous stasis ulcer    Limitations  difficulty walking, impaired functional mobility and transfers, chronic BLE leg swelling and pain; impaired basic ADLs- LB bathing , dressing, and fitting LB clothing and  street shoes, difficulty lifting legs to elevate and comple transfers, impaired IADLs- requires assistance for all productive tasks and home management activities. Decreased social participation- homebound without assistance from family    Patient Stated Goals  get swelling down and keep it from getting worse    Currently in Pain?  No/denies    Pain Onset  More than a month ago    Pain Onset  More than a month ago                   OT Treatments/Exercises (OP) - 09/22/17 0001      ADLs   ADL Education Given  Yes      Manual Therapy   Manual Therapy  Edema management;Manual Lymphatic Drainage (MLD);Compression Bandaging    Manual therapy comments  skin care to LLE w/ low Ph castor oil (palma christi) during MLD. Pt reports CG are providing daily skin care at home.     Manual Lymphatic Drainage (MLD)  MLD to LLE utilizing "short neck: sequence, deep abdominal lymphatics and functional inguinal LN.    Compression Bandaging  Compression wraps from toes to proximal thigh as established. Unable to extend wraps to groin due to Pt's inability to left leg while seated on plinth, or to stand when wrapping.  OT Education - 09/22/17 1623    Education provided  Yes    Education Details  Cont Pt and CG edu for LE self care throughout session. Emphasis on ther ex edu today.    Person(s) Educated  Patient;Caregiver(s)    Methods  Explanation;Demonstration    Comprehension  Verbalized understanding;Returned demonstration          OT Long Term Goals - 09/22/17 1625      OT LONG TERM GOAL #1   Title  Pt mod A w/ lymphedema precautions and prevention principals and strategies with caregiver assistance using printed reference to limit LE progression and infection risk.    Baseline  dependent 08/25/2017: Goal met for family caregiver- Son.    Time  2    Period  Weeks    Status  Achieved      OT LONG TERM GOAL #2   Title  Lymphedema (LE) management/ self-care: Pt able  to apply thigh length, multi layered, gradient compression wraps with Maximum caregiver assistance  using correct  techniques within 2 weeks to achieve optimal limb volume reductions in preparation for fitting compression garments/ devices bilaterally.    Baseline  Dependent 08/25/2017: Goal Met    Time  2    Period  Weeks    Status  Achieved      OT LONG TERM GOAL #3   Title  Lymphedema (LE) management/ self-care:  Pt to achieve at least 10%  BLE limb volume reductions  during Intensive Phase CDT to limit LE progression, to reduce pain, to improve ADLs performance.    Baseline  dependent  08/25/2017: Partially met for LLE. RLE rx not started yet    Time  12    Period  Weeks    Status  Partially Met      OT LONG TERM GOAL #4   Title  Lymphedema (LE) management/ self-care:  Pt to tolerate daily compression wraps, compression garments and/ or HOS devices in keeping w/ prescribed wear regime within 1 week of issue date of each to progress and retain clinical and functional gains and to limit LE progression.    Baseline  dependent  08/25/2017: partially met- met for wraps only    Time  12    Period  Weeks    Status  Partially Met      OT LONG TERM GOAL #5   Title  Lymphedema (LE) management/ self-care:  During Management Phase CDT Pt to sustain current limb volumes within 3%, and all other clinical gains achieved during OT treatment independently (to control limb swelling and associated pain, to  limit LE progression, to decrease infection risk and to limit further functional decline.    Baseline  dependent    Time  6    Period  Months    Status  On-going            Plan - 09/22/17 1628    Clinical Impression Statement  Pt demonstrating improved active knee flexion in LLE as heavy leg swelling decreases and joint flexibility improves. RLE has minimal functiona movement for knee flexion. Pt attempst to compensate with hiip  fexion and gets minimal overflow    at knee. Pt tolerating all  aspects of manual therapies today including skin carem, MLD and compression wrap to LLE from foot to groin. Her mood is a bilt lighter today, but it's very early days since she lost her spouse,. Cont as per POC. Support Pt and CG  during emotional time of  grief at home and encourage and reassure patient PRN. Carfully monitior burden of care and adjust frequency of sessions PRN. See GOALS section below for progress to date.     Occupational Profile and client history currently impacting functional performance  Generalized weekness limits Pt's unctional mobility and ambulation./ She will benefit from Physical Therapy to address these deficits once LE is complete.    Occupational performance deficits (Please refer to evaluation for details):  ADL's;IADL's;Work;Leisure;Social Participation    Rehab Potential  Fair    Current Impairments/barriers affecting progress:  Pt prognosis without max caregiver assistance with LE self care home program between OT visits is poor. Caregiver attendance, participation in therapy sessions, and daily assistance with LE self care going forward is essential for retaining clinical gains over time.    OT Frequency  3x / week    OT Treatment/Interventions  Self-care/ADL training;Manual lymph drainage;DME and/or AE instruction;Compression bandaging;Patient/family education;Therapeutic exercise;Manual Therapy;Therapeutic activities       Patient will benefit from skilled therapeutic intervention in order to improve the following deficits and impairments:  Abnormal gait, Decreased skin integrity, Decreased knowledge of precautions, Decreased scar mobility, Impaired perceived functional ability, Decreased activity tolerance, Decreased knowledge of use of DME, Decreased strength, Impaired flexibility, Decreased balance, Decreased mobility, Difficulty walking, Obesity, Decreased cognition, Decreased range of motion, Increased edema, Pain  Visit Diagnosis: Lymphedema, not elsewhere  classified    Problem List Patient Active Problem List   Diagnosis Date Noted  . Acute respiratory failure ([place]) 12/07/2016  . Spontaneous pneumothorax 12/05/2016  . Acute respiratory failure with hypoxia ([place]) 08/07/2016  . Influenza 08/05/2016  . Dyspnea 08/05/2016  . Constipation 08/05/2016  . Back pain 08/05/2016  . Generalized weakness 08/05/2016  . Intractable back pain 08/01/2016    Andrey Spearman, MS, OTR/L, [facility] 09/22/17 4:34 PM  [place] MAIN [facility] SERVICES [address] [place], [place], [address] Phone: [phone]   Fax:  [phone]  Name: [name] MRN: [id number] Date of Birth: 02/23/1930

## 2017-09-24 ENCOUNTER — Ambulatory Visit: Payer: Medicare Other | Admitting: Occupational Therapy

## 2017-09-24 DIAGNOSIS — I890 Lymphedema, not elsewhere classified: Secondary | ICD-10-CM

## 2017-09-28 ENCOUNTER — Ambulatory Visit: Payer: Medicare Other | Admitting: Occupational Therapy

## 2017-09-28 NOTE — Therapy (Signed)
[place] MAIN [facility] SERVICES [address] [place], [place], [address] Phone: [phone]   Fax:  [phone]  Occupational Therapy Treatment  Patient Details  Name: [name] MRN: [id number] Date of Birth: [birth year] Referring Provider: Woodroe Chen, PA-C   Encounter Date: 09/24/2017    Past Medical History:  Diagnosis Date  . COPD (chronic obstructive pulmonary disease) ([place])   . DVT (deep venous thrombosis) ([place])   . Hypertension   . Pneumothorax     Past Surgical History:  Procedure Laterality Date  . ABDOMINAL HYSTERECTOMY    . colon cancer    . HEMIARTHROPLASTY SHOULDER FRACTURE Left   . TIBIA FRACTURE SURGERY Left     There were no vitals filed for this visit.                OT Treatments/Exercises (OP) - 09/28/17 0001      ADLs   ADL Education Given  Yes      Manual Therapy   Manual Therapy  Edema management;Manual Lymphatic Drainage (MLD);Compression Bandaging    Manual therapy comments  skin care to LLE w/ low Ph castor oil ([place]) during MLD. Pt reports CG are providing daily skin care at home.     Manual Lymphatic Drainage (MLD)  MLD to LLE utilizing "short neck: sequence, deep abdominal lymphatics and functional inguinal LN.    Compression Bandaging  Compression wraps from toes to proximal thigh as established. Unable to extend wraps to groin due to Pt's inability to left leg while seated on plinth, or to stand when wrapping.              OT Education - 09/28/17 1022    Education provided  Yes          OT Long Term Goals - 09/22/17 1625      OT LONG TERM GOAL #1   Title  Pt mod A w/ lymphedema precautions and prevention principals and strategies with caregiver assistance using printed reference to limit LE progression and infection risk.    Baseline  dependent 08/25/2017: Goal met for family caregiver- Son.    Time  2    Period  Weeks    Status  Achieved      OT LONG  TERM GOAL #2   Title  Lymphedema (LE) management/ self-care: Pt able to apply thigh length, multi layered, gradient compression wraps with Maximum caregiver assistance  using correct  techniques within 2 weeks to achieve optimal limb volume reductions in preparation for fitting compression garments/ devices bilaterally.    Baseline  Dependent 08/25/2017: Goal Met    Time  2    Period  Weeks    Status  Achieved      OT LONG TERM GOAL #3   Title  Lymphedema (LE) management/ self-care:  Pt to achieve at least 10%  BLE limb volume reductions  during Intensive Phase CDT to limit LE progression, to reduce pain, to improve ADLs performance.    Baseline  dependent  08/25/2017: Partially met for LLE. RLE rx not started yet    Time  12    Period  Weeks    Status  Partially Met      OT LONG TERM GOAL #4   Title  Lymphedema (LE) management/ self-care:  Pt to tolerate daily compression wraps, compression garments and/ or HOS devices in keeping w/ prescribed wear regime within 1 week of issue date of each to progress and retain clinical  and functional gains and to limit LE progression.    Baseline  dependent  08/25/2017: partially met- met for wraps only    Time  12    Period  Weeks    Status  Partially Met      OT LONG TERM GOAL #5   Title  Lymphedema (LE) management/ self-care:  During Management Phase CDT Pt to sustain current limb volumes within 3%, and all other clinical gains achieved during OT treatment independently (to control limb swelling and associated pain, to  limit LE progression, to decrease infection risk and to limit further functional decline.    Baseline  dependent    Time  6    Period  Months    Status  On-going              Patient will benefit from skilled therapeutic intervention in order to improve the following deficits and impairments:  Abnormal gait, Decreased skin integrity, Decreased knowledge of precautions, Decreased scar mobility, Impaired perceived functional  ability, Decreased activity tolerance, Decreased knowledge of use of DME, Decreased strength, Impaired flexibility, Decreased balance, Decreased mobility, Difficulty walking, Obesity, Decreased cognition, Decreased range of motion, Increased edema, Pain  Visit Diagnosis: Lymphedema, not elsewhere classified    Problem List Patient Active Problem List   Diagnosis Date Noted  . Acute respiratory failure ([place]) 12/07/2016  . Spontaneous pneumothorax 12/05/2016  . Acute respiratory failure with hypoxia ([place]) 08/07/2016  . Influenza 08/05/2016  . Dyspnea 08/05/2016  . Constipation 08/05/2016  . Back pain 08/05/2016  . Generalized weakness 08/05/2016  . Intractable back pain 08/01/2016    Andrey Spearman, MS, OTR/L, [facility] 09/28/17 10:31 AM  [place] MAIN [facility] SERVICES [address] [place], [place], [address] Phone: [phone]   Fax:  [phone]  Name: [name] MRN: [id number] Date of Birth: 24-Aug-1929

## 2017-09-29 ENCOUNTER — Ambulatory Visit: Payer: Medicare Other | Admitting: Occupational Therapy

## 2017-10-01 ENCOUNTER — Ambulatory Visit: Payer: Medicare Other | Admitting: Occupational Therapy

## 2017-10-01 DIAGNOSIS — I890 Lymphedema, not elsewhere classified: Secondary | ICD-10-CM | POA: Diagnosis not present

## 2017-10-01 NOTE — Therapy (Signed)
[place] MAIN [facility] SERVICES [address] [place], [place], [address] Phone: [phone]   Fax:  [phone]  Occupational Therapy Treatment  Patient Details  Name: [name] MRN: [id number] Date of Birth: 06-Sep-1929 Referring Provider: Woodroe Chen, PA-C   Encounter Date: 10/01/2017    Past Medical History:  Diagnosis Date  . COPD (chronic obstructive pulmonary disease) ([place])   . DVT (deep venous thrombosis) ([place])   . Hypertension   . Pneumothorax     Past Surgical History:  Procedure Laterality Date  . ABDOMINAL HYSTERECTOMY    . colon cancer    . HEMIARTHROPLASTY SHOULDER FRACTURE Left   . TIBIA FRACTURE SURGERY Left     There were no vitals filed for this visit.  Subjective Assessment - 10/01/17 1642    Subjective   Pt presents for OT visit 12/36 to address BLE lymphedema (LE) Pt is transported by a third private caregiver today.  Pt presents w/ compression wraps in place. Caregiver states, "Initially her swelling seemed to go way down, but now she's retaining a lot of fluid in her legs again."  Caregiver , Pt and OT discussed plan     for Pt to see PCP to discuss fluid retention and intermittent SOB at rest.    Patient is accompained by:  Family member    Pertinent History  Colon cx; hx of DVT, s/p 2012 L tibia fx; hyperlipiemia;; former smoker; hx L lateral calf venous stasis ulcer    Limitations  difficulty walking, impaired functional mobility and transfers, chronic BLE leg swelling and pain; impaired basic ADLs- LB bathing , dressing, and fitting LB clothing and street shoes, difficulty lifting legs to elevate and comple transfers, impaired IADLs- requires assistance for all productive tasks and home management activities. Decreased social participation- homebound without assistance from family    Patient Stated Goals  get swelling down and keep it from getting worse    Currently in Pain?  No/denies    Pain Onset  More  than a month ago    Pain Onset  More than a month ago          LYMPHEDEMA/ONCOLOGY QUESTIONNAIRE - 10/01/17 1645      Right Lower Extremity Lymphedema   Other  RLE ankle to politeal (A-D) limb volume measures 4548.22 ml. RLE knee to groin (E-G) volume measures 8549.16 ml. RLE full leg (A-G ) volume measures 13097.39 m,l.    Other  RLE A-D volume is increased by 1.47% since    initially measured on 08/17/2017. RLE knee to groin volume is decreased by 0.23% since initially measures. RLE limb volume for full leg from ankle to groin is increased by 0.36%N SINCE COMMENCING cdt TO lle. tO DATE WE HAVE NOT WRAPPED rle. tHESE VALUES SHOW LIMB VOLUME HAVE REMAINED ESSENTIALLY STABLE.       Left Lower Extremity Lymphedema   Other  LLE ankle to politeal (A-D) limb volume measures 3946.80 ml. LLE knee to groin (E-G)      volume measures 9121.90m. LLE full leg (A-G ) volume measures 1476546.50   Other  LLE A-D volume is decreased by 9.95% since initially measured on 08/17/2017. LLE knee to groin volume is decreased by 4.52% since initially measures. LLE limb volume for full leg from ankle to groin is decreased by 6.23% since initially commencing Rx.               OT Treatments/Exercises (OP) - 10/01/17 0001  ADLs   ADL Education Given  Yes      Manual Therapy   Manual Therapy  Edema management;Compression Bandaging    Edema Management  BLE comparative limb volumetrics    Compression Bandaging  Compression wraps from toes to proximal thigh as established. Unable to extend wraps to groin due to Pt's inability to left leg while seated on plinth, or to stand when wrapping.                   OT Long Term Goals - 09/22/17 1625      OT LONG TERM GOAL #1   Title  Pt mod A w/ lymphedema precautions and prevention principals and strategies with caregiver assistance using printed reference to limit LE progression and infection risk.    Baseline  dependent 08/25/2017: Goal met for  family caregiver- Son.    Time  2    Period  Weeks    Status  Achieved      OT LONG TERM GOAL #2   Title  Lymphedema (LE) management/ self-care: Pt able to apply thigh length, multi layered, gradient compression wraps with Maximum caregiver assistance  using correct  techniques within 2 weeks to achieve optimal limb volume reductions in preparation for fitting compression garments/ devices bilaterally.    Baseline  Dependent 08/25/2017: Goal Met    Time  2    Period  Weeks    Status  Achieved      OT LONG TERM GOAL #3   Title  Lymphedema (LE) management/ self-care:  Pt to achieve at least 10%  BLE limb volume reductions  during Intensive Phase CDT to limit LE progression, to reduce pain, to improve ADLs performance.    Baseline  dependent  08/25/2017: Partially met for LLE. RLE rx not started yet    Time  12    Period  Weeks    Status  Partially Met      OT LONG TERM GOAL #4   Title  Lymphedema (LE) management/ self-care:  Pt to tolerate daily compression wraps, compression garments and/ or HOS devices in keeping w/ prescribed wear regime within 1 week of issue date of each to progress and retain clinical and functional gains and to limit LE progression.    Baseline  dependent  08/25/2017: partially met- met for wraps only    Time  12    Period  Weeks    Status  Partially Met      OT LONG TERM GOAL #5   Title  Lymphedema (LE) management/ self-care:  During Management Phase CDT Pt to sustain current limb volumes within 3%, and all other clinical gains achieved during OT treatment independently (to control limb swelling and associated pain, to  limit LE progression, to decrease infection risk and to limit further functional decline.    Baseline  dependent    Time  6    Period  Months    Status  On-going            Plan - 10/01/17 1649    Clinical Impression Statement  Completed BLE comparative limb voluemtrics to determine progress towards limb volume reduction goal. RLE A-D  volume is increased by 1.47% since initially measured on 08/17/2017. RLE knee to groin volume is decreased by 0.23% since initially measures. RLE limb volume for full leg from ankle to groin is increased by 0.36% since commencing CDT to LLE. To date the RLE has not been treated directly and volumes have remeained essentially  unchanged. Tissue integrity in RLE is also unchanged with dense, pitting edema and palpable fibrosis and peau de orange pebbled texture from feet to groin. LLE A-D volume is decreased by 9.95% since initially measured on 08/17/2017. LLE knee to groin volume is decreased by 4.52% since initially measures. LLE limb volume for full leg from ankle to groin is decreased by 6.23% since initially commencing Rx.  Pt has not met 10% volumetric goal for Rx LLE to date. Fluid retention issues coupled w/ Pt's report of intermittent  SOB at rest warrant  further investigation by PCP. Pt agrees to schedule an appointment w/ PCP ASAP.    Occupational Profile and client history currently impacting functional performance  Generalized weekness limits Pt's unctional mobility and ambulation./ She will benefit from Physical Therapy to address these deficits once LE is complete.    Occupational performance deficits (Please refer to evaluation for details):  ADL's;IADL's;Work;Leisure;Social Participation    Rehab Potential  Fair    Current Impairments/barriers affecting progress:  Pt prognosis without max caregiver assistance with LE self care home program between OT visits is poor. Caregiver attendance, participation in therapy sessions, and daily assistance with LE self care going forward is essential for retaining clinical gains over time.    OT Frequency  3x / week    OT Treatment/Interventions  Self-care/ADL training;Manual lymph drainage;DME and/or AE instruction;Compression bandaging;Patient/family education;Therapeutic exercise;Manual Therapy;Therapeutic activities       Patient will benefit from  skilled therapeutic intervention in order to improve the following deficits and impairments:  Abnormal gait, Decreased skin integrity, Decreased knowledge of precautions, Decreased scar mobility, Impaired perceived functional ability, Decreased activity tolerance, Decreased knowledge of use of DME, Decreased strength, Impaired flexibility, Decreased balance, Decreased mobility, Difficulty walking, Obesity, Decreased cognition, Decreased range of motion, Increased edema, Pain  Visit Diagnosis: Lymphedema, not elsewhere classified    Problem List Patient Active Problem List   Diagnosis Date Noted  . Acute respiratory failure ([place]) 12/07/2016  . Spontaneous pneumothorax 12/05/2016  . Acute respiratory failure with hypoxia ([place]) 08/07/2016  . Influenza 08/05/2016  . Dyspnea 08/05/2016  . Constipation 08/05/2016  . Back pain 08/05/2016  . Generalized weakness 08/05/2016  . Intractable back pain 08/01/2016    Andrey Spearman, MS, OTR/L, [facility] 10/01/17 5:01 PM   [place] MAIN [facility] SERVICES [name] [place], [place], [address] Phone: [phone]   Fax:  [phone]  Name: [name] MRN: [id number] Date of Birth: 08/05/1929

## 2017-10-05 ENCOUNTER — Ambulatory Visit: Payer: Medicare Other | Attending: Physician Assistant | Admitting: Occupational Therapy

## 2017-10-05 DIAGNOSIS — I890 Lymphedema, not elsewhere classified: Secondary | ICD-10-CM | POA: Insufficient documentation

## 2017-10-05 NOTE — Therapy (Signed)
[place] MAIN [facility] SERVICES [address] [place], [place], [address] Phone: [phone]   Fax:  [phone]  Occupational Therapy Treatment  Patient Details  Name: [name] MRN: [id number] Date of Birth: May 15, 1930 Referring Provider: Woodroe Chen, PA-C   Encounter Date: 10/05/2017  OT End of Session - 10/05/17 1639    Visit Number  12    Number of Visits  36    Date for OT Re-Evaluation  11/10/17    OT Start Time  0203    OT Stop Time  0320    OT Time Calculation (min)  77 min    Activity Tolerance  Patient tolerated treatment well;Patient limited by pain;Other (comment) BLE weakness and poor functional mobility    Behavior During Therapy  WFL for tasks assessed/performed       Past Medical History:  Diagnosis Date  . COPD (chronic obstructive pulmonary disease) ([place])   . DVT (deep venous thrombosis) ([place])   . Hypertension   . Pneumothorax     Past Surgical History:  Procedure Laterality Date  . ABDOMINAL HYSTERECTOMY    . colon cancer    . HEMIARTHROPLASTY SHOULDER FRACTURE Left   . TIBIA FRACTURE SURGERY Left     There were no vitals filed for this visit.  Subjective Assessment - 10/05/17 1628    Subjective   Pt presents for OT visit 13/36 to address BLE lymphedema (LE) Pt is accompainied by private caregiver, [name] , today. She presents with wraps in place.    Patient is accompained by:  Family member    Pertinent History  Colon cx; hx of DVT, s/p 2012 L tibia fx; hyperlipiemia;; former smoker; hx L lateral calf venous stasis ulcer    Limitations  difficulty walking, impaired functional mobility and transfers, chronic BLE leg swelling and pain; impaired basic ADLs- LB bathing , dressing, and fitting LB clothing and street shoes, difficulty lifting legs to elevate and comple transfers, impaired IADLs- requires assistance for all productive tasks and home management activities. Decreased social participation- homebound  without assistance from family    Patient Stated Goals  get swelling down and keep it from getting worse    Currently in Pain?  No/denies    Pain Onset  More than a month ago    Pain Onset  More than a month ago                   OT Treatments/Exercises (OP) - 10/05/17 0001      ADLs   ADL Education Given  Yes      Manual Therapy   Manual Therapy  Edema management;Manual Lymphatic Drainage (MLD);Compression Bandaging    Manual Lymphatic Drainage (MLD)  MLD to LLE utilizing "short neck: sequence, deep abdominal lymphatics and functional inguinal LN.    Compression Bandaging  Five layer , knee length compression wrap to RLE today consisting of toe wrap,  Rosidal foam over stockinett from foot to knee. *, 10 and 12 cm wraps applied in gradient        configuration. Applied LLE compression wraps from foot to groin as established.                   OT Long Term Goals - 09/22/17 1625      OT LONG TERM GOAL #1   Title  Pt mod A w/ lymphedema precautions and prevention principals and strategies with caregiver assistance using printed reference to limit LE progression and  infection risk.    Baseline  dependent 08/25/2017: Goal met for family caregiver- Son.    Time  2    Period  Weeks    Status  Achieved      OT LONG TERM GOAL #2   Title  Lymphedema (LE) management/ self-care: Pt able to apply thigh length, multi layered, gradient compression wraps with Maximum caregiver assistance  using correct  techniques within 2 weeks to achieve optimal limb volume reductions in preparation for fitting compression garments/ devices bilaterally.    Baseline  Dependent 08/25/2017: Goal Met    Time  2    Period  Weeks    Status  Achieved      OT LONG TERM GOAL #3   Title  Lymphedema (LE) management/ self-care:  Pt to achieve at least 10%  BLE limb volume reductions  during Intensive Phase CDT to limit LE progression, to reduce pain, to improve ADLs performance.    Baseline   dependent  08/25/2017: Partially met for LLE. RLE rx not started yet    Time  12    Period  Weeks    Status  Partially Met      OT LONG TERM GOAL #4   Title  Lymphedema (LE) management/ self-care:  Pt to tolerate daily compression wraps, compression garments and/ or HOS devices in keeping w/ prescribed wear regime within 1 week of issue date of each to progress and retain clinical and functional gains and to limit LE progression.    Baseline  dependent  08/25/2017: partially met- met for wraps only    Time  12    Period  Weeks    Status  Partially Met      OT LONG TERM GOAL #5   Title  Lymphedema (LE) management/ self-care:  During Management Phase CDT Pt to sustain current limb volumes within 3%, and all other clinical gains achieved during OT treatment independently (to control limb swelling and associated pain, to  limit LE progression, to decrease infection risk and to limit further functional decline.    Baseline  dependent    Time  6    Period  Months    Status  On-going            Plan - 10/05/17 1634    Clinical Impression Statement   Pt and CG edu for volumetric measurements results RLE w/ mild lymphorrhea today, so applied compression wraps to knee only on that leg. Pt and CG instructed to remobe RLE wrap if she experiences any atypical SOB and/ or acute leg pain. Manual therapy and compression to LLE as established.  knee length compression wrap to RLE today consisting of toe wrap,  Rosidal foam over stockinett from foot to knee. *, 10 and 12 cm wraps applied in gradient        configuration. Applied LLE compression wraps from foot to groin as established.  Carefully monitor RLE condidtion each session.    Occupational Profile and client history currently impacting functional performance  Generalized weekness limits Pt's unctional mobility and ambulation./ She will benefit from Physical Therapy to address these deficits once LE is complete.    Occupational performance deficits  (Please refer to evaluation for details):  ADL's;IADL's;Work;Leisure;Social Participation    Rehab Potential  Fair    Current Impairments/barriers affecting progress:  Pt prognosis without max caregiver assistance with LE self care home program between OT visits is poor. Caregiver attendance, participation in therapy sessions, and daily assistance with LE self care  going forward is essential for retaining clinical gains over time.    OT Frequency  3x / week    OT Treatment/Interventions  Self-care/ADL training;Manual lymph drainage;DME and/or AE instruction;Compression bandaging;Patient/family education;Therapeutic exercise;Manual Therapy;Therapeutic activities       Patient will benefit from skilled therapeutic intervention in order to improve the following deficits and impairments:  Abnormal gait, Decreased skin integrity, Decreased knowledge of precautions, Decreased scar mobility, Impaired perceived functional ability, Decreased activity tolerance, Decreased knowledge of use of DME, Decreased strength, Impaired flexibility, Decreased balance, Decreased mobility, Difficulty walking, Obesity, Decreased cognition, Decreased range of motion, Increased edema, Pain  Visit Diagnosis: Lymphedema, not elsewhere classified    Problem List Patient Active Problem List   Diagnosis Date Noted  . Acute respiratory failure ([place]) 12/07/2016  . Spontaneous pneumothorax 12/05/2016  . Acute respiratory failure with hypoxia ([place]) 08/07/2016  . Influenza 08/05/2016  . Dyspnea 08/05/2016  . Constipation 08/05/2016  . Back pain 08/05/2016  . Generalized weakness 08/05/2016  . Intractable back pain 08/01/2016    Andrey Spearman, MS, OTR/L, [facility] 10/05/17 4:41 PM  [place] MAIN [facility] SERVICES [address] [place], [place], [address] Phone: [phone]   Fax:  [phone]  Name: DALANIE KISNER MRN: [id number] Date of Birth: April 22, 1930

## 2017-10-06 ENCOUNTER — Ambulatory Visit: Payer: Medicare Other | Admitting: Occupational Therapy

## 2017-10-06 DIAGNOSIS — I890 Lymphedema, not elsewhere classified: Secondary | ICD-10-CM | POA: Diagnosis not present

## 2017-10-06 NOTE — Therapy (Signed)
[name] MA[facility] SERVICES [address] [place], [place], [address] Phone: [phone]   Fax:  [phone]  Occupational Therapy Treatment  Patient Details  Name: [name] MRN: [id number] Date of Birth: 09/15/1929 Referring Provider: Woodroe Chen, PA-C   Encounter Date: 10/06/2017  OT End of Session - 10/06/17 1716    Visit Number  13    Number of Visits  36    Date for OT Re-Evaluation  11/10/17    OT Start Time  0215    OT Stop Time  0315    OT Time Calculation (min)  60 min    Activity Tolerance  Patient tolerated treatment well;Patient limited by pain;Other (comment) BLE weakness and poor functional mobility    Behavior During Therapy  WFL for tasks assessed/performed       Past Medical History:  Diagnosis Date  . COPD (chronic obstructive pulmonary disease) ([place])   . DVT (deep venous thrombosis) ([place])   . Hypertension   . Pneumothorax     Past Surgical History:  Procedure Laterality Date  . ABDOMINAL HYSTERECTOMY    . colon cancer    . HEMIARTHROPLASTY SHOULDER FRACTURE Left   . TIBIA FRACTURE SURGERY Left     There were no vitals filed for this visit.  Subjective Assessment - 10/06/17 1700    Subjective   Pt presents for OT visit 14/36 to address BLE lymphedema (LE) Pt is accompainied by private caregiver, [name] , today. She presents with knee length wraps in place on RLE and thigh high wraps in place on LE. Pt and OT discussed Pt's concerns re freferral to PT for LB strenthening for ambulation and functional mobility. OT and Pt discussed concerns regarding excess fluid retention. OT sent note home to son, [name], with recommendations for Pt to see PCP for PT referral and request to review meds for diuresis.     Patient is accompained by:  Family member    Pertinent History  Colon cx; hx of DVT, s/p 2012 L tibia fx; hyperlipiemia;; former smoker; hx L lateral calf venous stasis ulcer    Limitations  difficulty  walking, impaired functional mobility and transfers, chronic BLE leg swelling and pain; impaired basic ADLs- LB bathing , dressing, and fitting LB clothing and street shoes, difficulty lifting legs to elevate and comple transfers, impaired IADLs- requires assistance for all productive tasks and home management activities. Decreased social participation- homebound without assistance from family    Patient Stated Goals  get swelling down and keep it from getting worse    Currently in Pain?  No/denies    Pain Onset  More than a month ago    Pain Onset  More than a month ago                   OT Treatments/Exercises (OP) - 10/06/17 0001      ADLs   ADL Education Given  Yes      Manual Therapy   Manual Therapy  Edema management    Manual Lymphatic Drainage (MLD)  MLD to LLE utilizing "short neck: sequence, deep abdominal lymphatics and functional inguinal LN.    Compression Bandaging  Five layer , knee length compression wrap to RLE today consisting of toe wrap,  Rosidal foam over stockinett from foot to knee. *, 10 and 12 cm wraps applied in gradient        configuration. Applied LLE compression wraps from foot to groin as established.  OT Education - 10/06/17 1715    Education provided  Yes    Education Details  Continued skilled Pt/caregiver education  And LE ADL training throughout visit for lymphedema self care/ home program, including compression wrapping, compression garment and device wear/care, lymphatic pumping ther ex, simple self-MLD, and skin care. Discussed progress towards goals.     Person(s) Educated  Patient    Methods  Explanation;Demonstration    Comprehension  Verbalized understanding;Returned demonstration          OT Long Term Goals - 09/22/17 1625      OT LONG TERM GOAL #1   Title  Pt mod A w/ lymphedema precautions and prevention principals and strategies with caregiver assistance using printed reference to limit LE progression and  infection risk.    Baseline  dependent 08/25/2017: Goal met for family caregiver- Son.    Time  2    Period  Weeks    Status  Achieved      OT LONG TERM GOAL #2   Title  Lymphedema (LE) management/ self-care: Pt able to apply thigh length, multi layered, gradient compression wraps with Maximum caregiver assistance  using correct  techniques within 2 weeks to achieve optimal limb volume reductions in preparation for fitting compression garments/ devices bilaterally.    Baseline  Dependent 08/25/2017: Goal Met    Time  2    Period  Weeks    Status  Achieved      OT LONG TERM GOAL #3   Title  Lymphedema (LE) management/ self-care:  Pt to achieve at least 10%  BLE limb volume reductions  during Intensive Phase CDT to limit LE progression, to reduce pain, to improve ADLs performance.    Baseline  dependent  08/25/2017: Partially met for LLE. RLE rx not started yet    Time  12    Period  Weeks    Status  Partially Met      OT LONG TERM GOAL #4   Title  Lymphedema (LE) management/ self-care:  Pt to tolerate daily compression wraps, compression garments and/ or HOS devices in keeping w/ prescribed wear regime within 1 week of issue date of each to progress and retain clinical and functional gains and to limit LE progression.    Baseline  dependent  08/25/2017: partially met- met for wraps only    Time  12    Period  Weeks    Status  Partially Met      OT LONG TERM GOAL #5   Title  Lymphedema (LE) management/ self-care:  During Management Phase CDT Pt to sustain current limb volumes within 3%, and all other clinical gains achieved during OT treatment independently (to control limb swelling and associated pain, to  limit LE progression, to decrease infection risk and to limit further functional decline.    Baseline  dependent    Time  6    Period  Months    Status  On-going            Plan - 10/06/17 1717    Clinical Impression Statement  Pt ale to tolerate B wraps overnight without  difficulty. RLE presenting with slight decrease in swelling and tissue density w/ overnight wraps below knees. Discussed  considerations reguarding compression garment recommendations. Sent note home   to son, [name] recommending Pt to resumePT for LE strengthening and functional mobility/ ambulation, and consult w/ PCP or cardiologist re increased fluid retention. Considering knee length compression wraps only . Cont as per POC.  Occupational Profile and client history currently impacting functional performance  Generalized weekness limits Pt's unctional mobility and ambulation./ She will benefit from Physical Therapy to address these deficits once LE is complete.    Occupational performance deficits (Please refer to evaluation for details):  ADL's;IADL's;Work;Leisure;Social Participation    Rehab Potential  Fair    Current Impairments/barriers affecting progress:  Pt prognosis without max caregiver assistance with LE self care home program between OT visits is poor. Caregiver attendance, participation in therapy sessions, and daily assistance with LE self care going forward is essential for retaining clinical gains over time.    OT Frequency  3x / week    OT Treatment/Interventions  Self-care/ADL training;Manual lymph drainage;DME and/or AE instruction;Compression bandaging;Patient/family education;Therapeutic exercise;Manual Therapy;Therapeutic activities       Patient will benefit from skilled therapeutic intervention in order to improve the following deficits and impairments:  Abnormal gait, Decreased skin integrity, Decreased knowledge of precautions, Decreased scar mobility, Impaired perceived functional ability, Decreased activity tolerance, Decreased knowledge of use of DME, Decreased strength, Impaired flexibility, Decreased balance, Decreased mobility, Difficulty walking, Obesity, Decreased cognition, Decreased range of motion, Increased edema, Pain  Visit Diagnosis: Lymphedema, not  elsewhere classified    Problem List Patient Active Problem List   Diagnosis Date Noted  . Acute respiratory failure ([place]) 12/07/2016  . Spontaneous pneumothorax 12/05/2016  . Acute respiratory failure with hypoxia ([place]) 08/07/2016  . Influenza 08/05/2016  . Dyspnea 08/05/2016  . Constipation 08/05/2016  . Back pain 08/05/2016  . Generalized weakness 08/05/2016  . Intractable back pain 08/01/2016    Andrey Spearman, MS, OTR/L, [facility] 10/06/17 5:22 PM   [place] MAIN [facility] SERVICES [address] [place], [place], [address] Phone: [phone]   Fax:  [phone]  Name: [name] MRN: [id number] Date of Birth: 04-14-1930

## 2017-10-08 ENCOUNTER — Ambulatory Visit: Payer: Medicare Other | Admitting: Occupational Therapy

## 2017-10-08 DIAGNOSIS — I890 Lymphedema, not elsewhere classified: Secondary | ICD-10-CM | POA: Diagnosis not present

## 2017-10-08 NOTE — Therapy (Signed)
[place] MAIN [facility] SERVICES [address] [place], [place], [address] Phone: [phone]   Fax:  [phone]  Occupational Therapy Treatment  Patient Details  Name: [name] MRN: [id number] Date of Birth: Mar 30, 1930 Referring Provider: Woodroe Chen, PA-C   Encounter Date: 10/08/2017  OT End of Session - 10/08/17 1643    Visit Number  14    Number of Visits  36    Date for OT Re-Evaluation  11/10/17    OT Start Time  0205    OT Stop Time  0305    OT Time Calculation (min)  60 min    Activity Tolerance  Patient tolerated treatment well;Patient limited by pain;Other (comment) BLE weakness and poor functional mobility    Behavior During Therapy  WFL for tasks assessed/performed       Past Medical History:  Diagnosis Date  . COPD (chronic obstructive pulmonary disease) ([place])   . DVT (deep venous thrombosis) ([place])   . Hypertension   . Pneumothorax     Past Surgical History:  Procedure Laterality Date  . ABDOMINAL HYSTERECTOMY    . colon cancer    . HEMIARTHROPLASTY SHOULDER FRACTURE Left   . TIBIA FRACTURE SURGERY Left     There were no vitals filed for this visit.  Subjective Assessment - 10/08/17 1639    Subjective   Pt presents for OT visit 14/36 to address BLE lymphedema (LE) Pt is accompainied by private caregiver, [name] , today. She presents with thigh high wraps in place on LE only. Pt reports she had caregiver remove RLE wraps   yesterday when skin started "burning".  CG reports Pt has PT evaluation session at [facility] in [place] next week.     Pertinent History  Colon cx; hx of DVT, s/p 2012 L tibia fx; hyperlipiemia;; former smoker; hx L lateral calf venous stasis ulcer    Limitations  difficulty walking, impaired functional mobility and transfers, chronic BLE leg swelling and pain; impaired basic ADLs- LB bathing , dressing, and fitting LB clothing and street shoes, difficulty lifting legs to elevate  and comple transfers, impaired IADLs- requires assistance for all productive tasks and home management activities. Decreased social participation- homebound without assistance from family    Patient Stated Goals  get swelling down and keep it from getting worse    Currently in Pain?  No/denies    Pain Onset  --    Pain Onset  More than a month ago                   OT Treatments/Exercises (OP) - 10/08/17 0001      ADLs   ADL Education Given  Yes      Manual Therapy   Manual Therapy  Edema management;Compression Bandaging;Manual Lymphatic Drainage (MLD)    Manual Lymphatic Drainage (MLD)  MLD to LLE utilizing "short neck: sequence, deep abdominal lymphatics and functional inguinal LN.    Compression Bandaging  LLE gradient compression wrap from toes to thigh as established             OT Education - 10/08/17 1643    Education provided  Yes    Education Details  Continued skilled Pt/caregiver education  And LE ADL training throughout visit for lymphedema self care/ home program, including compression wrapping, compression garment and device wear/care, lymphatic pumping ther ex, simple self-MLD, and skin care. Discussed progress towards goals.     Person(s) Educated  Patient  Methods  Explanation;Demonstration    Comprehension  Verbalized understanding;Returned demonstration          OT Long Term Goals - 09/22/17 1625      OT LONG TERM GOAL #1   Title  Pt mod A w/ lymphedema precautions and prevention principals and strategies with caregiver assistance using printed reference to limit LE progression and infection risk.    Baseline  dependent 08/25/2017: Goal met for family caregiver- Son.    Time  2    Period  Weeks    Status  Achieved      OT LONG TERM GOAL #2   Title  Lymphedema (LE) management/ self-care: Pt able to apply thigh length, multi layered, gradient compression wraps with Maximum caregiver assistance  using correct  techniques within 2 weeks to  achieve optimal limb volume reductions in preparation for fitting compression garments/ devices bilaterally.    Baseline  Dependent 08/25/2017: Goal Met    Time  2    Period  Weeks    Status  Achieved      OT LONG TERM GOAL #3   Title  Lymphedema (LE) management/ self-care:  Pt to achieve at least 10%  BLE limb volume reductions  during Intensive Phase CDT to limit LE progression, to reduce pain, to improve ADLs performance.    Baseline  dependent  08/25/2017: Partially met for LLE. RLE rx not started yet    Time  12    Period  Weeks    Status  Partially Met      OT LONG TERM GOAL #4   Title  Lymphedema (LE) management/ self-care:  Pt to tolerate daily compression wraps, compression garments and/ or HOS devices in keeping w/ prescribed wear regime within 1 week of issue date of each to progress and retain clinical and functional gains and to limit LE progression.    Baseline  dependent  08/25/2017: partially met- met for wraps only    Time  12    Period  Weeks    Status  Partially Met      OT LONG TERM GOAL #5   Title  Lymphedema (LE) management/ self-care:  During Management Phase CDT Pt to sustain current limb volumes within 3%, and all other clinical gains achieved during OT treatment independently (to control limb swelling and associated pain, to  limit LE progression, to decrease infection risk and to limit further functional decline.    Baseline  dependent    Time  6    Period  Months    Status  On-going            Plan - 10/08/17 1644    Clinical Impression Statement  LLE is well decongested as Pt had not removed compression wraps during visit interval.RLE swelling is mildly decreased below the knee, but Pt had difficulty tolerating compression on that leg due to burning sesnation. Pt, caregiver and OT in agreement with plan to complete measurements for custom  elastic compression garment next visit. We'll start with a knee length garment as it is least expensive, .     Occupational Profile and client history currently impacting functional performance  Generalized weekness limits Pt's unctional mobility and ambulation./ She will benefit from Physical Therapy to address these deficits once LE is complete.    Occupational performance deficits (Please refer to evaluation for details):  ADL's;IADL's;Work;Leisure;Social Participation    Rehab Potential  Fair    Current Impairments/barriers affecting progress:  Pt prognosis without max caregiver assistance with  LE self care home program between OT visits is poor. Caregiver attendance, participation in therapy sessions, and daily assistance with LE self care going forward is essential for retaining clinical gains over time.    OT Frequency  3x / week    OT Treatment/Interventions  Self-care/ADL training;Manual lymph drainage;DME and/or AE instruction;Compression bandaging;Patient/family education;Therapeutic exercise;Manual Therapy;Therapeutic activities       Patient will benefit from skilled therapeutic intervention in order to improve the following deficits and impairments:  Abnormal gait, Decreased skin integrity, Decreased knowledge of precautions, Decreased scar mobility, Impaired perceived functional ability, Decreased activity tolerance, Decreased knowledge of use of DME, Decreased strength, Impaired flexibility, Decreased balance, Decreased mobility, Difficulty walking, Obesity, Decreased cognition, Decreased range of motion, Increased edema, Pain  Visit Diagnosis: Lymphedema, not elsewhere classified    Problem List Patient Active Problem List   Diagnosis Date Noted  . Acute respiratory failure ([place]) 12/07/2016  . Spontaneous pneumothorax 12/05/2016  . Acute respiratory failure with hypoxia ([place]) 08/07/2016  . Influenza 08/05/2016  . Dyspnea 08/05/2016  . Constipation 08/05/2016  . Back pain 08/05/2016  . Generalized weakness 08/05/2016  . Intractable back pain 08/01/2016    Andrey Spearman, MS,  OTR/L, [facility] 10/08/17 4:48 PM   [place] MAIN [facility] SERVICES [address] [place], [place], [address] Phone: [phone]   Fax:  [phone]  Name: ROSINA CRESSLER MRN: [id number] Date of Birth: 1930/01/21

## 2017-10-12 ENCOUNTER — Ambulatory Visit: Payer: Medicare Other | Admitting: Occupational Therapy

## 2017-10-13 ENCOUNTER — Ambulatory Visit: Payer: Medicare Other | Admitting: Occupational Therapy

## 2017-10-13 DIAGNOSIS — I890 Lymphedema, not elsewhere classified: Secondary | ICD-10-CM

## 2017-10-13 NOTE — Therapy (Signed)
[place] MAIN [facility] SERVICES [address] [place], [place], [address] Phone: [phone]   Fax:  [phone]  Occupational Therapy Treatment  Patient Details  Name: CHANIN FRUMKIN MRN: [id number] Date of Birth: July 01, 1930 Referring Provider: Woodroe Chen, PA-C   Encounter Date: 10/13/2017  OT End of Session - 10/13/17 1705    Visit Number  15    Number of Visits  36    Date for OT Re-Evaluation  11/10/17    OT Start Time  0230    OT Stop Time  0330    OT Time Calculation (min)  60 min    Activity Tolerance  Patient tolerated treatment well;Patient limited by pain;Other (comment) BLE weakness and poor functional mobility    Behavior During Therapy  WFL for tasks assessed/performed       Past Medical History:  Diagnosis Date  . COPD (chronic obstructive pulmonary disease) ([place])   . DVT (deep venous thrombosis) ([place])   . Hypertension   . Pneumothorax     Past Surgical History:  Procedure Laterality Date  . ABDOMINAL HYSTERECTOMY    . colon cancer    . HEMIARTHROPLASTY SHOULDER FRACTURE Left   . TIBIA FRACTURE SURGERY Left     There were no vitals filed for this visit.  Subjective Assessment - 10/13/17 1700    Subjective   Pt presents for OT visit 15/36 to address BLE lymphedema (LE) Pt is accompainied by private caregiver, [name] , today. She presents with thigh high wraps in place on LE only. Pt reports that she saw [facility] PT in [place] this morning, for initial evaluation, and plan is to return to PT 2 x weekly. She was unsure aboujt time line.     Pertinent History  Colon cx; hx of DVT, s/p 2012 L tibia fx; hyperlipiemia;; former smoker; hx L lateral calf venous stasis ulcer    Limitations  difficulty walking, impaired functional mobility and transfers, chronic BLE leg swelling and pain; impaired basic ADLs- LB bathing , dressing, and fitting LB clothing and street shoes, difficulty lifting legs to elevate and comple transfers,  impaired IADLs- requires assistance for all productive tasks and home management activities. Decreased social participation- homebound without assistance from family    Patient Stated Goals  get swelling down and keep it from getting worse    Currently in Pain?  No/denies    Pain Score  -- Mod-severe SOB in supine    Pain Location  Other (Comment)    Pain Type  Chronic pain                   OT Treatments/Exercises (OP) - 10/13/17 0001      ADLs   ADL Education Given  Yes      Manual Therapy   Manual Therapy  Edema management;Compression Bandaging;Manual Lymphatic Drainage (MLD)    Manual therapy comments  skin care to LLE throughout MLD.    Edema Management  anatomical measurements for LLE daytime and HOS  compression garments/ devices    Manual Lymphatic Drainage (MLD)  MLD to LLE utilizing "short neck: sequence, deep abdominal lymphatics and functional inguinal LN.    Compression Bandaging  LLE gradient compression wrap from toes to thigh as established             OT Education - 10/13/17 1703    Education provided  Yes    Education Details  Pt and caregiver training for LE self care w/ emphasis  on education re options for compression garments , recommended specifications, and process for procuring through DME vendor.     Person(s) Educated  Patient;Caregiver(s)    Methods  Explanation;Demonstration    Comprehension  Verbalized understanding;Returned demonstration          OT Long Term Goals - 09/22/17 1625      OT LONG TERM GOAL #1   Title  Pt mod A w/ lymphedema precautions and prevention principals and strategies with caregiver assistance using printed reference to limit LE progression and infection risk.    Baseline  dependent 08/25/2017: Goal met for family caregiver- Son.    Time  2    Period  Weeks    Status  Achieved      OT LONG TERM GOAL #2   Title  Lymphedema (LE) management/ self-care: Pt able to apply thigh length, multi layered, gradient  compression wraps with Maximum caregiver assistance  using correct  techniques within 2 weeks to achieve optimal limb volume reductions in preparation for fitting compression garments/ devices bilaterally.    Baseline  Dependent 08/25/2017: Goal Met    Time  2    Period  Weeks    Status  Achieved      OT LONG TERM GOAL #3   Title  Lymphedema (LE) management/ self-care:  Pt to achieve at least 10%  BLE limb volume reductions  during Intensive Phase CDT to limit LE progression, to reduce pain, to improve ADLs performance.    Baseline  dependent  08/25/2017: Partially met for LLE. RLE rx not started yet    Time  12    Period  Weeks    Status  Partially Met      OT LONG TERM GOAL #4   Title  Lymphedema (LE) management/ self-care:  Pt to tolerate daily compression wraps, compression garments and/ or HOS devices in keeping w/ prescribed wear regime within 1 week of issue date of each to progress and retain clinical and functional gains and to limit LE progression.    Baseline  dependent  08/25/2017: partially met- met for wraps only    Time  12    Period  Weeks    Status  Partially Met      OT LONG TERM GOAL #5   Title  Lymphedema (LE) management/ self-care:  During Management Phase CDT Pt to sustain current limb volumes within 3%, and all other clinical gains achieved during OT treatment independently (to control limb swelling and associated pain, to  limit LE progression, to decrease infection risk and to limit further functional decline.    Baseline  dependent    Time  6    Period  Months    Status  On-going            Plan - 10/13/17 1706    Clinical Impression Statement  LLE limb volume appears well managed again today. Pt and CG education provided re  compression garment options and recommendations. We also discussed  process with DME vendor and insurance benefits throughout session. Completed  anatomical measurements for sustom , knee length, ccl 3, Elvarex, flat knit ( 35-45 mmHg)  compression garments and knee length, Elvarex  RELAX   devices fo limit fibrosis formation during HOS. OT praised Pt for starting PT today as ther ex to improve active muscle contraction will hopefully improve more active muscle pumping for lympatic decongestion. PReinterated to Pt and CG that Pt would benefit from consult  with her PCP to explore etiology  of  SOB in supine and excessive systemic fluid retention.  Cont as per POC. Order LLE garments from vendor tomorrow.    Occupational Profile and client history currently impacting functional performance  Generalized weekness limits Pt's unctional mobility and ambulation./ She will benefit from Physical Therapy to address these deficits once LE is complete.    Occupational performance deficits (Please refer to evaluation for details):  ADL's;IADL's;Work;Leisure;Social Participation    Rehab Potential  Fair    Current Impairments/barriers affecting progress:  Pt prognosis without max caregiver assistance with LE self care home program between OT visits is poor. Caregiver attendance, participation in therapy sessions, and daily assistance with LE self care going forward is essential for retaining clinical gains over time.    OT Frequency  3x / week    OT Treatment/Interventions  Self-care/ADL training;Manual lymph drainage;DME and/or AE instruction;Compression bandaging;Patient/family education;Therapeutic exercise;Manual Therapy;Therapeutic activities       Patient will benefit from skilled therapeutic intervention in order to improve the following deficits and impairments:  Abnormal gait, Decreased skin integrity, Decreased knowledge of precautions, Decreased scar mobility, Impaired perceived functional ability, Decreased activity tolerance, Decreased knowledge of use of DME, Decreased strength, Impaired flexibility, Decreased balance, Decreased mobility, Difficulty walking, Obesity, Decreased cognition, Decreased range of motion, Increased edema,  Pain  Visit Diagnosis: Lymphedema, not elsewhere classified    Problem List Patient Active Problem List   Diagnosis Date Noted  . Acute respiratory failure ([place]) 12/07/2016  . Spontaneous pneumothorax 12/05/2016  . Acute respiratory failure with hypoxia ([place]) 08/07/2016  . Influenza 08/05/2016  . Dyspnea 08/05/2016  . Constipation 08/05/2016  . Back pain 08/05/2016  . Generalized weakness 08/05/2016  . Intractable back pain 08/01/2016    Andrey Spearman, MS, OTR/L, [facility] 10/13/17 5:15 PM   [place] MAIN [facility] SERVICES [address] [place], [place], [address] Phone: [phone]   Fax:  [phone]  Name: ANIAH PAULI MRN: [id number] Date of Birth: August 19, 1929

## 2017-10-15 ENCOUNTER — Ambulatory Visit: Payer: Medicare Other | Admitting: Occupational Therapy

## 2017-10-15 DIAGNOSIS — I890 Lymphedema, not elsewhere classified: Secondary | ICD-10-CM

## 2017-10-15 NOTE — Therapy (Signed)
[place] MAIN [facility] SERVICES [address] [place], [place], [address] Phone: [phone]   Fax:  [phone]  Occupational Therapy Treatment  Patient Details  Name: [name] MRN: [id number] Date of Birth: Nov 05, 1929 Referring Provider: Woodroe Chen, PA-C   Encounter Date: 10/15/2017  OT End of Session - 10/15/17 1458    Visit Number  16    Number of Visits  36    Date for OT Re-Evaluation  11/10/17    OT Start Time  0200    OT Stop Time  0256    OT Time Calculation (min)  56 min    Activity Tolerance  Patient tolerated treatment well;Patient limited by pain;Other (comment) BLE weakness and poor functional mobility    Behavior During Therapy  WFL for tasks assessed/performed       Past Medical History:  Diagnosis Date  . COPD (chronic obstructive pulmonary disease) ([place])   . DVT (deep venous thrombosis) ([place])   . Hypertension   . Pneumothorax     Past Surgical History:  Procedure Laterality Date  . ABDOMINAL HYSTERECTOMY    . colon cancer    . HEMIARTHROPLASTY SHOULDER FRACTURE Left   . TIBIA FRACTURE SURGERY Left     There were no vitals filed for this visit.  Subjective Assessment - 10/15/17 1459    Subjective   Pt presents for OT visit 16/36 to address BLE lymphedema (LE) Pt is accompainied by private caregiver, [name] , today. She presents with thigh high wraps in place on LE only. Pt reports she has first PT rx appt after this session.    Pertinent History  Colon cx; hx of DVT, s/p 2012 L tibia fx; hyperlipiemia;; former smoker; hx L lateral calf venous stasis ulcer    Limitations  difficulty walking, impaired functional mobility and transfers, chronic BLE leg swelling and pain; impaired basic ADLs- LB bathing , dressing, and fitting LB clothing and street shoes, difficulty lifting legs to elevate and comple transfers, impaired IADLs- requires assistance for all productive tasks and home management activities. Decreased  social participation- homebound without assistance from family    Patient Stated Goals  get swelling down and keep it from getting worse    Currently in Pain?  No/denies                   OT Treatments/Exercises (OP) - 10/15/17 0001      ADLs   ADL Education Given  Yes      Manual Therapy   Manual Therapy  Edema management;Compression Bandaging;Manual Lymphatic Drainage (MLD)    Manual therapy comments  skin care to LLE throughout MLD.    Manual Lymphatic Drainage (MLD)  MLD to LLE utilizing "short neck: sequence, deep abdominal lymphatics and functional inguinal LN.    Compression Bandaging  LLE gradient compression wrap from toes to thigh as established             OT Education - 10/15/17 1501    Education provided  Yes    Education Details  Commenced Pt edu for simple self MLD w/ Pt today./ By end of session she was able to perform short neck sequence using J stroke w/ Max A    Person(s) Educated  Patient;Caregiver(s)    Methods  Explanation;Demonstration;Tactile cues;Verbal cues    Comprehension  Verbalized understanding;Returned demonstration;Need further instruction;Verbal cues required;Tactile cues required          OT Long Term Goals - 09/22/17 1625  OT LONG TERM GOAL #1   Title  Pt mod A w/ lymphedema precautions and prevention principals and strategies with caregiver assistance using printed reference to limit LE progression and infection risk.    Baseline  dependent 08/25/2017: Goal met for family caregiver- Son.    Time  2    Period  Weeks    Status  Achieved      OT LONG TERM GOAL #2   Title  Lymphedema (LE) management/ self-care: Pt able to apply thigh length, multi layered, gradient compression wraps with Maximum caregiver assistance  using correct  techniques within 2 weeks to achieve optimal limb volume reductions in preparation for fitting compression garments/ devices bilaterally.    Baseline  Dependent 08/25/2017: Goal Met    Time  2     Period  Weeks    Status  Achieved      OT LONG TERM GOAL #3   Title  Lymphedema (LE) management/ self-care:  Pt to achieve at least 10%  BLE limb volume reductions  during Intensive Phase CDT to limit LE progression, to reduce pain, to improve ADLs performance.    Baseline  dependent  08/25/2017: Partially met for LLE. RLE rx not started yet    Time  12    Period  Weeks    Status  Partially Met      OT LONG TERM GOAL #4   Title  Lymphedema (LE) management/ self-care:  Pt to tolerate daily compression wraps, compression garments and/ or HOS devices in keeping w/ prescribed wear regime within 1 week of issue date of each to progress and retain clinical and functional gains and to limit LE progression.    Baseline  dependent  08/25/2017: partially met- met for wraps only    Time  12    Period  Weeks    Status  Partially Met      OT LONG TERM GOAL #5   Title  Lymphedema (LE) management/ self-care:  During Management Phase CDT Pt to sustain current limb volumes within 3%, and all other clinical gains achieved during OT treatment independently (to control limb swelling and associated pain, to  limit LE progression, to decrease infection risk and to limit further functional decline.    Baseline  dependent    Time  6    Period  Months    Status  On-going            Plan - 10/15/17 1502    Clinical Impression Statement  Pt able to perform short neck opening MLD sequecnce using J stroke w/ Max assist after skilled instruction . Pt tolerated LLLE MLD w/ report of mild lateral thigh "cramps" w/ active assist knee and hip flexion in supine . Applied compression wraps and provided Max A x 2 for transfer to wc. Cont as per POC.    Occupational Profile and client history currently impacting functional performance  Generalized weekness limits Pt's unctional mobility and ambulation./ She will benefit from Physical Therapy to address these deficits once LE is complete.    Occupational performance  deficits (Please refer to evaluation for details):  ADL's;IADL's;Work;Leisure;Social Participation    Rehab Potential  Fair    Current Impairments/barriers affecting progress:  Pt prognosis without max caregiver assistance with LE self care home program between OT visits is poor. Caregiver attendance, participation in therapy sessions, and daily assistance with LE self care going forward is essential for retaining clinical gains over time.    OT Frequency  3x /  week    OT Treatment/Interventions  Self-care/ADL training;Manual lymph drainage;DME and/or AE instruction;Compression bandaging;Patient/family education;Therapeutic exercise;Manual Therapy;Therapeutic activities       Patient will benefit from skilled therapeutic intervention in order to improve the following deficits and impairments:  Abnormal gait, Decreased skin integrity, Decreased knowledge of precautions, Decreased scar mobility, Impaired perceived functional ability, Decreased activity tolerance, Decreased knowledge of use of DME, Decreased strength, Impaired flexibility, Decreased balance, Decreased mobility, Difficulty walking, Obesity, Decreased cognition, Decreased range of motion, Increased edema, Pain  Visit Diagnosis: Lymphedema, not elsewhere classified    Problem List Patient Active Problem List   Diagnosis Date Noted  . Acute respiratory failure ([place]) 12/07/2016  . Spontaneous pneumothorax 12/05/2016  . Acute respiratory failure with hypoxia ([place]) 08/07/2016  . Influenza 08/05/2016  . Dyspnea 08/05/2016  . Constipation 08/05/2016  . Back pain 08/05/2016  . Generalized weakness 08/05/2016  . Intractable back pain 08/01/2016    Andrey Spearman, MS, OTR/L, [facility] 10/15/17 3:04 PM  [place] MAIN [facility] SERVICES [address] [place], [place], [address] Phone: [phone]   Fax:  [phone]  Name: KELIYAH SPILLMAN MRN: [id number] Date of Birth: March 21, 1930

## 2017-10-19 ENCOUNTER — Ambulatory Visit: Payer: Medicare Other | Admitting: Occupational Therapy

## 2017-10-19 DIAGNOSIS — I890 Lymphedema, not elsewhere classified: Secondary | ICD-10-CM

## 2017-10-19 NOTE — Therapy (Signed)
[place] MAIN [facility] SERVICES [address] [place], [place], [address] Phone: [phone]   Fax:  [phone]  Occupational Therapy Treatment  Patient Details  Name: [name] MRN: [id number] Date of Birth: 1929/10/21 Referring Provider: Woodroe Chen, PA-C   Encounter Date: 10/19/2017  OT End of Session - 10/19/17 1529    Visit Number  17    Number of Visits  36    Date for OT Re-Evaluation  11/10/17    OT Start Time  0210    OT Stop Time  0321    OT Time Calculation (min)  71 min    Activity Tolerance  Patient tolerated treatment well;Patient limited by pain;Other (comment) BLE weakness and poor functional mobility    Behavior During Therapy  WFL for tasks assessed/performed       Past Medical History:  Diagnosis Date  . COPD (chronic obstructive pulmonary disease) ([place])   . DVT (deep venous thrombosis) ([place])   . Hypertension   . Pneumothorax     Past Surgical History:  Procedure Laterality Date  . ABDOMINAL HYSTERECTOMY    . colon cancer    . HEMIARTHROPLASTY SHOULDER FRACTURE Left   . TIBIA FRACTURE SURGERY Left     There were no vitals filed for this visit.  Subjective Assessment - 10/19/17 1525    Subjective   Pt presents for OT visit 17//36 to address BLE lymphedema (LE) Pt is accompainied by private caregiver. She presents with thigh high wraps in place on LLE. Pt reports she plans to see PT 2 x weekly.    Pertinent History  Colon cx; hx of DVT, s/p 2012 L tibia fx; hyperlipiemia;; former smoker; hx L lateral calf venous stasis ulcer    Limitations  difficulty walking, impaired functional mobility and transfers, chronic BLE leg swelling and pain; impaired basic ADLs- LB bathing , dressing, and fitting LB clothing and street shoes, difficulty lifting legs to elevate and comple transfers, impaired IADLs- requires assistance for all productive tasks and home management activities. Decreased social participation- homebound  without assistance from family    Patient Stated Goals  get swelling down and keep it from getting worse    Currently in Pain?  No/denies    Pain Location  Knee    Pain Orientation  Left    Pain Descriptors / Indicators  Sharp    Pain Type  Acute pain    Pain Onset  Today                   OT Treatments/Exercises (OP) - 10/19/17 0001      ADLs   ADL Education Given  Yes      Manual Therapy   Manual Therapy  Edema management;Manual Lymphatic Drainage (MLD);Compression Bandaging    Manual therapy comments  skin care to LLE throughout MLD.    Manual Lymphatic Drainage (MLD)  MLD to LLE utilizing "short neck: sequence, deep abdominal lymphatics and functional inguinal LN.    Compression Bandaging  LLE gradient compression wrap from toes to thigh as established             OT Education - 10/19/17 1528    Education provided  Yes    Education Details  CG instructed re safety and ergonomics for lifting and transfers. Cont Pt edu for LE self care and progress towards goals.    Person(s) Educated  Patient;Caregiver(s)    Methods  Explanation;Demonstration    Comprehension  Verbalized understanding;Returned  demonstration          OT Long Term Goals - 09/22/17 1625      OT LONG TERM GOAL #1   Title  Pt mod A w/ lymphedema precautions and prevention principals and strategies with caregiver assistance using printed reference to limit LE progression and infection risk.    Baseline  dependent 08/25/2017: Goal met for family caregiver- Son.    Time  2    Period  Weeks    Status  Achieved      OT LONG TERM GOAL #2   Title  Lymphedema (LE) management/ self-care: Pt able to apply thigh length, multi layered, gradient compression wraps with Maximum caregiver assistance  using correct  techniques within 2 weeks to achieve optimal limb volume reductions in preparation for fitting compression garments/ devices bilaterally.    Baseline  Dependent 08/25/2017: Goal Met    Time  2     Period  Weeks    Status  Achieved      OT LONG TERM GOAL #3   Title  Lymphedema (LE) management/ self-care:  Pt to achieve at least 10%  BLE limb volume reductions  during Intensive Phase CDT to limit LE progression, to reduce pain, to improve ADLs performance.    Baseline  dependent  08/25/2017: Partially met for LLE. RLE rx not started yet    Time  12    Period  Weeks    Status  Partially Met      OT LONG TERM GOAL #4   Title  Lymphedema (LE) management/ self-care:  Pt to tolerate daily compression wraps, compression garments and/ or HOS devices in keeping w/ prescribed wear regime within 1 week of issue date of each to progress and retain clinical and functional gains and to limit LE progression.    Baseline  dependent  08/25/2017: partially met- met for wraps only    Time  12    Period  Weeks    Status  Partially Met      OT LONG TERM GOAL #5   Title  Lymphedema (LE) management/ self-care:  During Management Phase CDT Pt to sustain current limb volumes within 3%, and all other clinical gains achieved during OT treatment independently (to control limb swelling and associated pain, to  limit LE progression, to decrease infection risk and to limit further functional decline.    Baseline  dependent    Time  6    Period  Months    Status  On-going            Plan - 10/19/17 1530    Clinical Impression Statement  Pt tolerated manual therapy without difficulty today, including skin care, MLD and compression wrapping. LLE appears mildly more swollen distally again todat. RLE is visibly more swollen with shiney,  pebbley  texture. L6ymphorrhea is absent. DME vendor emaled to let me know that custom LLE compression garments have been ordered . Plan to complete LLE fitting ASAP and commence CDT to RLE.    Occupational Profile and client history currently impacting functional performance  Generalized weekness limits Pt's unctional mobility and ambulation./ She will benefit from Physical  Therapy to address these deficits once LE is complete.    Occupational performance deficits (Please refer to evaluation for details):  ADL's;IADL's;Work;Leisure;Social Participation    Rehab Potential  Fair    Current Impairments/barriers affecting progress:  Pt prognosis without max caregiver assistance with LE self care home program between OT visits is poor. Caregiver attendance, participation  in therapy sessions, and daily assistance with LE self care going forward is essential for retaining clinical gains over time.    OT Frequency  3x / week    OT Treatment/Interventions  Self-care/ADL training;Manual lymph drainage;DME and/or AE instruction;Compression bandaging;Patient/family education;Therapeutic exercise;Manual Therapy;Therapeutic activities       Patient will benefit from skilled therapeutic intervention in order to improve the following deficits and impairments:  Abnormal gait, Decreased skin integrity, Decreased knowledge of precautions, Decreased scar mobility, Impaired perceived functional ability, Decreased activity tolerance, Decreased knowledge of use of DME, Decreased strength, Impaired flexibility, Decreased balance, Decreased mobility, Difficulty walking, Obesity, Decreased cognition, Decreased range of motion, Increased edema, Pain  Visit Diagnosis: Lymphedema, not elsewhere classified    Problem List Patient Active Problem List   Diagnosis Date Noted  . Acute respiratory failure ([place]) 12/07/2016  . Spontaneous pneumothorax 12/05/2016  . Acute respiratory failure with hypoxia ([place]) 08/07/2016  . Influenza 08/05/2016  . Dyspnea 08/05/2016  . Constipation 08/05/2016  . Back pain 08/05/2016  . Generalized weakness 08/05/2016  . Intractable back pain 08/01/2016    Andrey Spearman, MS, OTR/L, [facility] 10/19/17 3:34 PM   [place] MAIN [facility] SERVICES [address] [place], [place], [address] Phone: [phone]   Fax:   [phone]  Name: [name] MRN: [id number] Date of Birth: July 30, 1929

## 2017-10-20 ENCOUNTER — Ambulatory Visit: Payer: Medicare Other | Admitting: Occupational Therapy

## 2017-10-20 DIAGNOSIS — I890 Lymphedema, not elsewhere classified: Secondary | ICD-10-CM

## 2017-10-21 NOTE — Therapy (Signed)
[place] MAIN [facility] SERVICES [address] [place], [place], [address] Phone: [phone]   Fax:  [phone]  Occupational Therapy Treatment  Patient Details  Name: [name] MRN: [id number] Date of Birth: 1930-05-29 Referring Provider: Woodroe Chen, PA-C   Encounter Date: 10/20/2017  OT End of Session - 10/21/17 0938    Visit Number  18    Number of Visits  36    Date for OT Re-Evaluation  11/10/17    OT Start Time  0205    OT Stop Time  0308    OT Time Calculation (min)  63 min    Activity Tolerance  Patient tolerated treatment well;Patient limited by pain;Other (comment) BLE weakness and poor functional mobility    Behavior During Therapy  WFL for tasks assessed/performed       Past Medical History:  Diagnosis Date  . COPD (chronic obstructive pulmonary disease) ([place])   . DVT (deep venous thrombosis) ([place])   . Hypertension   . Pneumothorax     Past Surgical History:  Procedure Laterality Date  . ABDOMINAL HYSTERECTOMY    . colon cancer    . HEMIARTHROPLASTY SHOULDER FRACTURE Left   . TIBIA FRACTURE SURGERY Left     There were no vitals filed for this visit.  Subjective Assessment - 10/20/17 0935    Subjective   Pt presents for OT visit 17//36 to address BLE lymphedema (LE) Pt is accompainied by private caregiver. She presents with thigh high wraps in place on LLE. Pt reports she plans to see PT 2 x weekly.    Pertinent History  Colon cx; hx of DVT, s/p 2012 L tibia fx; hyperlipiemia;; former smoker; hx L lateral calf venous stasis ulcer    Limitations  difficulty walking, impaired functional mobility and transfers, chronic BLE leg swelling and pain; impaired basic ADLs- LB bathing , dressing, and fitting LB clothing and street shoes, difficulty lifting legs to elevate and comple transfers, impaired IADLs- requires assistance for all productive tasks and home management activities. Decreased social participation- homebound  without assistance from family    Patient Stated Goals  get swelling down and keep it from getting worse    Currently in Pain?  Yes    Pain Location  -- medial L thigh- not numerically rated    Pain Descriptors / Indicators  Sore;Tender with palpation    Pain Type  Chronic pain;Acute pain    Pain Onset  Other (comment) intermittant w/ palpation                   OT Treatments/Exercises (OP) - 10/21/17 0001      ADLs   ADL Education Given  Yes      Manual Therapy   Manual Therapy  Edema management;Manual Lymphatic Drainage (MLD);Compression Bandaging    Manual therapy comments  skin care to LLE throughout MLD.    Manual Lymphatic Drainage (MLD)  MLD to LLE utilizing "short neck: sequence, deep abdominal lymphatics and functional inguinal LN.    Compression Bandaging  LLE gradient compression wrap from toes to thigh as established             OT Education - 10/20/17 0937    Education provided  Yes    Education Details  Continued skilled Pt/caregiver education  And LE ADL training throughout visit for lymphedema self care/ home program, including compression wrapping, compression garment and device wear/care, lymphatic pumping ther ex, simple self-MLD, and skin care. Discussed  progress towards goals.     Person(s) Educated  Patient;Caregiver(s)    Methods  Explanation;Demonstration    Comprehension  Verbalized understanding;Returned demonstration;Need further instruction          OT Long Term Goals - 09/22/17 1625      OT LONG TERM GOAL #1   Title  Pt mod A w/ lymphedema precautions and prevention principals and strategies with caregiver assistance using printed reference to limit LE progression and infection risk.    Baseline  dependent 08/25/2017: Goal met for family caregiver- Son.    Time  2    Period  Weeks    Status  Achieved      OT LONG TERM GOAL #2   Title  Lymphedema (LE) management/ self-care: Pt able to apply thigh length, multi layered, gradient  compression wraps with Maximum caregiver assistance  using correct  techniques within 2 weeks to achieve optimal limb volume reductions in preparation for fitting compression garments/ devices bilaterally.    Baseline  Dependent 08/25/2017: Goal Met    Time  2    Period  Weeks    Status  Achieved      OT LONG TERM GOAL #3   Title  Lymphedema (LE) management/ self-care:  Pt to achieve at least 10%  BLE limb volume reductions  during Intensive Phase CDT to limit LE progression, to reduce pain, to improve ADLs performance.    Baseline  dependent  08/25/2017: Partially met for LLE. RLE rx not started yet    Time  12    Period  Weeks    Status  Partially Met      OT LONG TERM GOAL #4   Title  Lymphedema (LE) management/ self-care:  Pt to tolerate daily compression wraps, compression garments and/ or HOS devices in keeping w/ prescribed wear regime within 1 week of issue date of each to progress and retain clinical and functional gains and to limit LE progression.    Baseline  dependent  08/25/2017: partially met- met for wraps only    Time  12    Period  Weeks    Status  Partially Met      OT LONG TERM GOAL #5   Title  Lymphedema (LE) management/ self-care:  During Management Phase CDT Pt to sustain current limb volumes within 3%, and all other clinical gains achieved during OT treatment independently (to control limb swelling and associated pain, to  limit LE progression, to decrease infection risk and to limit further functional decline.    Baseline  dependent    Time  6    Period  Months    Status  On-going            Plan - 10/21/17 0939    Clinical Impression Statement  Pt reports fatigue after PT session earlier today. Pt tolerated MLD to LLE without difficulty. RLE continues to present with exacerbation of what appears to be systemic swelling. RLE appears to be on the verge of lymphorrhea first observed last week.. OT has encouraged Pt to see her PCP to consult about worsening fluid  retention , and OT has sent note home to family members with same recommenda Pt remains unable to lay supine and iseverely SOB w/ HOB below 30 degrees. Pt tolerated LLE MLD without difficulty.  A-G compression wraps reapplied as established. Awaiting custom RLE, knee length garment fitting. Stocking has been ordered per DME vendor. Cont as per POC. Cont to carefully monitor SOB during manual therapies and  use caution w/ cmopression.    Occupational Profile and client history currently impacting functional performance  Generalized weekness limits Pt's unctional mobility and ambulation./ She will benefit from Physical Therapy to address these deficits once LE is complete.    Occupational performance deficits (Please refer to evaluation for details):  ADL's;IADL's;Work;Leisure;Social Participation    Rehab Potential  Fair    Current Impairments/barriers affecting progress:  Pt prognosis without max caregiver assistance with LE self care home program between OT visits is poor. Caregiver attendance, participation in therapy sessions, and daily assistance with LE self care going forward is essential for retaining clinical gains over time.    OT Frequency  3x / week    OT Treatment/Interventions  Self-care/ADL training;Manual lymph drainage;DME and/or AE instruction;Compression bandaging;Patient/family education;Therapeutic exercise;Manual Therapy;Therapeutic activities       Patient will benefit from skilled therapeutic intervention in order to improve the following deficits and impairments:  Abnormal gait, Decreased skin integrity, Decreased knowledge of precautions, Decreased scar mobility, Impaired perceived functional ability, Decreased activity tolerance, Decreased knowledge of use of DME, Decreased strength, Impaired flexibility, Decreased balance, Decreased mobility, Difficulty walking, Obesity, Decreased cognition, Decreased range of motion, Increased edema, Pain  Visit Diagnosis: Lymphedema, not  elsewhere classified    Problem List Patient Active Problem List   Diagnosis Date Noted  . Acute respiratory failure ([place]) 12/07/2016  . Spontaneous pneumothorax 12/05/2016  . Acute respiratory failure with hypoxia ([place]) 08/07/2016  . Influenza 08/05/2016  . Dyspnea 08/05/2016  . Constipation 08/05/2016  . Back pain 08/05/2016  . Generalized weakness 08/05/2016  . Intractable back pain 08/01/2016    Andrey Spearman, MS, OTR/L, [facility] 10/21/17 9:46 AM   [place] MAIN [facility] SERVICES [address] [place], [place], [address] Phone: [phone]   Fax:  [phone]  Name: [name] MRN: [id number] Date of Birth: 02-Jun-1930

## 2017-10-22 ENCOUNTER — Ambulatory Visit: Payer: Medicare Other | Admitting: Occupational Therapy

## 2017-10-22 DIAGNOSIS — I890 Lymphedema, not elsewhere classified: Secondary | ICD-10-CM | POA: Diagnosis not present

## 2017-10-22 NOTE — Therapy (Signed)
[place] MAIN [facility] SERVICES [address] [place], [place], [address] Phone: [phone]   Fax:  [phone]  Occupational Therapy Treatment  Patient Details  Name: [name] MRN: [id number] Date of Birth: [birth year] Referring Provider: Woodroe Chen, PA-C   Encounter Date: 10/22/2017  OT End of Session - 10/22/17 1614    Visit Number  19    Number of Visits  36    Date for OT Re-Evaluation  11/10/17    OT Start Time  0205    OT Stop Time  0302    OT Time Calculation (min)  57 min    Activity Tolerance  Patient tolerated treatment well;Patient limited by pain;Other (comment) BLE weakness and poor functional mobility    Behavior During Therapy  WFL for tasks assessed/performed       Past Medical History:  Diagnosis Date  . COPD (chronic obstructive pulmonary disease) ([place])   . DVT (deep venous thrombosis) ([place])   . Hypertension   . Pneumothorax     Past Surgical History:  Procedure Laterality Date  . ABDOMINAL HYSTERECTOMY    . colon cancer    . HEMIARTHROPLASTY SHOULDER FRACTURE Left   . TIBIA FRACTURE SURGERY Left     There were no vitals filed for this visit.  Subjective Assessment - 10/22/17 1615    Subjective   Pt presents for OT visit 19//36 to address BLE lymphedema (LE) Pt is accompainied by private caregiver. She presents with thigh high wraps in place on LLE. Pt reports she plans to see PT 2 x weekly.    Pertinent History  Colon cx; hx of DVT, s/p 2012 L tibia fx; hyperlipiemia;; former smoker; hx L lateral calf venous stasis ulcer    Limitations  difficulty walking, impaired functional mobility and transfers, chronic BLE leg swelling and pain; impaired basic ADLs- LB bathing , dressing, and fitting LB clothing and street shoes, difficulty lifting legs to elevate and comple transfers, impaired IADLs- requires assistance for all productive tasks and home management activities. Decreased social participation- homebound  without assistance from family    Patient Stated Goals  get swelling down and keep it from getting worse    Pain Onset  Other (comment) intermittant w/ palpation                   OT Treatments/Exercises (OP) - 10/22/17 0001      ADLs   ADL Education Given  Yes      Manual Therapy   Manual Therapy  Edema management;Manual Lymphatic Drainage (MLD);Compression Bandaging    Manual therapy comments  skin care to LLE throughout MLD.    Manual Lymphatic Drainage (MLD)  MLD to LLE utilizing "short neck: sequence, deep abdominal lymphatics and functional inguinal LN.             OT Education - 10/22/17 1613    Education provided  Yes    Education Details  Continued skilled Pt/caregiver education  And LE ADL training throughout visit for lymphedema self care/ home program, including compression wrapping, compression garment and device wear/care, lymphatic pumping ther ex, simple self-MLD, and skin care. Discussed progress towards goals.     Person(s) Educated  Patient;Caregiver(s)    Methods  Explanation;Demonstration    Comprehension  Verbalized understanding;Returned demonstration;Need further instruction          OT Long Term Goals - 09/22/17 1625      OT LONG TERM GOAL #1   Title  Pt mod A w/ lymphedema precautions and prevention principals and strategies with caregiver assistance using printed reference to limit LE progression and infection risk.    Baseline  dependent 08/25/2017: Goal met for family caregiver- Son.    Time  2    Period  Weeks    Status  Achieved      OT LONG TERM GOAL #2   Title  Lymphedema (LE) management/ self-care: Pt able to apply thigh length, multi layered, gradient compression wraps with Maximum caregiver assistance  using correct  techniques within 2 weeks to achieve optimal limb volume reductions in preparation for fitting compression garments/ devices bilaterally.    Baseline  Dependent 08/25/2017: Goal Met    Time  2    Period  Weeks     Status  Achieved      OT LONG TERM GOAL #3   Title  Lymphedema (LE) management/ self-care:  Pt to achieve at least 10%  BLE limb volume reductions  during Intensive Phase CDT to limit LE progression, to reduce pain, to improve ADLs performance.    Baseline  dependent  08/25/2017: Partially met for LLE. RLE rx not started yet    Time  12    Period  Weeks    Status  Partially Met      OT LONG TERM GOAL #4   Title  Lymphedema (LE) management/ self-care:  Pt to tolerate daily compression wraps, compression garments and/ or HOS devices in keeping w/ prescribed wear regime within 1 week of issue date of each to progress and retain clinical and functional gains and to limit LE progression.    Baseline  dependent  08/25/2017: partially met- met for wraps only    Time  12    Period  Weeks    Status  Partially Met      OT LONG TERM GOAL #5   Title  Lymphedema (LE) management/ self-care:  During Management Phase CDT Pt to sustain current limb volumes within 3%, and all other clinical gains achieved during OT treatment independently (to control limb swelling and associated pain, to  limit LE progression, to decrease infection risk and to limit further functional decline.    Baseline  dependent    Time  6    Period  Months    Status  On-going            Plan - 10/22/17 1615    Clinical Impression Statement  Pt demonstrates increased active movement in RLE at knee, ankle, and toes today, which is expected to improve lymphatic muscle pumping  function significantly. Pt reports she is exercising in her chair at home. Movement in LLE continues to improve over time with  knee flexion increased  from 0 to ~50 degrees  today. LLE is less heavy and skin condition continues to improve . Pt tolerated MLD, skin care and compression wrapping without difficulty today. Cont as per POC.    Occupational Profile and client history currently impacting functional performance  Generalized weekness limits Pt's  unctional mobility and ambulation./ She will benefit from Physical Therapy to address these deficits once LE is complete.    Occupational performance deficits (Please refer to evaluation for details):  ADL's;IADL's;Work;Leisure;Social Participation    Rehab Potential  Fair    Current Impairments/barriers affecting progress:  Pt prognosis without max caregiver assistance with LE self care home program between OT visits is poor. Caregiver attendance, participation in therapy sessions, and daily assistance with LE self care going forward is  essential for retaining clinical gains over time.    OT Frequency  3x / week    OT Treatment/Interventions  Self-care/ADL training;Manual lymph drainage;DME and/or AE instruction;Compression bandaging;Patient/family education;Therapeutic exercise;Manual Therapy;Therapeutic activities       Patient will benefit from skilled therapeutic intervention in order to improve the following deficits and impairments:  Abnormal gait, Decreased skin integrity, Decreased knowledge of precautions, Decreased scar mobility, Impaired perceived functional ability, Decreased activity tolerance, Decreased knowledge of use of DME, Decreased strength, Impaired flexibility, Decreased balance, Decreased mobility, Difficulty walking, Obesity, Decreased cognition, Decreased range of motion, Increased edema, Pain  Visit Diagnosis: Lymphedema, not elsewhere classified    Problem List Patient Active Problem List   Diagnosis Date Noted  . Acute respiratory failure ([place]) 12/07/2016  . Spontaneous pneumothorax 12/05/2016  . Acute respiratory failure with hypoxia ([place]) 08/07/2016  . Influenza 08/05/2016  . Dyspnea 08/05/2016  . Constipation 08/05/2016  . Back pain 08/05/2016  . Generalized weakness 08/05/2016  . Intractable back pain 08/01/2016    Andrey Spearman, MS, OTR/L, [facility] 10/22/17 4:20 PM   [place] MAIN [facility] SERVICES [address] [place], [place], [address] Phone: [phone]   Fax:  [phone]  Name: LARKEN URIAS MRN: [id number] Date of Birth: [birth year]

## 2017-10-26 ENCOUNTER — Ambulatory Visit: Payer: Medicare Other | Admitting: Occupational Therapy

## 2017-10-27 ENCOUNTER — Encounter: Payer: Medicare Other | Admitting: Occupational Therapy

## 2017-10-28 ENCOUNTER — Ambulatory Visit: Payer: Medicare Other | Admitting: Occupational Therapy

## 2017-10-29 ENCOUNTER — Ambulatory Visit: Payer: Medicare Other | Admitting: Occupational Therapy

## 2017-10-29 ENCOUNTER — Encounter: Payer: Medicare Other | Admitting: Occupational Therapy

## 2017-10-29 DIAGNOSIS — I890 Lymphedema, not elsewhere classified: Secondary | ICD-10-CM | POA: Diagnosis not present

## 2017-10-29 NOTE — Therapy (Signed)
[place] MAIN [facility] SERVICES [address] [place], [place], [address] Phone: [phone]   Fax:  [phone]  Occupational Therapy Treatment  Patient Details  Name: [name] MRN: [id number] Date of Birth: 06/09/1930 Referring Provider: Woodroe Chen, PA-C   Encounter Date: 10/29/2017  OT End of Session - 10/29/17 1646    Visit Number  20    Number of Visits  36    Date for OT Re-Evaluation  11/10/17    OT Start Time  1001    OT Stop Time  1016    OT Time Calculation (min)  15 min    Activity Tolerance  Patient tolerated treatment well;Patient limited by pain;Other (comment) BLE weakness and poor functional mobility    Behavior During Therapy  WFL for tasks assessed/performed       Past Medical History:  Diagnosis Date  . COPD (chronic obstructive pulmonary disease) ([place])   . DVT (deep venous thrombosis) ([place])   . Hypertension   . Pneumothorax     Past Surgical History:  Procedure Laterality Date  . ABDOMINAL HYSTERECTOMY    . colon cancer    . HEMIARTHROPLASTY SHOULDER FRACTURE Left   . TIBIA FRACTURE SURGERY Left     There were no vitals filed for this visit.  Subjective Assessment - 10/29/17 1642    Subjective   Pt presents for OT visit 20//36 to address BLE lymphedema (LE) Pt is accompainied by private caregiver. Pt presents without compression wraps in place. LLE below the knee is very reddened, warm and swollen. Caregiver reports that Pt instructed caregivers to remove wraps 5 days ago due to discomfort in the leg and would not let them reapply.    Pertinent History  Colon cx; hx of DVT, s/p 2012 L tibia fx; hyperlipiemia;; former smoker; hx L lateral calf venous stasis ulcer    Limitations  difficulty walking, impaired functional mobility and transfers, chronic BLE leg swelling and pain; impaired basic ADLs- LB bathing , dressing, and fitting LB clothing and street shoes, difficulty lifting legs to elevate and comple  transfers, impaired IADLs- requires assistance for all productive tasks and home management activities. Decreased social participation- homebound without assistance from family    Patient Stated Goals  get swelling down and keep it from getting worse    Currently in Pain?  Yes not rated numerically    Pain Location  Leg    Pain Orientation  Left    Pain Descriptors / Indicators  Tightness;Heaviness;Discomfort;Pressure    Pain Type  Acute pain;Chronic pain    Pain Onset  In the past 7 days intermittant w/ palpation    Pain Frequency  Constant    Aggravating Factors   dependent positioning, compression wraps by report                   OT Treatments/Exercises (OP) - 10/29/17 0001      ADLs   ADL Education Given  Yes             OT Education - 10/29/17 1645    Education provided  Yes    Education Details  Reviewed signs and symptoms of cellulitis infection and treatment precautions.    Person(s) Educated  Patient;Caregiver(s)    Methods  Explanation;Demonstration    Comprehension  Verbalized understanding;Returned demonstration;Tactile cues required;Need further instruction          OT Long Term Goals - 10/29/17 1653      OT LONG  TERM GOAL #1   Title  Pt mod A w/ lymphedema precautions and prevention principals and strategies with caregiver assistance using printed reference to limit LE progression and infection risk.    Baseline  dependent 08/25/2017: Goal met for family caregiver- Son.    Time  2    Period  Weeks    Status  Not Met      OT LONG TERM GOAL #2   Title  Lymphedema (LE) management/ self-care: Pt able to apply thigh length, multi layered, gradient compression wraps with Maximum caregiver assistance  using correct  techniques within 2 weeks to achieve optimal limb volume reductions in preparation for fitting compression garments/ devices bilaterally.    Baseline  Dependent 08/25/2017: Goal Met    Time  2    Period  Weeks    Status  Achieved       OT LONG TERM GOAL #3   Title  Lymphedema (LE) management/ self-care:  Pt to achieve at least 10%  BLE limb volume reductions  during Intensive Phase CDT to limit LE progression, to reduce pain, to improve ADLs performance.    Baseline  dependent  08/25/2017: Partially met for LLE. RLE rx not started yet    Time  12    Period  Weeks    Status  Partially Met      OT LONG TERM GOAL #4   Title  Lymphedema (LE) management/ self-care:  Pt to tolerate daily compression wraps, compression garments and/ or HOS devices in keeping w/ prescribed wear regime within 1 week of issue date of each to progress and retain clinical and functional gains and to limit LE progression.    Baseline  dependent  08/25/2017: partially met- met for wraps only    Time  12    Period  Weeks    Status  Partially Met      OT LONG TERM GOAL #5   Title  Lymphedema (LE) management/ self-care:  During Management Phase CDT Pt to sustain current limb volumes within 3%, and all other clinical gains achieved during OT treatment independently (to control limb swelling and associated pain, to  limit LE progression, to decrease infection risk and to limit further functional decline.    Baseline  dependent    Time  6    Period  Months    Status  On-going            Plan - 10/29/17 1647    Clinical Impression Statement  Pt presents with signs and symptoms of LLE cellulitis infection, including acute worsening of leg swelling, redness and increased skin temperature. Lymphorrhea is noted on anterior surface at scattered distendend pores. Skin loosened during CDT is flaking off with expended limb volume. Caregiver reports Pt has not been wrapped for 5 days  after instructing caregivers to remove wraps due to leg discomfort. No manual therapy provovided today in keeping w/ cellulitis precautions. Pt agrees with plan to have caregiver transport her directly from outpatient rehab gym to [facility] to address cellulitis. Pt  understands  she can not resume OT for CDT until  antibiotics are taken for at least 48 hrs. Nurse from PCP called after Pt left OT session requesting additional info. Met with her by phomne and discussed concerns including concerns re infection and possible CHF.    Occupational Profile and client history currently impacting functional performance  Generalized weekness limits Pt's unctional mobility and ambulation./ She will benefit from Physical Therapy to address these  deficits once LE is complete.    Occupational performance deficits (Please refer to evaluation for details):  ADL's;IADL's;Work;Leisure;Social Participation    Rehab Potential  Fair    Current Impairments/barriers affecting progress:  Pt prognosis without max caregiver assistance with LE self care home program between OT visits is poor. Caregiver attendance, participation in therapy sessions, and daily assistance with LE self care going forward is essential for retaining clinical gains over time.    OT Frequency  3x / week    OT Treatment/Interventions  Self-care/ADL training;Manual lymph drainage;DME and/or AE instruction;Compression bandaging;Patient/family education;Therapeutic exercise;Manual Therapy;Therapeutic activities       Patient will benefit from skilled therapeutic intervention in order to improve the following deficits and impairments:  Abnormal gait, Decreased skin integrity, Decreased knowledge of precautions, Decreased scar mobility, Impaired perceived functional ability, Decreased activity tolerance, Decreased knowledge of use of DME, Decreased strength, Impaired flexibility, Decreased balance, Decreased mobility, Difficulty walking, Obesity, Decreased cognition, Decreased range of motion, Increased edema, Pain  Visit Diagnosis: Lymphedema, not elsewhere classified    Problem List Patient Active Problem List   Diagnosis Date Noted  . Acute respiratory failure ([place]) 12/07/2016  . Spontaneous pneumothorax  12/05/2016  . Acute respiratory failure with hypoxia ([place]) 08/07/2016  . Influenza 08/05/2016  . Dyspnea 08/05/2016  . Constipation 08/05/2016  . Back pain 08/05/2016  . Generalized weakness 08/05/2016  . Intractable back pain 08/01/2016    Andrey Spearman, MS, OTR/L, [facility] 10/29/17 4:54 PM  [place] MAIN [facility] SERVICES [address] [place], [place], [address] Phone: [phone]   Fax:  [phone]  Name: [name] MRN: [id number] Date of Birth: [birth year]

## 2017-11-02 ENCOUNTER — Ambulatory Visit: Payer: Medicare Other | Admitting: Occupational Therapy

## 2017-11-03 ENCOUNTER — Encounter: Payer: Medicare Other | Admitting: Occupational Therapy

## 2017-11-04 ENCOUNTER — Ambulatory Visit: Payer: Medicare Other | Attending: Physician Assistant | Admitting: Occupational Therapy

## 2017-11-04 DIAGNOSIS — I890 Lymphedema, not elsewhere classified: Secondary | ICD-10-CM | POA: Diagnosis present

## 2017-11-04 NOTE — Therapy (Signed)
[place] MAIN [facility] SERVICES [address] [place], [place], [address] Phone: [phone]   Fax:  [phone]  Occupational Therapy Treatment  Patient Details  Name: [name] MRN: [id number] Date of Birth: [birth year] Referring Provider: Woodroe Chen, PA-C   Encounter Date: 11/04/2017  OT End of Session - 11/04/17 1158    Visit Number  21    Number of Visits  36    Date for OT Re-Evaluation  11/10/17    OT Start Time  1120    OT Stop Time  1153    OT Time Calculation (min)  33 min    Equipment Utilized During Treatment  none- compression unavailable    Activity Tolerance  Patient tolerated treatment well;Patient limited by pain;Other (comment);Treatment limited secondary to medical complications (Comment) Pt limited by signs and symptoms of RLE cellulitis infection. Pt limitwed by time constraints- arrived 20 min late for 60 min appt. CG did not bring compression wraps.    Behavior During Therapy  [facility] for tasks assessed/performed       Past Medical History:  Diagnosis Date  . COPD (chronic obstructive pulmonary disease) ([place])   . DVT (deep venous thrombosis) ([place])   . Hypertension   . Pneumothorax     Past Surgical History:  Procedure Laterality Date  . ABDOMINAL HYSTERECTOMY    . colon cancer    . HEMIARTHROPLASTY SHOULDER FRACTURE Left   . TIBIA FRACTURE SURGERY Left     There were no vitals filed for this visit.  Subjective Assessment - 11/04/17 1153    Subjective   Pt presents for OT visit 21//36 to address BLE lymphedema (LE) Pt is accompainied by private caregiver. Pt presents without compression wraps in place. LLE below the knee conmtinues to be red, warm  i and swollen. CG reports  Doppler study  performed at [facility] was negative for DVT. She reports Pt saw PCP on Monday and pulmpnologist yesterday.     Pertinent History  Colon cx; hx of DVT, s/p 2012 L tibia fx; hyperlipiemia;; former smoker; hx L lateral  calf venous stasis ulcer    Limitations  difficulty walking, impaired functional mobility and transfers, chronic BLE leg swelling and pain; impaired basic ADLs- LB bathing , dressing, and fitting LB clothing and street shoes, difficulty lifting legs to elevate and comple transfers, impaired IADLs- requires assistance for all productive tasks and home management activities. Decreased social participation- homebound without assistance from family    Patient Stated Goals  get swelling down and keep it from getting worse    Currently in Pain?  Yes    Pain Location  Leg    Pain Orientation  Right    Pain Descriptors / Indicators  Tightness;Burning;Sore;Heaviness;Pressure;Tender;Discomfort    Pain Type  Acute pain    Pain Onset  In the past 7 days intermittant w/ palpation    Pain Frequency  Constant                   OT Treatments/Exercises (OP) - 11/04/17 0001      ADLs   ADL Education Given  Yes      Manual Therapy   Manual Therapy  Edema management             OT Education - 11/04/17 1157    Education provided  Yes    Education Details  Pt and CG edu for LE self care , including LE precautions, cellulitis description and treatment, and  infection signs and symptoms.    Person(s) Educated  Patient;Caregiver(s)    Methods  Explanation;Demonstration    Comprehension  Verbalized understanding;Returned demonstration          OT Long Term Goals - 10/29/17 1653      OT LONG TERM GOAL #1   Title  Pt mod A w/ lymphedema precautions and prevention principals and strategies with caregiver assistance using printed reference to limit LE progression and infection risk.    Baseline  dependent 08/25/2017: Goal met for family caregiver- Son.    Time  2    Period  Weeks    Status  Not Met      OT LONG TERM GOAL #2   Title  Lymphedema (LE) management/ self-care: Pt able to apply thigh length, multi layered, gradient compression wraps with Maximum caregiver assistance  using  correct  techniques within 2 weeks to achieve optimal limb volume reductions in preparation for fitting compression garments/ devices bilaterally.    Baseline  Dependent 08/25/2017: Goal Met    Time  2    Period  Weeks    Status  Achieved      OT LONG TERM GOAL #3   Title  Lymphedema (LE) management/ self-care:  Pt to achieve at least 10%  BLE limb volume reductions  during Intensive Phase CDT to limit LE progression, to reduce pain, to improve ADLs performance.    Baseline  dependent  08/25/2017: Partially met for LLE. RLE rx not started yet    Time  12    Period  Weeks    Status  Partially Met      OT LONG TERM GOAL #4   Title  Lymphedema (LE) management/ self-care:  Pt to tolerate daily compression wraps, compression garments and/ or HOS devices in keeping w/ prescribed wear regime within 1 week of issue date of each to progress and retain clinical and functional gains and to limit LE progression.    Baseline  dependent  08/25/2017: partially met- met for wraps only    Time  12    Period  Weeks    Status  Partially Met      OT LONG TERM GOAL #5   Title  Lymphedema (LE) management/ self-care:  During Management Phase CDT Pt to sustain current limb volumes within 3%, and all other clinical gains achieved during OT treatment independently (to control limb swelling and associated pain, to  limit LE progression, to decrease infection risk and to limit further functional decline.    Baseline  dependent    Time  6    Period  Months    Status  On-going            Plan - 11/04/17 1201    Clinical Impression Statement  Pt arrived 20 min late for 60 min OT session. CG did not bring compression wraps or garment. EMR reveals recent [facility] Doppler was negative for DVT. Signs and symptoms of infection persist, despite oral antibiotic Rx. Symptoms are not improved since last visit ~ 1 week ago. RLE redness, increased skin temp and lymphorrhea are observed again today. OT unable to perform manual   therapies for lymphedema care until infection is clear and PCP provides medical release to resume CDT a with compression. Pt agrees to stop at PCP office on the way home after this session and request consult .     Occupational Profile and client history currently impacting functional performance  Generalized weekness limits Pt's unctional mobility and  ambulation./ She will benefit from Physical Therapy to address these deficits once LE is complete.    Occupational performance deficits (Please refer to evaluation for details):  ADL's;IADL's;Work;Leisure;Social Participation    Rehab Potential  Fair    Current Impairments/barriers affecting progress:  Pt prognosis without max caregiver assistance with LE self care home program between OT visits is poor. Caregiver attendance, participation in therapy sessions, and daily assistance with LE self care going forward is essential for retaining clinical gains over time.    OT Frequency  3x / week    OT Treatment/Interventions  Self-care/ADL training;Manual lymph drainage;DME and/or AE instruction;Compression bandaging;Patient/family education;Therapeutic exercise;Manual Therapy;Therapeutic activities       Patient will benefit from skilled therapeutic intervention in order to improve the following deficits and impairments:  Abnormal gait, Decreased skin integrity, Decreased knowledge of precautions, Decreased scar mobility, Impaired perceived functional ability, Decreased activity tolerance, Decreased knowledge of use of DME, Decreased strength, Impaired flexibility, Decreased balance, Decreased mobility, Difficulty walking, Obesity, Decreased cognition, Decreased range of motion, Increased edema, Pain  Visit Diagnosis: Lymphedema, not elsewhere classified    Problem List Patient Active Problem List   Diagnosis Date Noted  . Acute respiratory failure ([place]) 12/07/2016  . Spontaneous pneumothorax 12/05/2016  . Acute respiratory failure with hypoxia ([place])  08/07/2016  . Influenza 08/05/2016  . Dyspnea 08/05/2016  . Constipation 08/05/2016  . Back pain 08/05/2016  . Generalized weakness 08/05/2016  . Intractable back pain 08/01/2016    Andrey Spearman, MS, OTR/L, [facility] 11/04/17 12:07 PM  [place] MAIN [facility] SERVICES [address] [place], [place], [address] Phone: [phone]   Fax:  [phone]  Name: ELSBETH YEARICK MRN: [id number] Date of Birth: 06-27-1930

## 2017-11-05 ENCOUNTER — Encounter: Payer: Medicare Other | Admitting: Occupational Therapy

## 2017-11-09 ENCOUNTER — Ambulatory Visit: Payer: Medicare Other | Admitting: Occupational Therapy

## 2017-11-09 DIAGNOSIS — I890 Lymphedema, not elsewhere classified: Secondary | ICD-10-CM

## 2017-11-09 NOTE — Therapy (Signed)
[place] MAIN [facility] SERVICES [address] [place], [place], [address] Phone: [phone]   Fax:  [phone]  Occupational Therapy Treatment  Patient Details  Name: [name] MRN: [id number] Date of Birth: [birth year] Referring Provider: Woodroe Chen, PA-C   Encounter Date: 11/09/2017  OT End of Session - 11/09/17 1444    Visit Number  22    Number of Visits  36    Date for OT Re-Evaluation  11/10/17    OT Start Time  0210    OT Stop Time  0236    OT Time Calculation (min)  26 min    Equipment Utilized During Treatment  none- compression unavailable    Activity Tolerance  Patient tolerated treatment well;Patient limited by pain;Other (comment);Treatment limited secondary to medical complications (Comment) Pt limited by signs and symptoms of RLE cellulitis infection. Pt limitwed by time constraints- arrived 20 min late for 60 min appt. CG did not bring compression wraps.    Behavior During Therapy  [facility] for tasks assessed/performed       Past Medical History:  Diagnosis Date  . COPD (chronic obstructive pulmonary disease) ([place])   . DVT (deep venous thrombosis) ([place])   . Hypertension   . Pneumothorax     Past Surgical History:  Procedure Laterality Date  . ABDOMINAL HYSTERECTOMY    . colon cancer    . HEMIARTHROPLASTY SHOULDER FRACTURE Left   . TIBIA FRACTURE SURGERY Left     There were no vitals filed for this visit.  Subjective Assessment - 11/09/17 1440    Subjective   Pt presents for OT visit 22//36 to address BLE lymphedema (LE) Pt is accompainied by private caregiver. Pt presents without compression wraps in place and caregiver states she did not bring wraps to visit. Caregiver and Pt are unable to confirm that Pt was seen for unchanged cellulitis infection by a physician  after last OT visit for LE on Wdnesday , May1 as directedd due to unchanged infection symptoms.     Pertinent History  Colon cx; hx of DVT, s/p 2012 L  tibia fx; hyperlipiemia;; former smoker; hx L lateral calf venous stasis ulcer    Limitations  difficulty walking, impaired functional mobility and transfers, chronic BLE leg swelling and pain; impaired basic ADLs- LB bathing , dressing, and fitting LB clothing and street shoes, difficulty lifting legs to elevate and comple transfers, impaired IADLs- requires assistance for all productive tasks and home management activities. Decreased social participation- homebound without assistance from family    Patient Stated Goals  get swelling down and keep it from getting worse    Currently in Pain?  No/denies    Pain Onset  In the past 7 days intermittant w/ palpation                                OT Long Term Goals - 10/29/17 1653      OT LONG TERM GOAL #1   Title  Pt mod A w/ lymphedema precautions and prevention principals and strategies with caregiver assistance using printed reference to limit LE progression and infection risk.    Baseline  dependent 08/25/2017: Goal met for family caregiver- Son.    Time  2    Period  Weeks    Status  Not Met      OT LONG TERM GOAL #2   Title  Lymphedema (LE) management/ self-care: Pt  able to apply thigh length, multi layered, gradient compression wraps with Maximum caregiver assistance  using correct  techniques within 2 weeks to achieve optimal limb volume reductions in preparation for fitting compression garments/ devices bilaterally.    Baseline  Dependent 08/25/2017: Goal Met    Time  2    Period  Weeks    Status  Achieved      OT LONG TERM GOAL #3   Title  Lymphedema (LE) management/ self-care:  Pt to achieve at least 10%  BLE limb volume reductions  during Intensive Phase CDT to limit LE progression, to reduce pain, to improve ADLs performance.    Baseline  dependent  08/25/2017: Partially met for LLE. RLE rx not started yet    Time  12    Period  Weeks    Status  Partially Met      OT LONG TERM GOAL #4   Title  Lymphedema  (LE) management/ self-care:  Pt to tolerate daily compression wraps, compression garments and/ or HOS devices in keeping w/ prescribed wear regime within 1 week of issue date of each to progress and retain clinical and functional gains and to limit LE progression.    Baseline  dependent  08/25/2017: partially met- met for wraps only    Time  12    Period  Weeks    Status  Partially Met      OT LONG TERM GOAL #5   Title  Lymphedema (LE) management/ self-care:  During Management Phase CDT Pt to sustain current limb volumes within 3%, and all other clinical gains achieved during OT treatment independently (to control limb swelling and associated pain, to  limit LE progression, to decrease infection risk and to limit further functional decline.    Baseline  dependent    Time  6    Period  Months    Status  On-going            Plan - 11/09/17 1445    Clinical Impression Statement  Pt and caregiver are unable to confirmn that Pt sought medical attention after last OT visit on May 1 as directed due to unchanged cellulitis symptoms. Danette, RN, triage nurse for Dr. Alvie Heidelberg confirmed that Pt was not seen and has not communicated with their clinic. Danette agreed to fax a medical release to resume OT for LE care , or to provide a verbal release to resume OT over the phone after Pt's visit tomorrow. OT suggested to caregiver that they set up a binder las a communication log in the home to share information [place] Pt's medical appointments , etc. P No charges for tday's visit. OT to resume Intensive Phase CDT to LLE once medical release is obtained.     Occupational Profile and client history currently impacting functional performance  Generalized weekness limits Pt's unctional mobility and ambulation./ She will benefit from Physical Therapy to address these deficits once LE is complete.    Occupational performance deficits (Please refer to evaluation for details):  ADL's;IADL's;Work;Leisure;Social  Participation    Rehab Potential  Fair    Current Impairments/barriers affecting progress:  Pt prognosis without max caregiver assistance with LE self care home program between OT visits is poor. Caregiver attendance, participation in therapy sessions, and daily assistance with LE self care going forward is essential for retaining clinical gains over time.    OT Frequency  3x / week    OT Treatment/Interventions  Self-care/ADL training;Manual lymph drainage;DME and/or AE instruction;Compression bandaging;Patient/family education;Therapeutic exercise;Manual Therapy;Therapeutic  activities       Patient will benefit from skilled therapeutic intervention in order to improve the following deficits and impairments:  Abnormal gait, Decreased skin integrity, Decreased knowledge of precautions, Decreased scar mobility, Impaired perceived functional ability, Decreased activity tolerance, Decreased knowledge of use of DME, Decreased strength, Impaired flexibility, Decreased balance, Decreased mobility, Difficulty walking, Obesity, Decreased cognition, Decreased range of motion, Increased edema, Pain  Visit Diagnosis: Lymphedema, not elsewhere classified    Problem List Patient Active Problem List   Diagnosis Date Noted  . Acute respiratory failure ([place]) 12/07/2016  . Spontaneous pneumothorax 12/05/2016  . Acute respiratory failure with hypoxia ([place]) 08/07/2016  . Influenza 08/05/2016  . Dyspnea 08/05/2016  . Constipation 08/05/2016  . Back pain 08/05/2016  . Generalized weakness 08/05/2016  . Intractable back pain 08/01/2016    Andrey Spearman, MS, OTR/L, [facility] 11/09/17 2:53 PM  [place] MAIN [facility] SERVICES [address] [place], [place], [address] Phone: [phone]   Fax:  [phone]  Name: [name] MRN: [id number] Date of Birth: 11-May-1930

## 2017-11-10 ENCOUNTER — Encounter: Payer: Medicare Other | Admitting: Occupational Therapy

## 2017-11-10 ENCOUNTER — Encounter: Payer: Medicare Other | Attending: Physician Assistant | Admitting: Physician Assistant

## 2017-11-10 DIAGNOSIS — Z85038 Personal history of other malignant neoplasm of large intestine: Secondary | ICD-10-CM | POA: Insufficient documentation

## 2017-11-10 DIAGNOSIS — Z86718 Personal history of other venous thrombosis and embolism: Secondary | ICD-10-CM | POA: Diagnosis not present

## 2017-11-10 DIAGNOSIS — Z87891 Personal history of nicotine dependence: Secondary | ICD-10-CM | POA: Diagnosis not present

## 2017-11-10 DIAGNOSIS — L97822 Non-pressure chronic ulcer of other part of left lower leg with fat layer exposed: Secondary | ICD-10-CM | POA: Diagnosis not present

## 2017-11-10 DIAGNOSIS — I890 Lymphedema, not elsewhere classified: Secondary | ICD-10-CM | POA: Diagnosis not present

## 2017-11-11 ENCOUNTER — Ambulatory Visit: Payer: Medicare Other | Admitting: Occupational Therapy

## 2017-11-12 ENCOUNTER — Encounter: Payer: Medicare Other | Admitting: Occupational Therapy

## 2017-11-16 ENCOUNTER — Ambulatory Visit: Payer: Medicare Other | Admitting: Occupational Therapy

## 2017-11-18 ENCOUNTER — Encounter: Payer: Medicare Other | Admitting: Occupational Therapy

## 2017-11-23 ENCOUNTER — Encounter: Payer: Medicare Other | Admitting: Occupational Therapy

## 2017-11-25 ENCOUNTER — Encounter: Payer: Medicare Other | Admitting: Occupational Therapy

## 2017-12-02 ENCOUNTER — Encounter: Payer: Medicare Other | Admitting: Occupational Therapy

## 2017-12-02 NOTE — Progress Notes (Addendum)
[place], [place] ([id number]) Visit Report for 11/10/2017 Allergy List Details Patient Name: [name], [name]. Date of Service: 11/10/2017 8:00 AM Medical Record Number: [id number] Patient Account Number: [id number] Date of Birth/Sex: 1929-07-20 (84 y.o. F) Treating RN: Phillis Haggis Primary Care Syvanna Ciolino: Etheleen Nicks Other Clinician: Referring Lynise Porr: Tiana Loft Treating Keyla Milone/Extender: STONE III, HOYT Weeks in Treatment: 0 Allergies Active Allergies aspirin Allergy Notes Electronic Signature(s) Signed: 12/02/2017 7:45:54 AM By: Arnette Norris Entered By: Arnette Norris on 11/10/2017 08:24:31 Propps, Kendy Shela Commons ([id number]) -------------------------------------------------------------------------------- Arrival Information Details Patient Name: [name], [name]. Date of Service: 11/10/2017 8:00 AM Medical Record Number: [id number] Patient Account Number: [id number] Date of Birth/Sex: 07/14/1929 (84 y.o. F) Treating RN: Phillis Haggis Primary Care Earon Rivest: Etheleen Nicks Other Clinician: Referring Mariaguadalupe Fialkowski: Tiana Loft Treating Koji Niehoff/Extender: Linwood Dibbles, HOYT Weeks in Treatment: 0 Visit Information Patient Arrived: Wheel Chair Arrival Time: 08:10 Accompanied By: caregiver Transfer Assistance: EasyPivot Patient Lift Patient Identification Verified: Yes Secondary Verification Process Yes Completed: Patient Requires Transmission-Based No Precautions: Patient Has Alerts: No History Since Last Visit All ordered tests and consults were completed: No Any [name] allergies or adverse reactions: No Signs or symptoms of abuse/neglect since last visito No Hospitalized since last visit: No Implantable device outside of the clinic excluding cellular tissue based products placed in the center since last visit: No Pain Present Now: No Electronic Signature(s) Signed: 12/02/2017 7:45:54 AM By: Arnette Norris Entered By: Arnette Norris on 11/10/2017 08:22:42 Bogart, Ibtisam Shela Commons  ([id number]) -------------------------------------------------------------------------------- Clinic Level of Care Assessment Details Patient Name: [name], [name]. Date of Service: 11/10/2017 8:00 AM Medical Record Number: [id number] Patient Account Number: [id number] Date of Birth/Sex: 12/10/1929 (84 y.o. F) Treating RN: Phillis Haggis Primary Care Udell Mazzocco: Etheleen Nicks Other Clinician: Referring Dareld Mcauliffe: Tiana Loft Treating Bronte Sabado/Extender: STONE III, HOYT Weeks in Treatment: 0 Clinic Level of Care Assessment Items TOOL 1 Quantity Score X - Use when EandM and Procedure is performed on INITIAL visit 1 0 ASSESSMENTS - Nursing Assessment / Reassessment X - General Physical Exam (combine w/ comprehensive assessment (listed just below) when 1 20 performed on [name] pt. evals) X- 1 25 Comprehensive Assessment (HX, ROS, Risk Assessments, Wounds Hx, etc.) ASSESSMENTS - Wound and Skin Assessment / Reassessment  - Dermatologic / Skin Assessment (not related to wound area) 0 ASSESSMENTS - Ostomy and/or Continence Assessment and Care  - Incontinence Assessment and Management 0  - 0 Ostomy Care Assessment and Management (repouching, etc.) PROCESS - Coordination of Care  - Simple Patient / Family Education for ongoing care 0 X- 1 20 Complex (extensive) Patient / Family Education for ongoing care X- 1 10 Staff obtains Chiropractor, Records, Test Results / Process Orders X- 1 10 Staff telephones HHA, Nursing Homes / Clarify orders / etc  - 0 Routine Transfer to another Facility (non-emergent condition)  - 0 Routine Hospital Admission (non-emergent condition) X- 1 15 [name] Admissions / Manufacturing engineer / Ordering NPWT, Apligraf, etc.  - 0 Emergency Hospital Admission (emergent condition) PROCESS - Special Needs  - Pediatric / Minor Patient Management 0  - 0 Isolation Patient Management  - 0 Hearing / Language / Visual special needs  - 0 Assessment of  Community assistance (transportation, D/C planning, etc.)  - 0 Additional assistance / Altered mentation  - 0 Support Surface(s) Assessment (bed, cushion, seat, etc.) Hovatter, Joye J. ([id number]) INTERVENTIONS - Miscellaneous  - External ear exam 0  - 0 Patient Transfer (multiple staff / Nurse, adult / Similar devices)  - 0 Simple Staple / Suture  removal (25 or less)  - 0 Complex Staple / Suture removal (26 or more)  - 0 Hypo/Hyperglycemic Management (do not check if billed separately) X- 1 15 Ankle / Brachial Index (ABI) - do not check if billed separately Has the patient been seen at the hospital within the last three years: Yes Total Score: 115 Level Of Care: [name]/Established - Level 3 Electronic Signature(s) Signed: 11/10/2017 5:11:14 PM By: Alejandro Mulling Entered By: Alejandro Mulling on 11/10/2017 16:51:16 Mannina, Allysa J. ([id number]) -------------------------------------------------------------------------------- Encounter Discharge Information Details Patient Name: [name], [name]. Date of Service: 11/10/2017 8:00 AM Medical Record Number: [id number] Patient Account Number: [id number] Date of Birth/Sex: Nov 22, 1929 (84 y.o. F) Treating RN: Phillis Haggis Primary Care Miro Balderson: Etheleen Nicks Other Clinician: Referring Janelli Welling: Tiana Loft Treating Cynithia Hakimi/Extender: Linwood Dibbles, HOYT Weeks in Treatment: 0 Encounter Discharge Information Items Discharge Condition: Stable Ambulatory Status: Wheelchair Discharge Destination: Home Transportation: Private Auto Accompanied By: caregiver Schedule Follow-up Appointment: Yes Clinical Summary of Care: Provided on 11/10/2017 Form Type Recipient Paper Patient IS Electronic Signature(s) Signed: 11/10/2017 4:56:20 PM By: Renne Crigler Entered By: Renne Crigler on 11/10/2017 09:46:57 Vanderzanden, Berenice J. ([id number]) -------------------------------------------------------------------------------- Lower Extremity  Assessment Details Patient Name: [place], [name]. Date of Service: 11/10/2017 8:00 AM Medical Record Number: [id number] Patient Account Number: [id number] Date of Birth/Sex: 12-05-1929 (84 y.o. F) Treating RN: Phillis Haggis Primary Care Bradon Fester: Etheleen Nicks Other Clinician: Referring Layci Stenglein: Tiana Loft Treating Ayodele Hartsock/Extender: STONE III, HOYT Weeks in Treatment: 0 Edema Assessment Assessed: [Left: Yes] [Right: Yes] Edema: [Left: Yes] [Right: Yes] Calf Left: Right: Point of Measurement: 32 cm From Medial Instep 47 cm 49 cm Ankle Left: Right: Point of Measurement: 10 cm From Medial Instep 28.2 cm 28.4 cm Vascular Assessment Claudication: Claudication Assessment [Left:None] [Right:None] Pulses: Dorsalis Pedis Palpable: [Left:No] [Right:No] Doppler Audible: [Left:Yes] [Right:Yes] Posterior Tibial Palpable: [Left:No] [Right:No] Doppler Audible: [Left:Yes] [Right:Yes] Popliteal Palpable: [Left:No] [Right:No] Doppler Audible: [Left:Yes] [Right:Yes] Extremity colors, hair growth, and conditions: Extremity Color: [Left:Red] [Right:Normal] Hair Growth on Extremity: [Left:No] [Right:No] Temperature of Extremity: [Left:Warm] [Right:Warm] Capillary Refill: [Left:< 3 seconds] [Right:< 3 seconds] Blood Pressure: Brachial: [Left:172] [Right:168] Dorsalis Pedis: 138 [Left:Dorsalis Pedis: 122] Ankle: Posterior Tibial: 132 [Left:Posterior Tibial: 148 0.80] [Right:0.86] Toe Nail Assessment Left: Right: Thick: Yes Yes Discolored: Yes Yes Deformed: Yes Yes Improper Length and Hygiene: Yes Yes RELLA, EGELSTON ([id number]) Electronic Signature(s) Signed: 11/10/2017 5:11:14 PM By: Alejandro Mulling Signed: 12/02/2017 7:45:54 AM By: Arnette Norris Entered By: Arnette Norris on 11/10/2017 08:58:02 Moncrief, Love J. ([id number]) -------------------------------------------------------------------------------- Multi Wound Chart Details Patient Name: [place], [name]. Date of Service:  11/10/2017 8:00 AM Medical Record Number: [id number] Patient Account Number: [id number] Date of Birth/Sex: 11-Jan-1930 (84 y.o. F) Treating RN: Phillis Haggis Primary Care Coleson Kant: Etheleen Nicks Other Clinician: Referring Keysean Savino: Tiana Loft Treating Besse Miron/Extender: STONE III, HOYT Weeks in Treatment: 0 Vital Signs Height(in): 66 Pulse(bpm): 66 Weight(lbs): 155 Blood Pressure(mmHg): 171/60 Body Mass Index(BMI): 25 Temperature(F): 98.2 Respiratory Rate 18 (breaths/min): Photos: [4:No Photos] [N/A:N/A] Wound Location: [4:Left Lower Leg - Lateral] [N/A:N/A] Wounding Event: [4:Trauma] [N/A:N/A] Primary Etiology: [4:Lymphedema] [N/A:N/A] Comorbid History: [4:Lymphedema, Asthma, Deep Vein Thrombosis, Received Chemotherapy, Received Radiation] [N/A:N/A] Date Acquired: [4:11/10/2017] [N/A:N/A] Weeks of Treatment: [4:0] [N/A:N/A] Wound Status: [4:Open] [N/A:N/A] Measurements L x W x D [4:0.7x6.8x0.2] [N/A:N/A] (cm) Area (cm) : [4:3.738] [N/A:N/A] Volume (cm) : [4:0.748] [N/A:N/A] Classification: [4:Full Thickness Without Exposed Support Structures] [N/A:N/A] Exudate Amount: [4:Large] [N/A:N/A] Exudate Type: [4:Serous] [N/A:N/A] Exudate Color: [4:amber] [N/A:N/A] Wound Margin: [4:Distinct, outline attached] [N/A:N/A] Granulation Amount: [4:Small (1-33%)] [N/A:N/A]  Granulation Quality: [4:Red, Pink] [N/A:N/A] Necrotic Amount: [4:Large (67-100%)] [N/A:N/A] Necrotic Tissue: [4:Eschar, Adherent Slough] [N/A:N/A] Exposed Structures: [4:Fat Layer (Subcutaneous Tissue) Exposed: Yes Fascia: No Tendon: No Muscle: No Joint: No Bone: No] [N/A:N/A] Epithelialization: [4:None] [N/A:N/A] Periwound Skin Texture: [4:Induration: Yes Excoriation: No Callus: No Crepitus: No] [N/A:N/A] Rash: No Scarring: No Periwound Skin Moisture: Maceration: No N/A N/A Dry/Scaly: No Periwound Skin Color: Erythema: Yes N/A N/A Atrophie Blanche: No Cyanosis: No Ecchymosis: No Hemosiderin  Staining: No Mottled: No Pallor: No Rubor: No Erythema Location: Circumferential N/A N/A Temperature: No Abnormality N/A N/A Tenderness on Palpation: Yes N/A N/A Wound Preparation: Ulcer Cleansing: N/A N/A Rinsed/Irrigated with Saline Topical Anesthetic Applied: Other: liodocaione 4% Treatment Notes Electronic Signature(s) Signed: 11/10/2017 5:11:14 PM By: Alejandro Mulling Entered By: Alejandro Mulling on 11/10/2017 09:17:40 Coston, Ivelisse Shela Commons ([id number]) -------------------------------------------------------------------------------- Multi-Disciplinary Care Plan Details Patient Name: [place], [name]. Date of Service: 11/10/2017 8:00 AM Medical Record Number: [id number] Patient Account Number: [id number] Date of Birth/Sex: [birth year] (84 y.o. F) Treating RN: Phillis Haggis Primary Care Aemon Koeller: Etheleen Nicks Other Clinician: Referring Graceanne Guin: Tiana Loft Treating Krrish Freund/Extender: Linwood Dibbles, HOYT Weeks in Treatment: 0 Active Inactive Electronic Signature(s) Signed: 12/22/2017 11:35:26 AM By: Elliot Gurney, BSN, RN, CWS, Kim RN, BSN Signed: 12/24/2017 4:29:52 PM By: Alejandro Mulling Previous Signature: 11/10/2017 5:11:14 PM Version By: Alejandro Mulling Entered By: Elliot Gurney BSN, RN, CWS, Kim on 12/22/2017 11:35:25 [place], [name] ([id number]) -------------------------------------------------------------------------------- Pain Assessment Details Patient Name: [place], [name]. Date of Service: 11/10/2017 8:00 AM Medical Record Number: [id number] Patient Account Number: [id number] Date of Birth/Sex: Sep 29, 1929 (84 y.o. F) Treating RN: Phillis Haggis Primary Care Axiel Fjeld: Etheleen Nicks Other Clinician: Referring Alyas Creary: Tiana Loft Treating Cataleah Stites/Extender: STONE III, HOYT Weeks in Treatment: 0 Active Problems Location of Pain Severity and Description of Pain Patient Has Paino No Site Locations Pain Management and Medication Current Pain Management: Electronic  Signature(s) Signed: 11/10/2017 5:11:14 PM By: Alejandro Mulling Signed: 12/02/2017 7:45:54 AM By: Arnette Norris Entered By: Arnette Norris on 11/10/2017 08:23:53 Dor, Rayvn Shela Commons ([id number]) -------------------------------------------------------------------------------- Patient/Caregiver Education Details Patient Name: [place], [name]. Date of Service: 11/10/2017 8:00 AM Medical Record Number: [id number] Patient Account Number: [id number] Date of Birth/Gender: 08-09-1929 (84 y.o. F) Treating RN: Renne Crigler Primary Care Physician: Etheleen Nicks Other Clinician: Referring Physician: Tiana Loft Treating Physician/Extender: Skeet Simmer in Treatment: 0 Education Assessment Education Provided To: Patient and Caregiver Education Topics Provided [facility]: Handouts: [facility] Methods: Explain/Verbal Responses: State content correctly Wound Debridement: Handouts: Wound Debridement Methods: Explain/Verbal Responses: State content correctly Wound/Skin Impairment: Handouts: Caring for Your Ulcer Responses: State content correctly Electronic Signature(s) Signed: 11/10/2017 4:56:20 PM By: Renne Crigler Entered By: Renne Crigler on 11/10/2017 09:47:22 Quintanar, Telina J. ([id number]) -------------------------------------------------------------------------------- Wound Assessment Details Patient Name: [place], [name]. Date of Service: 11/10/2017 8:00 AM Medical Record Number: [id number] Patient Account Number: [id number] Date of Birth/Sex: 1929/10/25 (84 y.o. F) Treating RN: Phillis Haggis Primary Care Dereon Williamsen: Etheleen Nicks Other Clinician: Referring Jissel Slavens: Tiana Loft Treating Brandon Scarbrough/Extender: STONE III, HOYT Weeks in Treatment: 0 Wound Status Wound Number: 4 Primary Lymphedema Etiology: Wound Location: Left Lower Leg - Lateral Wound Open Wounding Event: Trauma Status: Date Acquired: 11/10/2017 Comorbid  Lymphedema, Asthma, Deep Vein Thrombosis, Weeks Of Treatment: 0 History: Received Chemotherapy, Received Radiation Clustered Wound: No Photos Photo Uploaded By: Renne Crigler on 11/10/2017 16:54:04 Wound Measurements Length: (cm) 0.7 Width: (cm) 6.8 Depth: (cm) 0.2 Area: (cm) 3.738 Volume: (cm) 0.748 % Reduction in Area: %  Reduction in Volume: Epithelialization: None Tunneling: No Undermining: No Wound Description Full Thickness Without Exposed Support Classification: Structures Wound Margin: Distinct, outline attached Exudate Large Amount: Exudate Type: Serous Exudate Color: amber Foul Odor After Cleansing: No Slough/Fibrino Yes Wound Bed Granulation Amount: Small (1-33%) Exposed Structure Granulation Quality: Red, Pink Fascia Exposed: No Necrotic Amount: Large (67-100%) Fat Layer (Subcutaneous Tissue) Exposed: Yes Necrotic Quality: Eschar, Adherent Slough Tendon Exposed: No Muscle Exposed: No Joint Exposed: No Bone Exposed: No Ranker, Khaleah J. ([id number]) Periwound Skin Texture Texture Color No Abnormalities Noted: No No Abnormalities Noted: No Callus: No Atrophie Blanche: No Crepitus: No Cyanosis: No Excoriation: No Ecchymosis: No Induration: Yes Erythema: Yes Rash: No Erythema Location: Circumferential Scarring: No Hemosiderin Staining: No Mottled: No Moisture Pallor: No No Abnormalities Noted: No Rubor: No Dry / Scaly: No Maceration: No Temperature / Pain Temperature: No Abnormality Tenderness on Palpation: Yes Wound Preparation Ulcer Cleansing: Rinsed/Irrigated with Saline Topical Anesthetic Applied: Other: liodocaione 4%, Electronic Signature(s) Signed: 11/10/2017 5:11:14 PM By: Alejandro Mulling Signed: 12/02/2017 7:45:54 AM By: Arnette Norris Entered By: Arnette Norris on 11/10/2017 08:55:51 Emigh, Krystyl J. ([id number]) -------------------------------------------------------------------------------- Vitals Details Patient Name:  [place], [name]. Date of Service: 11/10/2017 8:00 AM Medical Record Number: [id number] Patient Account Number: [id number] Date of Birth/Sex: [birth year] (84 y.o. F) Treating RN: Phillis Haggis Primary Care Batoul Limes: Etheleen Nicks Other Clinician: Referring Evangelyn Crouse: Tiana Loft Treating Hendrik Donath/Extender: STONE III, HOYT Weeks in Treatment: 0 Vital Signs Time Taken: 08:22 Temperature (F): 98.2 Height (in): 66 Pulse (bpm): 66 Source: Stated Respiratory Rate (breaths/min): 18 Weight (lbs): 155 Blood Pressure (mmHg): 171/60 Source: Stated Reference Range: 80 - 120 mg / dl Body Mass Index (BMI): 25 Electronic Signature(s) Signed: 12/02/2017 7:45:54 AM By: Arnette Norris Entered By: Arnette Norris on 11/10/2017 78:46:96

## 2017-12-02 NOTE — Progress Notes (Signed)
[place], [place] ([id number]) Visit Report for 11/10/2017 Chief Complaint Document Details Patient Name: [name], [name]. Date of Service: 11/10/2017 8:00 AM Medical Record Number: [id number] Patient Account Number: [id number] Date of Birth/Sex: [birth year] (84 y.o. F) Treating RN: Phillis Haggis Primary Care Provider: Etheleen Nicks Other Clinician: Referring Provider: Tiana Loft Treating Provider/Extender: Linwood Dibbles, HOYT Weeks in Treatment: 0 Information Obtained from: Patient Chief Complaint Left lateral lower extremity ulcer Electronic Signature(s) Signed: 11/10/2017 1:09:54 PM By: Lenda Kelp PA-C Entered By: Lenda Kelp on 11/10/2017 09:11:04 Binz, [name] J. ([id number]) -------------------------------------------------------------------------------- Debridement Details Patient Name: [name]. Date of Service: 11/10/2017 8:00 AM Medical Record Number: [id number] Patient Account Number: [id number] Date of Birth/Sex: Mar 04, 1930 (84 y.o. F) Treating RN: Phillis Haggis Primary Care Provider: Etheleen Nicks Other Clinician: Referring Provider: Tiana Loft Treating Provider/Extender: STONE III, HOYT Weeks in Treatment: 0 Debridement Performed for Wound #4 Left,Lateral Lower Leg Assessment: Performed By: Physician STONE III, HOYT E., PA-C Debridement Type: Debridement Pre-procedure Verification/Time Yes - 09:23 Out Taken: Start Time: 09:24 Pain Control: Lidocaine Injectable : 2 Total Area Debrided (L x W): 0.7 (cm) x 6.8 (cm) = 4.76 (cm) Tissue and other material Viable, Non-Viable, Subcutaneous, Skin: Dermis debrided: Level: Skin/Subcutaneous Tissue Debridement Description: Excisional Instrument: Forceps, Scissors Bleeding: Minimum Hemostasis Achieved: Pressure End Time: 09:27 Procedural Pain: 0 Post Procedural Pain: 0 Response to Treatment: Procedure was tolerated well Level of Consciousness: Awake and Alert Post Procedure Vitals: Temperature:  97.5 Pulse: 67 Respiratory Rate: 16 Blood Pressure: Systolic Blood Pressure: 175 Diastolic Blood Pressure: 68 Post Debridement Measurements of Total Wound Length: (cm) 0.8 Width: (cm) 6.9 Depth: (cm) 0.2 Volume: (cm) 0.867 Character of Wound/Ulcer Post Debridement: Requires Further Debridement Post Procedure Diagnosis Same as Pre-procedure Electronic Signature(s) Signed: 11/10/2017 1:09:54 PM By: Lenda Kelp PA-C Signed: 11/10/2017 5:11:14 PM By: Alejandro Mulling Entered By: Alejandro Mulling on 11/10/2017 09:30:34 Frandsen, Tationna J. ([id number]) -------------------------------------------------------------------------------- HPI Details Patient Name: [name], [name]. Date of Service: 11/10/2017 8:00 AM Medical Record Number: [id number] Patient Account Number: [id number] Date of Birth/Sex: March 14, 1930 (84 y.o. F) Treating RN: Phillis Haggis Primary Care Provider: Etheleen Nicks Other Clinician: Referring Provider: Tiana Loft Treating Provider/Extender: STONE III, HOYT Weeks in Treatment: 0 History of Present Illness Modifying Factors: Other treatment(s) tried include:she has been wearing what sounds like lymphedema pumps for the last 12 years or so HPI Description: 84 year old patient who comes with bilateral lower extremity edema which she's had for at least 15 years and now an ulcerated area on the left lower extremity which she's had for a month. He was recently seen by the PCP who made a diagnosis of venous stasis ulcer of the left calf and referred her to the wound center. He was placed on clindamycin. Past medical history significant for colon cancer, history of DVT, left tibial fracture in 2012 which required surgery, hyperlipidemia and has been on warfarin for several years. She also gives a history of having lymph pumps and she has been wearing them for about 12 years. She is a former smoker and has quit about 10 years ago. 06/23/2016 -- she has not yet got her venous  duplex study done but she did bring her lymphedema pumps and we have checked him out and they seem to be functioning fine 07/03/2016 -- a venous duplex study has not been scheduled for another month and I believe she has an appointment on January 28 07/31/2016 -- the patient continues to have significant lymphedema and she does not have  appropriate compression stockings with her at home Readmission: 07/21/17 on evaluation today patient presents for reevaluation concerning a small ulceration she has of the left lateral calf region. This has been present for a couple of weeks prior to initial evaluation here in the clinic. She has not been on any recent antibiotics although she has been treated for a deep vein thrombosis which is almost completely resolved she is actually going to be seeing next month a vascular specialist at [facility]. Up to this point she has been managed mainly by her primary care provider according to patient. She does not have any significant discomfort which is good news. Unfortunately she is having some mild pain though note purulent discharge in the lateral calf wound. No fevers, chills, nausea, or vomiting noted at this time. 07/27/17 on evaluation today patient appears to be doing excellent in regard to her left lower extremity ulcer. This is not terribly smaller but it is doing much better there is less leaking from the wound and her leg seems to be much smaller after using the compression over the last week. She has not been using her lymphedema pumps due to the fact that that did bother her last week it was hurting and she even attempted to do this. The good news is that doesn't seem to be as bad at this point and I think we may be able to reinitiate the lymphedema pumps. Again we are attempting to get this ulcer closed and doing better so that she can proceed with treatment at the lymphedema clinic. 08/03/17 on evaluation today patient's wounds have completely closed including  the small area on the left posterior aspect of her calf which we have been watching. She still has significant lymphedema unfortunately but the good news is there are no open ulcers. She is pleased about this. We do need to get her an appointment with the lymphedema clinic. 08/10/17 on evaluation today patient appears to be doing excellent. She has no [name] ulcerations. She has been tolerating the compression wraps since last week's evaluation which is great news. She does have an appointment the lymphedema clinic tomorrow. Readmission: 11/10/17 on evaluation today patient is seen for readmission concerning an injury to her left lower extremity on the anterior/lateral aspect. She tells me that this happened roughly 2 weeks ago when she hit this area on a piece of furniture she has been going to the lymphedema clinic and they have been performing a lymphedema wrap all the way up her leg to about mid thigh. She's been tolerating this without complication up to this point. With that being said she tells me currently that she's [place], [name]. ([id number]) not sure that would even be an appropriate thing to do with the wound and since the wound does not seem to be healing quite as fast she would like she felt she needed to come see [place]. There has not been any wrap on her leg for the past two weeks. Obviously I do believe this has led to her having a little bit more in the way of fluid buildup which likely is also slowing down her healing process. Electronic Signature(s) Signed: 11/10/2017 1:09:54 PM By: Lenda Kelp PA-C Entered By: Lenda Kelp on 11/10/2017 13:01:29 [name], [name] ([id number]) -------------------------------------------------------------------------------- Physical Exam Details Patient Name: [place], [name]. Date of Service: 11/10/2017 8:00 AM Medical Record Number: [id number] Patient Account Number: [id number] Date of Birth/Sex: 21-Aug-1929 (84 y.o. F) Treating RN: Phillis Haggis Primary Care Provider: Ninetta Lights,  Lono.Lees Other Clinician: Referring Provider: Tiana Loft Treating Provider/Extender: STONE III, HOYT Weeks in Treatment: 0 Constitutional patient is hypotensive.. pulse regular and within target range for patient.Marland Kitchen respirations regular, non-labored and within target range for patient.Marland Kitchen temperature within target range for patient.. Well-nourished and well-hydrated in no acute distress. Eyes conjunctiva clear no eyelid edema noted. pupils equal round and reactive to light and accommodation. Ears, Nose, Mouth, and Throat no gross abnormality of ear auricles or external auditory canals. normal hearing noted during conversation. mucus membranes moist. Respiratory normal breathing without difficulty. clear to auscultation bilaterally. Cardiovascular regular rate and rhythm with normal S1, S2. 1+ dorsalis pedis/posterior tibialis pulses. 2+ pitting edema of the bilateral lower extremities. Gastrointestinal (GI) soft, non-tender, non-distended, +BS. no ventral hernia noted. Musculoskeletal unsteady while walking. Psychiatric this patient is able to make decisions and demonstrates good insight into disease process. Alert and Oriented x 3. pleasant and cooperative. Notes On inspection today patient did have a opening in regard to her left anterior/lateral lower extremity which is right at the border of where her compression wrap will in. Subsequently there did not appear to be any evidence of infection although part of the skin flap that was torn back does not appear to be viable at this point it is [name] becoming necrotic and has never reattached since the injury. This did require sharp debridement today to remove the necrotic flap which will allow for this area to heal much more quickly and appropriately. She tolerated this without complication I did utilize 2% lidocaine in the amount of 1 mL to none the border where I was going to be trimming off the  necrotic flap so that she would not feel any discomfort with that procedure. She tolerated this in general without complication. Electronic Signature(s) Signed: 11/10/2017 1:09:54 PM By: Lenda Kelp PA-C Entered By: Lenda Kelp on 11/10/2017 13:03:08 [name], [name] ([id number]) -------------------------------------------------------------------------------- Physician Orders Details Patient Name: [name], [name]. Date of Service: 11/10/2017 8:00 AM Medical Record Number: [id number] Patient Account Number: [id number] Date of Birth/Sex: 09-26-1929 (84 y.o. F) Treating RN: Phillis Haggis Primary Care Provider: Etheleen Nicks Other Clinician: Referring Provider: Tiana Loft Treating Provider/Extender: Linwood Dibbles, HOYT Weeks in Treatment: 0 Verbal / Phone Orders: Yes Clinician: Pinkerton, Debi Read Back and Verified: Yes Diagnosis Coding ICD-10 Coding Code Description I89.0 Lymphedema, not elsewhere classified L97.822 Non-pressure chronic ulcer of other part of left lower leg with fat layer exposed Wound Cleansing Wound #4 Left,Lateral Lower Leg o Clean wound with Normal Saline. o Cleanse wound with mild soap and water o May shower with protection. Anesthetic (add to Medication List) Wound #4 Left,Lateral Lower Leg o Topical Lidocaine 4% cream applied to wound bed prior to debridement (In Clinic Only). Skin Barriers/Peri-Wound Care Wound #4 Left,Lateral Lower Leg o Moisturizing lotion Primary Wound Dressing Wound #4 Left,Lateral Lower Leg o Silver Alginate Secondary Dressing Wound #4 Left,Lateral Lower Leg o ABD pad o XtraSorb Dressing Change Frequency Wound #4 Left,Lateral Lower Leg o Change Dressing Monday, Wednesday, Friday Follow-up Appointments Wound #4 Left,Lateral Lower Leg o Return Appointment in 1 week. Edema Control Wound #4 Left,Lateral Lower Leg o 3 Layer Compression System - Bilateral - unna to anchor Graham, Salote J.  ([id number]) Additional Orders / Instructions Wound #4 Left,Lateral Lower Leg o Increase protein intake. Home Health Wound #4 Left,Lateral Lower Leg o Initiate Home Health for Skilled Nursing o Continue Home Health Visits o Home Health Nurse may visit PRN to address patientos wound care needs. o  FACE TO FACE ENCOUNTER: MEDICARE and MEDICAID PATIENTS: I certify that this patient is under my care and that I had a face-to-face encounter that meets the physician face-to-face encounter requirements with this patient on this date. The encounter with the patient was in whole or in part for the following MEDICAL CONDITION: (primary reason for Home Healthcare) MEDICAL NECESSITY: I certify, that based on my findings, NURSING services are a medically necessary home health service. HOME BOUND STATUS: I certify that my clinical findings support that this patient is homebound (i.e., Due to illness or injury, pt requires aid of supportive devices such as crutches, cane, wheelchairs, walkers, the use of special transportation or the assistance of another person to leave their place of residence. There is a normal inability to leave the home and doing so requires considerable and taxing effort. Other absences are for medical reasons / religious services and are infrequent or of short duration when for other reasons). o If current dressing causes regression in wound condition, may D/C ordered dressing product/s and apply Normal Saline Moist Dressing daily until next Wound Healing Center / Other MD appointment. Notify Wound Healing Center of regression in wound condition at [phone]. o Please direct any NON-WOUND related issues/requests for orders to patient's Primary Care Physician Patient Medications Allergies: aspirin Notifications Medication Indication Start End lidocaine DOSE 1 - topical 4 % cream - 1 cream topical Electronic Signature(s) Signed: 11/10/2017 1:09:54 PM By: Lenda Kelp PA-C Signed: 11/10/2017 5:11:14 PM By: Alejandro Mulling Entered By: Alejandro Mulling on 11/10/2017 09:33:05 [name], [name] J. ([id number]) -------------------------------------------------------------------------------- Prescription 11/10/2017 Patient Name: [name] Provider: Lenda Kelp PA-C Date of Birth: 1930/03/28 NPI#: [id number] Sex: F DEA#: [id number] Phone #: [phone] License #: Patient Address: [facility] [address] [facility] [address] [place], [place] [address] [address], [address] [place], [place] [address] [id number] Allergies aspirin Medication Medication: Route: Strength: Form: lidocaine topical 4% cream Class: TOPICAL LOCAL ANESTHETICS Dose: Frequency / Time: Indication: 1 1 cream topical Number of Refills: Number of Units: 0 Generic Substitution: Start Date: End Date: Administered at Substitution Permitted Facility: Yes Time Administered: Time Discontinued: Note to Pharmacy: Signature(s): Date(s): Electronic Signature(s) Signed: 11/10/2017 1:09:54 PM By: Lenda Kelp PA-C Signed: 11/10/2017 5:11:14 PM By: Alejandro Mulling Entered By: Alejandro Mulling on 11/10/2017 09:33:07 [name], [name] J. ([id number]) [name], [name] J. ([id number]) --------------------------------------------------------------------------------  Problem List Details Patient Name: Altha, [name]. Date of Service: 11/10/2017 8:00 AM Medical Record Number: [id number] Patient Account Number: [id number] Date of Birth/Sex: 1929-10-07 (84 y.o. F) Treating RN: Phillis Haggis Primary Care Provider: Etheleen Nicks Other Clinician: Referring Provider: Tiana Loft Treating Provider/Extender: Linwood Dibbles, HOYT Weeks in Treatment: 0 Active Problems ICD-10 Impacting Encounter Code Description Active Date Wound Healing Diagnosis I89.0 Lymphedema, not elsewhere classified 11/10/2017 Yes L97.822 Non-pressure chronic ulcer of  other part of left lower leg with 11/10/2017 Yes fat layer exposed Inactive Problems Resolved Problems Electronic Signature(s) Signed: 11/10/2017 1:09:54 PM By: Lenda Kelp PA-C Entered By: Lenda Kelp on 11/10/2017 09:10:13 Nobel, Dereon J. ([id number]) -------------------------------------------------------------------------------- Progress Note Details Patient Name: Katrinka Blazing, [name]. Date of Service: 11/10/2017 8:00 AM Medical Record Number: [id number] Patient Account Number: [id number] Date of Birth/Sex: Dec 08, 1929 (85 y.o. F) Treating RN: Phillis Haggis Primary Care Provider: Etheleen Nicks Other Clinician: Referring Provider: Tiana Loft Treating Provider/Extender: Linwood Dibbles, HOYT Weeks in Treatment: 0 Subjective Chief Complaint Information obtained from Patient Left lateral lower extremity ulcer History of Present Illness (  HPI) The following HPI elements were documented for the patient's wound: Modifying Factors: Other treatment(s) tried include:she has been wearing what sounds like lymphedema pumps for the last 12 years or so 84 year old patient who comes with bilateral lower extremity edema which she's had for at least 15 years and now an ulcerated area on the left lower extremity which she's had for a month. He was recently seen by the PCP who made a diagnosis of venous stasis ulcer of the left calf and referred her to the wound center. He was placed on clindamycin. Past medical history significant for colon cancer, history of DVT, left tibial fracture in 2012 which required surgery, hyperlipidemia and has been on warfarin for several years. She also gives a history of having lymph pumps and she has been wearing them for about 12 years. She is a former smoker and has quit about 10 years ago. 06/23/2016 -- she has not yet got her venous duplex study done but she did bring her lymphedema pumps and we have checked him out and they seem to be functioning fine 07/03/2016 --  a venous duplex study has not been scheduled for another month and I believe she has an appointment on January 28 07/31/2016 -- the patient continues to have significant lymphedema and she does not have appropriate compression stockings with her at home Readmission: 07/21/17 on evaluation today patient presents for reevaluation concerning a small ulceration she has of the left lateral calf region. This has been present for a couple of weeks prior to initial evaluation here in the clinic. She has not been on any recent antibiotics although she has been treated for a deep vein thrombosis which is almost completely resolved she is actually going to be seeing next month a vascular specialist at [facility] Up to this point she has been managed mainly by her primary care provider according to patient. She does not have any significant discomfort which is good news. Unfortunately she is having some mild pain though note purulent discharge in the lateral calf wound. No fevers, chills, nausea, or vomiting noted at this time. 07/27/17 on evaluation today patient appears to be doing excellent in regard to her left lower extremity ulcer. This is not terribly smaller but it is doing much better there is less leaking from the wound and her leg seems to be much smaller after using the compression over the last week. She has not been using her lymphedema pumps due to the fact that that did bother her last week it was hurting and she even attempted to do this. The good news is that doesn't seem to be as bad at this point and I think we may be able to reinitiate the lymphedema pumps. Again we are attempting to get this ulcer closed and doing better so that she can proceed with treatment at the lymphedema clinic. 08/03/17 on evaluation today patient's wounds have completely closed including the small area on the left posterior aspect of her calf which we have been watching. She still has significant lymphedema unfortunately  but the good news is there are no open ulcers. She is pleased about this. We do need to get her an appointment with the lymphedema clinic. 08/10/17 on evaluation today patient appears to be doing excellent. She has no [name] ulcerations. She has been tolerating the compression wraps since last week's evaluation which is great news. She does have an appointment the lymphedema clinic [name], [name] J. ([id number]) tomorrow. Readmission: 11/10/17 on evaluation today patient is  seen for readmission concerning an injury to her left lower extremity on the anterior/lateral aspect. She tells me that this happened roughly 2 weeks ago when she hit this area on a piece of furniture she has been going to the lymphedema clinic and they have been performing a lymphedema wrap all the way up her leg to about mid thigh. She's been tolerating this without complication up to this point. With that being said she tells me currently that she's not sure that would even be an appropriate thing to do with the wound and since the wound does not seem to be healing quite as fast she would like she felt she needed to come see [place]. There has not been any wrap on her leg for the past two weeks. Obviously I do believe this has led to her having a little bit more in the way of fluid buildup which likely is also slowing down her healing process. Wound History Patient presents with 1 open wound that has been present for approximately 3 weeks. Patient has been treating wound in the following manner: ointment/ saline flush. Laboratory tests have been performed in the last month. Patient reportedly has not tested positive for an antibiotic resistant organism. Patient reportedly has not tested positive for osteomyelitis. Patient reportedly has not had testing performed to evaluate circulation in the legs. Patient experiences the following problems associated with their wounds: swelling. Patient History Information obtained from  Patient. Allergies aspirin Family History Cancer - Child, Diabetes - Child,Maternal Grandparents, Heart Disease - Maternal Grandparents, Kidney Disease - Maternal Grandparents, No family history of Hypertension, Lung Disease, Seizures, Stroke, Thyroid Problems, Tuberculosis. Social History Former smoker - 20 + years ago, Marital Status - Married, Alcohol Use - Never, Drug Use - No History, Caffeine Use - Never. Medical History Respiratory Denies history of Chronic Obstructive Pulmonary Disease (COPD) Hospitalization/Surgery History - 07/07/2005, [facility], Colon cancer. Review of Systems (ROS) Eyes Complains or has symptoms of Glasses / Contacts - Prescriptions. Denies complaints or symptoms of Dry Eyes, Vision Changes. Ear/Nose/Mouth/Throat Denies complaints or symptoms of Difficult clearing ears, Sinusitis. Hematologic/Lymphatic Denies complaints or symptoms of Bleeding / Clotting Disorders, Human Immunodeficiency Virus. Respiratory Complains or has symptoms of Chronic or frequent coughs. Denies complaints or symptoms of Shortness of Breath. Cardiovascular Complains or has symptoms of LE edema - bilaterally. Denies complaints or symptoms of Chest pain. Gastrointestinal Denies complaints or symptoms of Frequent diarrhea, Nausea, Vomiting. Endocrine Denies complaints or symptoms of Hepatitis, Thyroid disease, Polydypsia (Excessive Thirst). Offerman, Farhana J. ([id number]) Genitourinary Complains or has symptoms of Incontinence/dribbling. Denies complaints or symptoms of Kidney failure/ Dialysis. Immunological Denies complaints or symptoms of Hives, Itching. Integumentary (Skin) Complains or has symptoms of Wounds - Left lateral lower leg, Swelling - legs. Denies complaints or symptoms of Bleeding or bruising tendency, Breakdown. Musculoskeletal Denies complaints or symptoms of Muscle Pain, Muscle Weakness. Neurologic Denies complaints or symptoms of Numbness/parasthesias,  Focal/Weakness. Psychiatric Complains or has symptoms of Claustrophobia - closed spaces. Denies complaints or symptoms of Anxiety. Objective Constitutional patient is hypotensive.. pulse regular and within target range for patient.Marland Kitchen respirations regular, non-labored and within target range for patient.Marland Kitchen temperature within target range for patient.. Well-nourished and well-hydrated in no acute distress. Vitals Time Taken: 8:22 AM, Height: 66 in, Source: Stated, Weight: 155 lbs, Source: Stated, BMI: 25, Temperature: 98.2 F, Pulse: 66 bpm, Respiratory Rate: 18 breaths/min, Blood Pressure: 171/60 mmHg. Eyes conjunctiva clear no eyelid edema noted. pupils equal round and reactive to light and accommodation. Ears,  Nose, Mouth, and Throat no gross abnormality of ear auricles or external auditory canals. normal hearing noted during conversation. mucus membranes moist. Respiratory normal breathing without difficulty. clear to auscultation bilaterally. Cardiovascular regular rate and rhythm with normal S1, S2. 1+ dorsalis pedis/posterior tibialis pulses. 2+ pitting edema of the bilateral lower extremities. Gastrointestinal (GI) soft, non-tender, non-distended, +BS. no ventral hernia noted. Musculoskeletal unsteady while walking. Psychiatric this patient is able to make decisions and demonstrates good insight into disease process. Alert and Oriented x 3. pleasant and cooperative. [place], [name] ([id number]) General Notes: On inspection today patient did have a opening in regard to her left anterior/lateral lower extremity which is right at the border of where her compression wrap will in. Subsequently there did not appear to be any evidence of infection although part of the skin flap that was torn back does not appear to be viable at this point it is [name] becoming necrotic and has never reattached since the injury. This did require sharp debridement today to remove the necrotic flap which will  allow for this area to heal much more quickly and appropriately. She tolerated this without complication I did utilize 2% lidocaine in the amount of 1 mL to none the border where I was going to be trimming off the necrotic flap so that she would not feel any discomfort with that procedure. She tolerated this in general without complication. Integumentary (Hair, Skin) Wound #4 status is Open. Original cause of wound was Trauma. The wound is located on the Left,Lateral Lower Leg. The wound measures 0.7cm length x 6.8cm width x 0.2cm depth; 3.738cm^2 area and 0.748cm^3 volume. There is Fat Layer (Subcutaneous Tissue) Exposed exposed. There is no tunneling or undermining noted. There is a large amount of serous drainage noted. The wound margin is distinct with the outline attached to the wound base. There is small (1-33%) red, pink granulation within the wound bed. There is a large (67-100%) amount of necrotic tissue within the wound bed including Eschar and Adherent Slough. The periwound skin appearance exhibited: Induration, Erythema. The periwound skin appearance did not exhibit: Callus, Crepitus, Excoriation, Rash, Scarring, Dry/Scaly, Maceration, Atrophie Blanche, Cyanosis, Ecchymosis, Hemosiderin Staining, Mottled, Pallor, Rubor. The surrounding wound skin color is noted with erythema which is circumferential. Periwound temperature was noted as No Abnormality. The periwound has tenderness on palpation. 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 There was a Excisional Skin/Subcutaneous Tissue Debridement with a total area of 4.76 sq cm performed by STONE III, HOYT E., PA-C. With the following instrument(s): Forceps, and Scissors. to remove Viable and  Non-Viable tissue/material Material removed includes Subcutaneous Tissue and Skin: Dermis and after achieving pain control using Lidocaine Injectable: 2%. No specimens were taken. A time out was conducted at 09:23, prior to the start of the procedure. A Minimum amount of bleeding was controlled with Pressure. The procedure was tolerated well with a pain level of 0 throughout and a pain level of 0 following the procedure. Patient s Level of Consciousness post procedure was recorded as Awake and Alert and post-procedure vitals were taken including Temperature: 97.5 F, Pulse: 67 bpm, Respiratory Rate: 16 breaths/min, Blood Pressure: (175)/(68) mmHg. Post Debridement Measurements: 0.8cm length x 6.9cm width x 0.2cm depth; 0.867cm^3 volume. Character of Wound/Ulcer Post Debridement requires further debridement.  Post procedure Diagnosis Wound #4: Same as Pre-Procedure Plan Wound Cleansing: Joles, Isabelle J. ([id number]) Wound #4 Left,Lateral Lower Leg: Clean wound with Normal Saline. Cleanse wound with mild soap and water May shower with protection. Anesthetic (add to Medication List): Wound #4 Left,Lateral Lower Leg: Topical Lidocaine 4% cream applied to wound bed prior to debridement (In Clinic Only). Skin Barriers/Peri-Wound Care: Wound #4 Left,Lateral Lower Leg: Moisturizing lotion Primary Wound Dressing: Wound #4 Left,Lateral Lower Leg: Silver Alginate Secondary Dressing: Wound #4 Left,Lateral Lower Leg: ABD pad XtraSorb Dressing Change Frequency: Wound #4 Left,Lateral Lower Leg: Change Dressing Monday, Wednesday, Friday Follow-up Appointments: Wound #4 Left,Lateral Lower Leg: Return Appointment in 1 week. Edema Control: Wound #4 Left,Lateral Lower Leg: 3 Layer Compression System - Bilateral - unna to anchor Additional Orders / Instructions: Wound #4 Left,Lateral Lower Leg: Increase protein intake. Home Health: Wound #4 Left,Lateral Lower Leg: Initiate Home Health for Skilled  Nursing Continue Home Health Visits Home Health Nurse may visit PRN to address patient s wound care needs. FACE TO FACE ENCOUNTER: MEDICARE and MEDICAID PATIENTS: I certify that this patient is under my care and that I had a face-to-face encounter that meets the physician face-to-face encounter requirements with this patient on this date. The encounter with the patient was in whole or in part for the following MEDICAL CONDITION: (primary reason for Home Healthcare) MEDICAL NECESSITY: I certify, that based on my findings, NURSING services are a medically necessary home health service. HOME BOUND STATUS: I certify that my clinical findings support that this patient is homebound (i.e., Due to illness or injury, pt requires aid of supportive devices such as crutches, cane, wheelchairs, walkers, the use of special transportation or the assistance of another person to leave their place of residence. There is a normal inability to leave the home and doing so requires considerable and taxing effort. Other absences are for medical reasons / religious services and are infrequent or of short duration when for other reasons). If current dressing causes regression in wound condition, may D/C ordered dressing product/s and apply Normal Saline Moist Dressing daily until next Wound Healing Center / Other MD appointment. Notify Wound Healing Center of regression in wound condition at [phone]. Please direct any NON-WOUND related issues/requests for orders to patient's Primary Care Physician The following medication(s) was prescribed: lidocaine topical 4 % cream 1 1 cream topical was prescribed at facility Currently I think that she is likely to do well with a silver alginate dressing over the next week. If things continue to do well and there's no significant slough buildup we may want to consider initiating treatment with collagen to try to help this to fill in more quickly. She is in agreement with this  plan. Please see above for specific wound care orders. We will see patient for re-evaluation in 1 week(s) here in the clinic. If anything worsens or changes patient will contact our office for additional recommendations. [name], [name] ([id number]) Electronic Signature(s) Signed: 11/10/2017 1:09:54 PM By: Lenda Kelp PA-C Entered By: Lenda Kelp on 11/10/2017 13:04:09 Varkey, Nyja [name] ([id number]) -------------------------------------------------------------------------------- ROS/PFSH Details Patient Name: Tsaile, [name]. Date of Service: 11/10/2017 8:00 AM Medical Record Number: [id number] Patient Account Number: [id number] Date of Birth/Sex: [birth year] (84 y.o. F) Treating RN: Phillis Haggis Primary Care Provider: Etheleen Nicks Other Clinician: Referring Provider: Tiana Loft Treating Provider/Extender: STONE III, HOYT Weeks in Treatment: 0 Information Obtained From Patient Wound History Do you currently have one or more open woundso Yes How many open  wounds do you currently haveo 1 Approximately how long have you had your woundso 3 weeks How have you been treating your wound(s) until nowo ointment/ saline flush Has your wound(s) ever healed and then re-openedo No Have you had any lab work done in the past montho Yes Who ordered the lab work doneo Dr. Shana Chute Have you tested positive for an antibiotic resistant organism (MRSA, VRE)o No Have you tested positive for osteomyelitis (bone infection)o No Have you had any tests for circulation on your legso No Have you had other problems associated with your woundso Swelling Eyes Complaints and Symptoms: Positive for: Glasses / Contacts - Prescriptions Negative for: Dry Eyes; Vision Changes Medical History: Negative for: Cataracts; Glaucoma; Optic Neuritis Ear/Nose/Mouth/Throat Complaints and Symptoms: Negative for: Difficult clearing ears; Sinusitis Medical History: Negative for: Chronic sinus problems/congestion; Middle  ear problems Hematologic/Lymphatic Complaints and Symptoms: Negative for: Bleeding / Clotting Disorders; Human Immunodeficiency Virus Medical History: Positive for: Lymphedema Negative for: Anemia; Hemophilia; Human Immunodeficiency Virus; Sickle Cell Disease Respiratory Complaints and Symptoms: Positive for: Chronic or frequent coughs Negative for: Shortness of Breath Medical History: Positive for: Asthma - controlled Kroeze, Verdella J. ([id number]) Negative for: Aspiration; Chronic Obstructive Pulmonary Disease (COPD); Pneumothorax; Sleep Apnea; Tuberculosis Cardiovascular Complaints and Symptoms: Positive for: LE edema - bilaterally Negative for: Chest pain Medical History: Positive for: Deep Vein Thrombosis - lower extremties bilaterally Negative for: Angina; Arrhythmia; Congestive Heart Failure; Coronary Artery Disease; Hypertension; Hypotension; Myocardial Infarction; Peripheral Arterial Disease; Peripheral Venous Disease; Phlebitis; Vasculitis Gastrointestinal Complaints and Symptoms: Negative for: Frequent diarrhea; Nausea; Vomiting Medical History: Negative for: Cirrhosis ; Colitis; Crohnos; Hepatitis A; Hepatitis B Endocrine Complaints and Symptoms: Negative for: Hepatitis; Thyroid disease; Polydypsia (Excessive Thirst) Medical History: Negative for: Type I Diabetes; Type II Diabetes Genitourinary Complaints and Symptoms: Positive for: Incontinence/dribbling Negative for: Kidney failure/ Dialysis Medical History: Negative for: End Stage Renal Disease Immunological Complaints and Symptoms: Negative for: Hives; Itching Medical History: Negative for: Lupus Erythematosus; Raynaudos; Scleroderma Integumentary (Skin) Complaints and Symptoms: Positive for: Wounds - Left lateral lower leg; Swelling - legs Negative for: Bleeding or bruising tendency; Breakdown Medical History: Negative for: History of Burn; History of pressure wounds Musculoskeletal Complaints and  Symptoms: Negative for: Muscle Pain; Muscle Weakness Jamroz, Kerryann J. ([id number]) Medical History: Negative for: Gout; Rheumatoid Arthritis; Osteoarthritis; Osteomyelitis Neurologic Complaints and Symptoms: Negative for: Numbness/parasthesias; Focal/Weakness Medical History: Negative for: Dementia; Neuropathy; Quadriplegia; Paraplegia; Seizure Disorder Psychiatric Complaints and Symptoms: Positive for: Claustrophobia - closed spaces Negative for: Anxiety Medical History: Negative for: Anorexia/bulimia; Confinement Anxiety Oncologic Medical History: Positive for: Received Chemotherapy - 2007; Received Radiation - 2007 Immunizations Pneumococcal Vaccine: Received Pneumococcal Vaccination: Yes Implantable Devices Hospitalization / Surgery History Name of Hospital Purpose of Hospitalization/Surgery Date [facility] Colon cancer 07/07/2005 Family and Social History Cancer: Yes - Child; Diabetes: Yes - Child,Maternal Grandparents; Heart Disease: Yes - Maternal Grandparents; Hypertension: No; Kidney Disease: Yes - Maternal Grandparents; Lung Disease: No; Seizures: No; Stroke: No; Thyroid Problems: No; Tuberculosis: No; Former smoker - 20 + years ago; Marital Status - Married; Alcohol Use: Never; Drug Use: No History; Caffeine Use: Never; Financial Concerns: No; Food, Clothing or Shelter Needs: No; Support System Lacking: No; Transportation Concerns: No; Advanced Directives: Yes (Not Provided); Patient does not want information on Advanced Directives; Living Will: Yes (Not Provided); Medical Power of Attorney: Yes - Marcy Siren, son (Not Provided) Physician Affirmation I have reviewed and agree with the above information. Electronic Signature(s) Signed: 11/10/2017 1:09:54 PM By: Lenda Kelp PA-C Signed: 11/10/2017  5:11:14 PM By: Alejandro Mulling Signed: 12/02/2017 7:45:54 AM By: Arnette Norris Entered By: Arnette Norris on 11/10/2017 08:35:22 [place], [place] [name]  ([id number]) -------------------------------------------------------------------------------- SuperBill Details Patient Name: Katrinka Blazing, [name]. Date of Service: 11/10/2017 Medical Record Number: [id number] Patient Account Number: [id number] Date of Birth/Sex: Jul 02, 1930 (84 y.o. F) Treating RN: Phillis Haggis Primary Care Provider: Etheleen Nicks Other Clinician: Referring Provider: Tiana Loft Treating Provider/Extender: Linwood Dibbles, HOYT Weeks in Treatment: 0 Diagnosis Coding ICD-10 Codes Code Description I89.0 Lymphedema, not elsewhere classified L97.822 Non-pressure chronic ulcer of other part of left lower leg with fat layer exposed Facility Procedures CPT4 Code Description: 56213086 99213 - WOUND CARE VISIT-LEV 3 EST PT Modifier: Quantity: 1 CPT4 Code Description: 57846962 11042 - DEB SUBQ TISSUE 20 SQ CM/< ICD-10 Diagnosis Description L97.822 Non-pressure chronic ulcer of other part of left lower leg with Modifier: fat layer expos Quantity: 1 ed Physician Procedures CPT4 Code Description: 9528413 24401 - WC PHYS LEVEL 4 - EST PT ICD-10 Diagnosis Description I89.0 Lymphedema, not elsewhere classified L97.822 Non-pressure chronic ulcer of other part of left lower leg with Modifier: 25 fat layer expos Quantity: 1 ed CPT4 Code Description: 0272536 11042 - WC PHYS SUBQ TISS 20 SQ CM ICD-10 Diagnosis Description L97.822 Non-pressure chronic ulcer of other part of left lower leg with Modifier: fat layer expos Quantity: 1 ed Electronic Signature(s) Signed: 11/10/2017 4:51:29 PM By: Alejandro Mulling Signed: 11/10/2017 5:56:33 PM By: Lenda Kelp PA-C Previous Signature: 11/10/2017 1:09:54 PM Version By: Lenda Kelp PA-C Entered By: Alejandro Mulling on 11/10/2017 16:51:29

## 2017-12-02 NOTE — Progress Notes (Signed)
[place], [place] ([id number]) Visit Report for 11/10/2017 Abuse/Suicide Risk Screen Details Patient Name: [name], [name]. Date of Service: 11/10/2017 8:00 AM Medical Record Number: [id number] Patient Account Number: [id number] Date of Birth/Sex: 04/15/1930 (84 y.o. F) Treating RN: Phillis Haggis Primary Care [name]: [name] Other Clinician: Referring [name]: [name] Treating [name]/Extender: STONE III, Gordon Weeks in Treatment: 0 Abuse/Suicide Risk Screen Items Answer ABUSE/SUICIDE RISK SCREEN: Has anyone close to you tried to hurt or harm you recentlyo No Do you feel uncomfortable with anyone in your familyo No Has anyone forced you do things that you didnot want to doo No Do you have any thoughts of harming yourselfo No Patient displays signs or symptoms of abuse and/or neglect. No Electronic Signature(s) Signed: 11/10/2017 5:11:14 PM By: [name] Signed: 12/02/2017 7:45:54 AM By: [name] Entered By: [name] on 11/10/2017 08:36:07 [name] ([id number]) -------------------------------------------------------------------------------- Activities of Daily Living Details Patient Name: [name]. Date of Service: 11/10/2017 8:00 AM Medical Record Number: [id number] Patient Account Number: [id number] Date of Birth/Sex: [birth year] (84 y.o. F) Treating RN: Phillis Haggis Primary Care [name]: [name] Other Clinician: Referring Crescent Gotham: [name] Treating Kiarah Eckstein/Extender: STONE III, Gordon Weeks in Treatment: 0 Activities of Daily Living Items Answer Activities of Daily Living (Please select one for each item) Drive Automobile Not Able Take Medications Need Assistance Use Telephone Need Assistance Care for Appearance Completely Able Use Toilet Need Assistance Bath / Shower Not Able Dress Self Not Able Feed Self Need Assistance Walk Not Able Get In / Out Bed Not Able Housework Not Able Prepare Meals Not Able Handle  Money Need Assistance Shop for Self Not Able Electronic Signature(s) Signed: 11/10/2017 5:11:14 PM By: [name] Signed: 12/02/2017 7:45:54 AM By: [name] Entered By: [name] on 11/10/2017 08:37:08 [name] ([id number]) -------------------------------------------------------------------------------- Education Assessment Details Patient Name: [name]. Date of Service: 11/10/2017 8:00 AM Medical Record Number: [id number] Patient Account Number: [id number] Date of Birth/Sex: Feb 22, 1930 (84 y.o. F) Treating RN: Phillis Haggis Primary Care Azariah Latendresse: [name] Other Clinician: Referring Perlita Forbush: [name] Treating Cortez Steelman/Extender: Linwood Dibbles, Gordon Weeks in Treatment: 0 Learning Preferences/Education Level/Primary Language Learning Preference: Demonstration Highest Education Level: High School Preferred Language: English Cognitive Barrier Assessment/Beliefs Language Barrier: No Translator Needed: No Memory Deficit: Yes Emotional Barrier: No Physical Barrier Assessment Impaired Vision: No Impaired Hearing: No Decreased Hand dexterity: No Knowledge/Comprehension Assessment Knowledge Level: Medium Comprehension Level: Medium Ability to understand written Medium instructions: Ability to understand verbal High instructions: Motivation Assessment Anxiety Level: Calm Cooperation: Cooperative Education Importance: Acknowledges Need Interest in Health Problems: Asks Questions Perception: Coherent Willingness to Engage in Self- Medium Management Activities: Readiness to Engage in Self- Medium Management Activities: Electronic Signature(s) Signed: 11/10/2017 5:11:14 PM By: [name] Signed: 12/02/2017 7:45:54 AM By: [name] Entered By: [name] on 11/10/2017 08:38:20 [name] ([id number]) -------------------------------------------------------------------------------- Fall Risk Assessment Details Patient  Name: [name]. Date of Service: 11/10/2017 8:00 AM Medical Record Number: [id number] Patient Account Number: [id number] Date of Birth/Sex: May 18, 1930 (83 y.o. F) Treating RN: Phillis Haggis Primary Care Arlita Buffkin: [name] Other Clinician: Referring Tyhir Schwan: [name] Treating Ja Ohman/Extender: Linwood Dibbles, Gordon Weeks in Treatment: 0 Fall Risk Assessment Items Have you had 2 or more falls in the last 12 monthso 0 No Have you had any fall that resulted in injury in the last 12 monthso 0 No FALL RISK ASSESSMENT: History of falling - immediate or within 3 months 0 No Secondary diagnosis 0 No Ambulatory  aid None/bed rest/wheelchair/nurse 0 Yes Crutches/cane/walker 0 No Furniture 0 No IV Access/Saline Lock 0 No Gait/Training Normal/bed rest/immobile 0 No Weak 0 No Impaired 0 No Mental Status Oriented to own ability 0 No Electronic Signature(s) Signed: 11/10/2017 5:11:14 PM By: [name] Signed: 12/02/2017 7:45:54 AM By: [name] Entered By: [name] on 11/10/2017 08:40:06 Kaley, Sanskriti J. ([id number]) -------------------------------------------------------------------------------- Foot Assessment Details Patient Name: [name]. Date of Service: 11/10/2017 8:00 AM Medical Record Number: [id number] Patient Account Number: [id number] Date of Birth/Sex: 11/20/1929 (84 y.o. F) Treating RN: Phillis Haggis Primary Care Imari Reen: [name] Other Clinician: Referring Lee Kuang: [name] Treating Dashanna Kinnamon/Extender: STONE III, Gordon Weeks in Treatment: 0 Foot Assessment Items Site Locations + = Sensation present, - = Sensation absent, C = Callus, U = Ulcer R = Redness, W = Warmth, M = Maceration, PU = Pre-ulcerative lesion F = Fissure, S = Swelling, D = Dryness Assessment Right: Left: Other Deformity: No No Prior Foot Ulcer: No No Prior Amputation: No No Charcot Joint: No No Ambulatory Status: Non-ambulatory Assistance Device:  Wheelchair Gait: Surveyor, mining) Signed: 11/10/2017 5:11:14 PM By: [name] Signed: 12/02/2017 7:45:54 AM By: [name] Entered By: [name] on 11/10/2017 08:43:57 Nevel, Ronasia J. ([id number]) -------------------------------------------------------------------------------- Nutrition Risk Assessment Details Patient Name: [place], Meliah J. Date of Service: 11/10/2017 8:00 AM Medical Record Number: [id number] Patient Account Number: [id number] Date of Birth/Sex: 09-29-1929 (84 y.o. F) Treating RN: Phillis Haggis Primary Care Jaimere Feutz: [name] Other Clinician: Referring Udell Mazzocco: [name] Treating Sanika Brosious/Extender: STONE III, Gordon Weeks in Treatment: 0 Height (in): 66 Weight (lbs): 155 Body Mass Index (BMI): 25 Nutrition Risk Assessment Items NUTRITION RISK SCREEN: I have an illness or condition that made me change the kind and/or amount of 0 No food I eat I eat fewer than two meals per day 0 No I eat few fruits and vegetables, or milk products 0 No I have three or more drinks of beer, liquor or wine almost every day 0 No I have tooth or mouth problems that make it hard for me to eat 2 Yes I don't always have enough money to buy the food I need 0 No I eat alone most of the time 0 No I take three or more different prescribed or over-the-counter drugs a day 1 Yes Without wanting to, I have lost or gained 10 pounds in the last six months 0 No I am not always physically able to shop, cook and/or feed myself 0 No Nutrition Protocols Good Risk Protocol Provide education on Moderate Risk Protocol 0 nutrition Electronic Signature(s) Signed: 11/10/2017 5:11:14 PM By: [name] Signed: 12/02/2017 7:45:54 AM By: [name] Entered By: [name] on 11/10/2017 08:42:01

## 2017-12-07 ENCOUNTER — Encounter: Payer: Medicare Other | Admitting: Occupational Therapy

## 2017-12-09 ENCOUNTER — Encounter: Payer: Medicare Other | Admitting: Occupational Therapy

## 2017-12-14 ENCOUNTER — Ambulatory Visit (INDEPENDENT_AMBULATORY_CARE_PROVIDER_SITE_OTHER): Payer: Medicare Other | Admitting: Podiatry

## 2017-12-14 ENCOUNTER — Encounter: Payer: Self-pay | Admitting: Podiatry

## 2017-12-14 DIAGNOSIS — B351 Tinea unguium: Secondary | ICD-10-CM

## 2017-12-14 DIAGNOSIS — M79676 Pain in unspecified toe(s): Secondary | ICD-10-CM

## 2017-12-14 DIAGNOSIS — D689 Coagulation defect, unspecified: Secondary | ICD-10-CM

## 2017-12-14 NOTE — Progress Notes (Signed)
Complaint:  Visit Type: Patient returns to my office for continued preventative foot care services. Complaint: Patient states" my nails have beome thick and long.   She is unable to self treat.  Patient is wearing unna boots on both legs.  She presents for evaluation and treatment. Patient is accompanied by caregiver.  Podiatric Exam: Vascular: dorsalis pedis and posterior tibial pulses are palpable bilateral. Capillary return is immediate. Temperature gradient is WNL. Skin turgor WNL  Sensorium: Normal Semmes Weinstein monofilament test. Normal tactile sensation bilaterally. Nail Exam: Thick disfigured discolored nails from hallux to fifth toenail  B/l. Ulcer Exam: There is no evidence of ulcer or pre-ulcerative changes or infection. Orthopedic Exam: Muscle tone and strength are WNL. No limitations in general ROM. No crepitus or effusions noted. Foot type and digits show no abnormalities. Bony prominences are unremarkable. Skin: No Porokeratosis. No infection or ulcers  Diagnosis:  Onychomycosis  B/L.  Treatment & Plan Procedures and Treatment: Consent by patient was obtained for treatment procedures. The patient understood the discussion of treatment and procedures well. All questions were answered thoroughly reviewed. Debridement of mycotic and hypertrophic toenails, 1 through 5 bilateral and clearing of subungual debris. No ulceration, no infection noted.  Return Visit-Office Procedure: Patient instructed to return to the office for a follow up visit 12 weeks  for continued preventative foot care services.      Helane GuntherGregory Sam Overbeck DPM

## 2017-12-18 ENCOUNTER — Ambulatory Visit: Payer: Medicare Other | Admitting: Physician Assistant

## 2018-01-28 IMAGING — CR DG KNEE COMPLETE 4+V*L*
4 series · 4 of 4 positions shown · non-contrast
Comparison: 04/11/2012

CLINICAL DATA: Patient fell forward while picking up something,
landing on her left hip. Patient c/o left hip pain. Ems denies
shortening and rotation. Denies LOC

EXAM:
LEFT KNEE - COMPLETE 4+ VIEW

[knee ap]
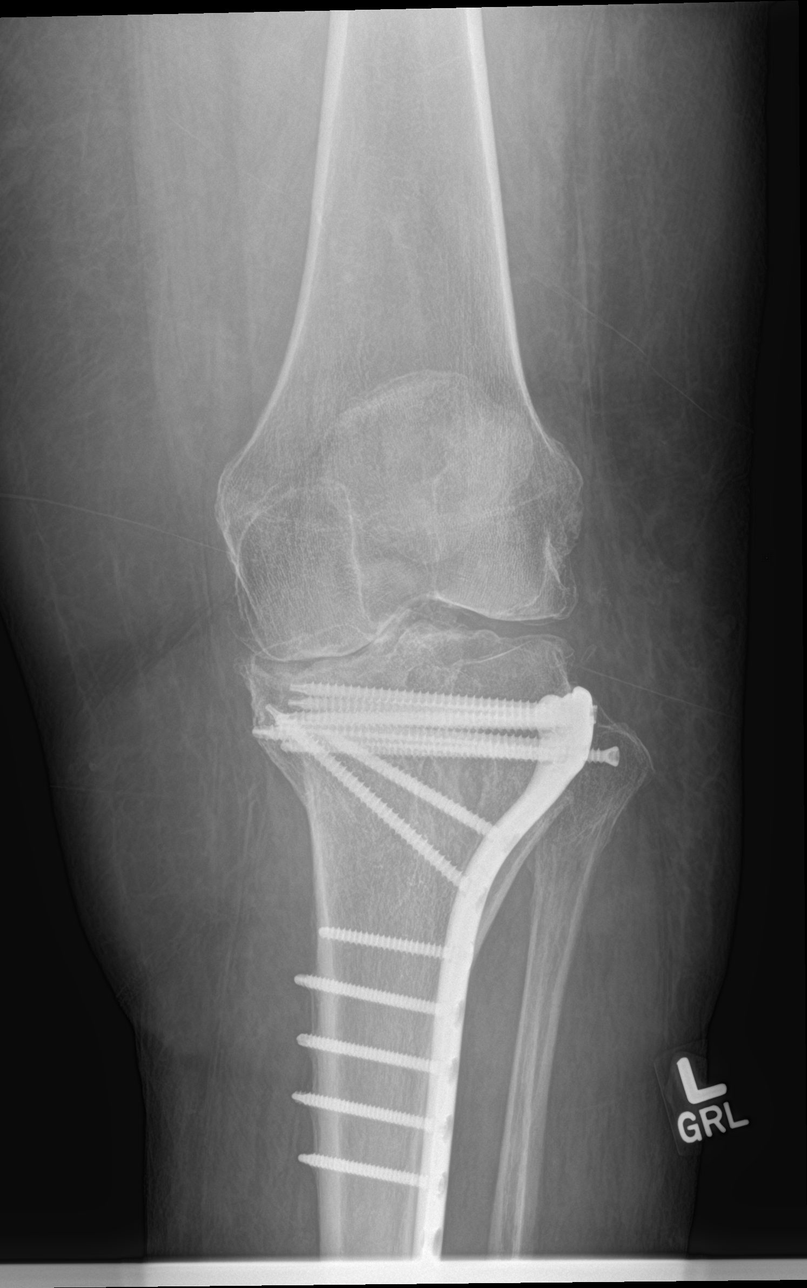

[knee obl (1 of 2)]
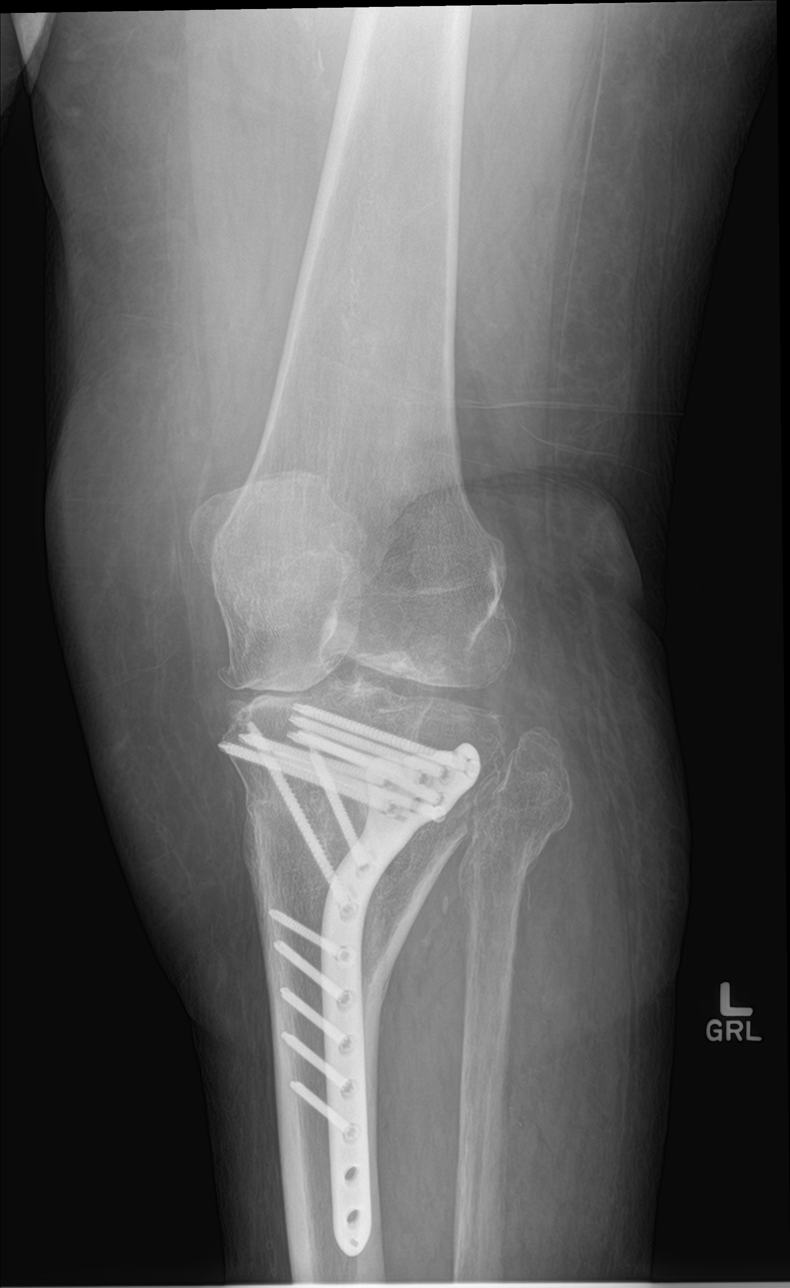

[knee obl (2 of 2)]
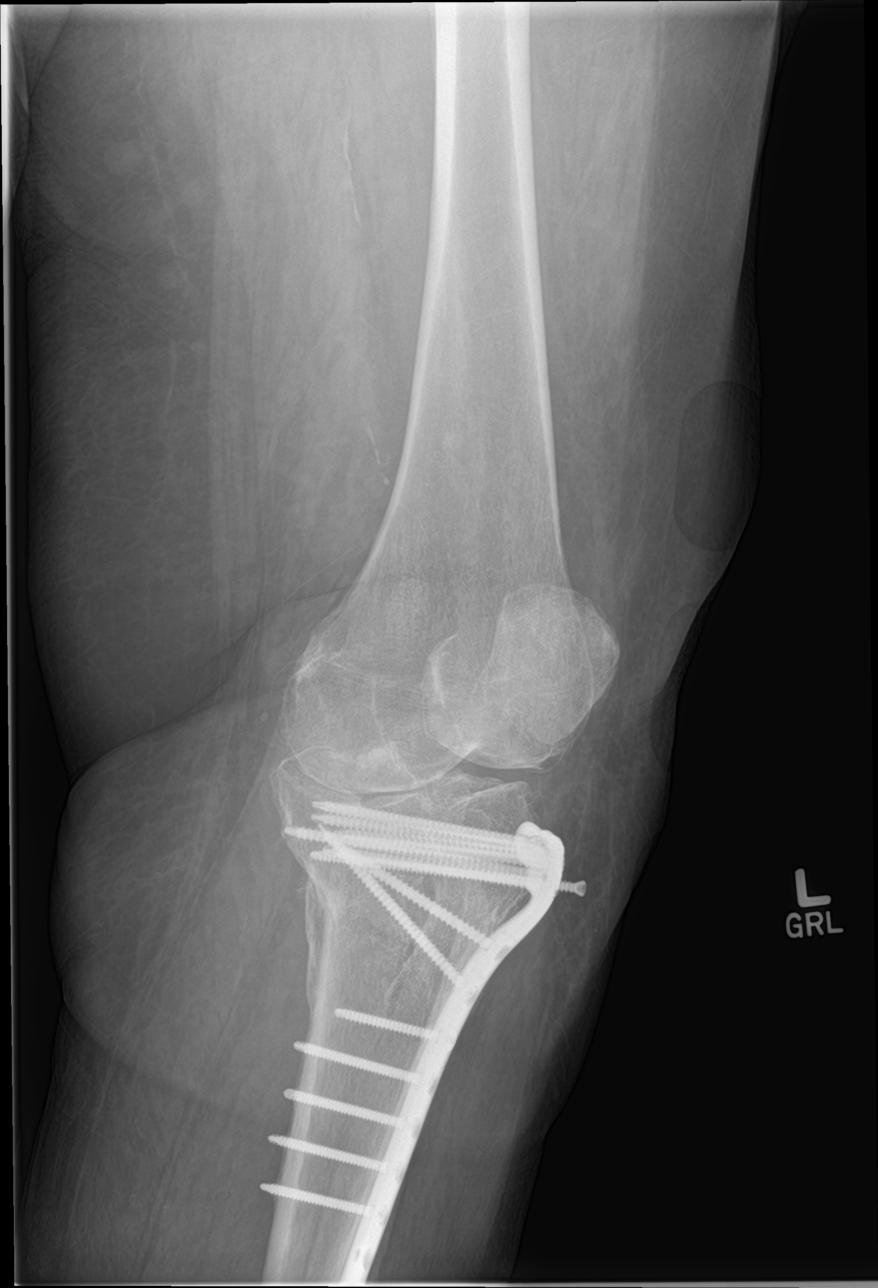

[knee lat]
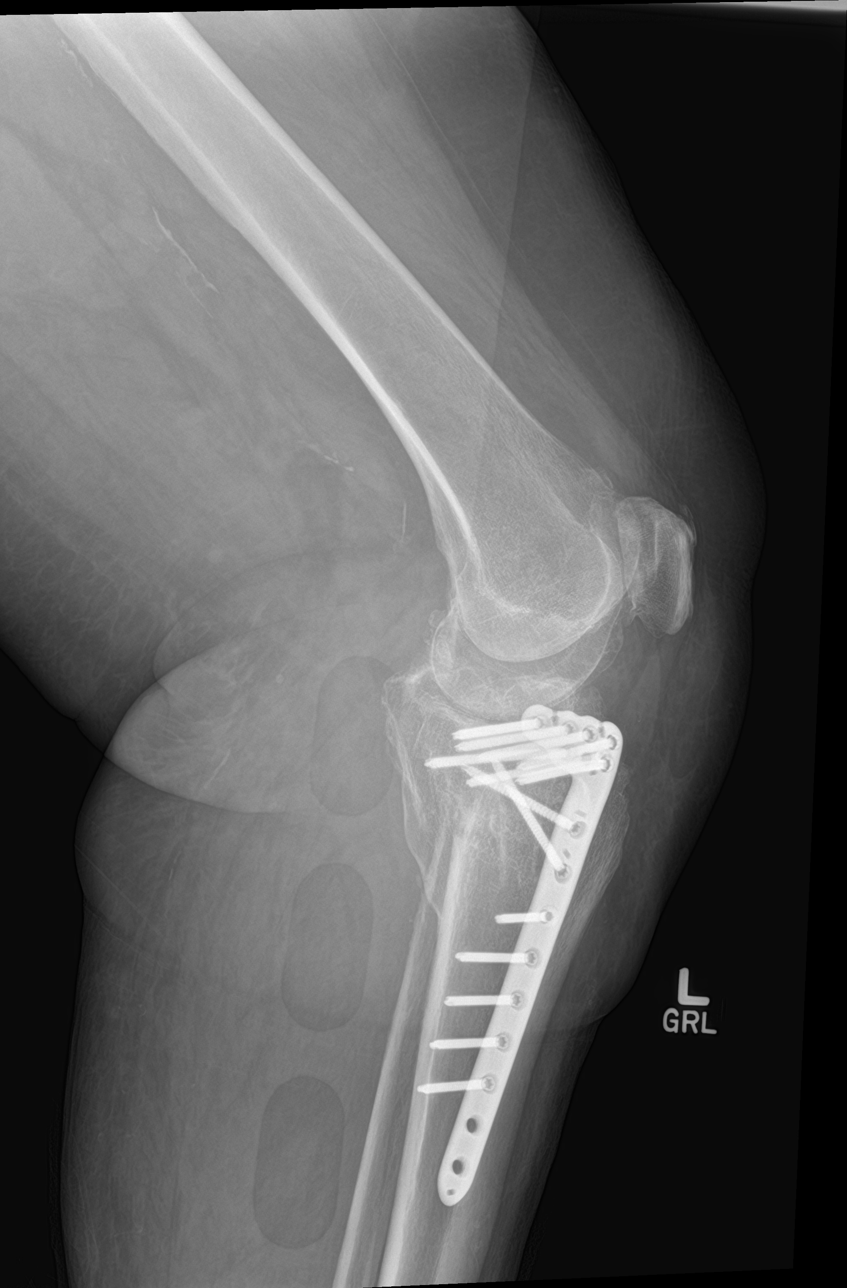

[4 of 4 positions shown; findings below may reference images not displayed]

FINDINGS: No acute fracture.

There has been a previous ORIF of a comminuted proximal tibial
fracture with a lateral fixation plate and multiple screws. The
orthopedic hardware appears well-seated and stable from the prior
exam. There is residual fracture deformity, but the fracture has
healed.

There is moderate medial compartment joint space narrowing with
milder patellofemoral and lateral compartment narrowing. Marginal
osteophytes project from all 3 compartments.

No bone lesions.  Bones are demineralized.

No joint effusion.

There are scattered posterior vascular calcifications.
IMPRESSION: 1. No acute fracture or dislocation.
2. Status post ORIF of a comminuted proximal tibial fracture.
Orthopedic hardware is well-seated with no evidence of loosening.

## 2018-01-28 IMAGING — CT CT L SPINE W/O CM
3 of 4 series · 13 of 33 positions shown, 16 images · non-contrast
Comparison: 08/01/2016 lumbar radiographs

CLINICAL DATA: 86 y/o F; age-indeterminate compression deformities
of thoracic and lumbar spine.

EXAM:
CT LUMBAR SPINE WITHOUT CONTRAST
TECHNIQUE: Multidetector CT imaging of the lumbar spine was performed without
intravenous contrast administration. Multiplanar CT image
reconstructions were also generated.

[Series 4: l spine soft · axial · 0.32mm/px · z∈[-876,-720]mm · 5 of 114 slices shown, 7 images]
[im 18/114  soft-tissue]
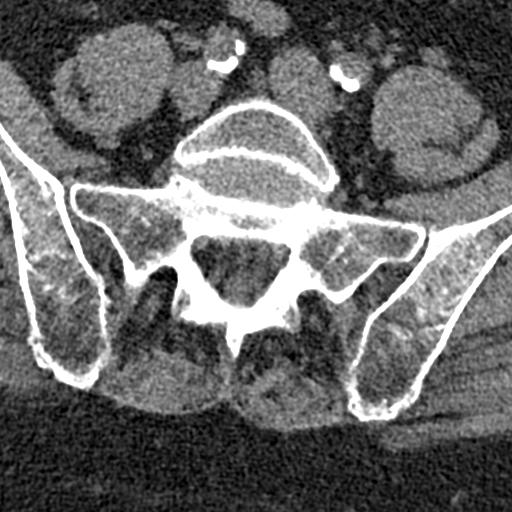
[im 18/114  bone]
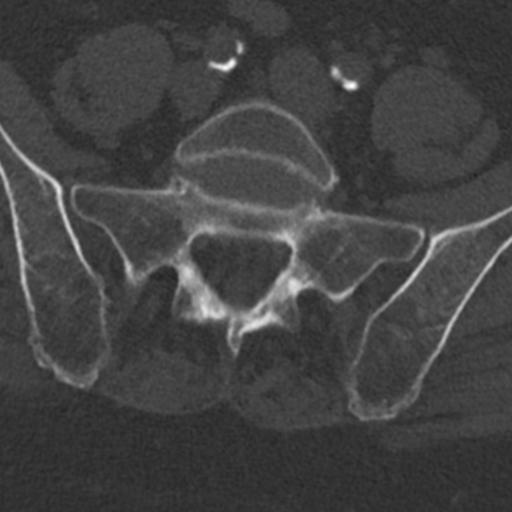
[im 35/114  bone]
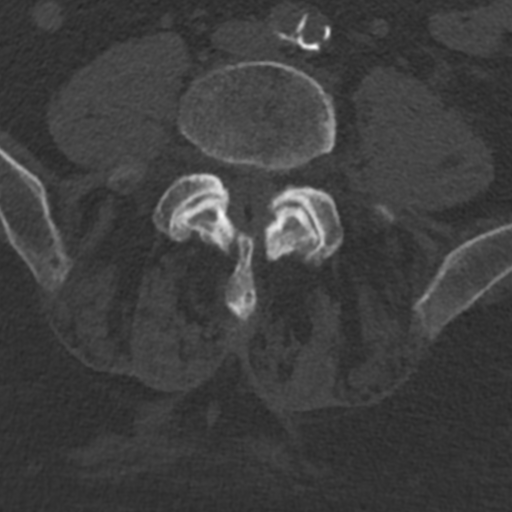
[im 61/114  bone]
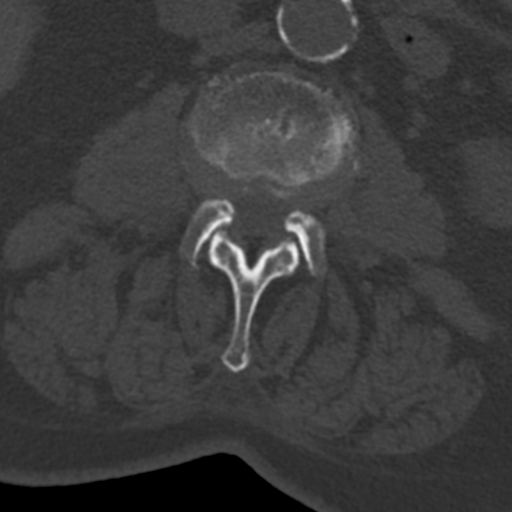
[im 79/114  bone]
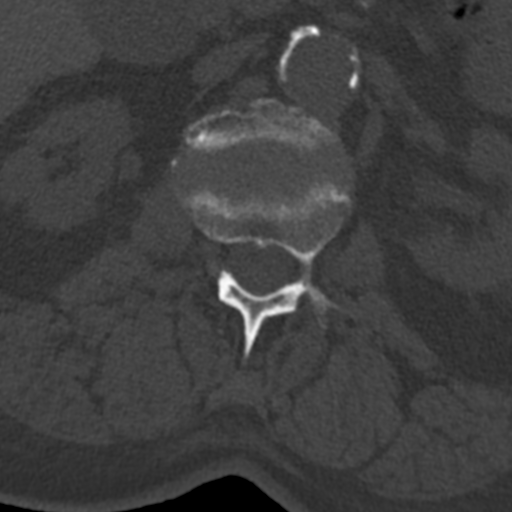
[im 96/114  soft-tissue]
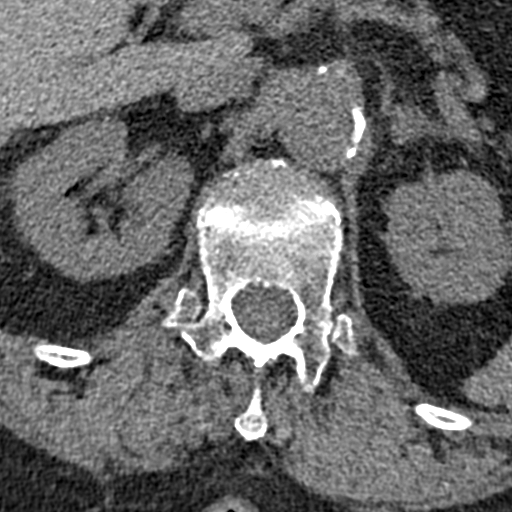
[im 96/114  bone]
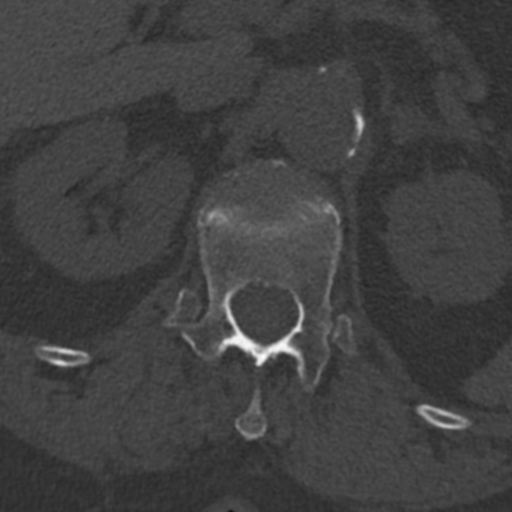

[Series 5: sagittal bone · sagittal · 0.34mm/px · 5 of 69 slices shown, 6 images]
[im 23/69  bone]
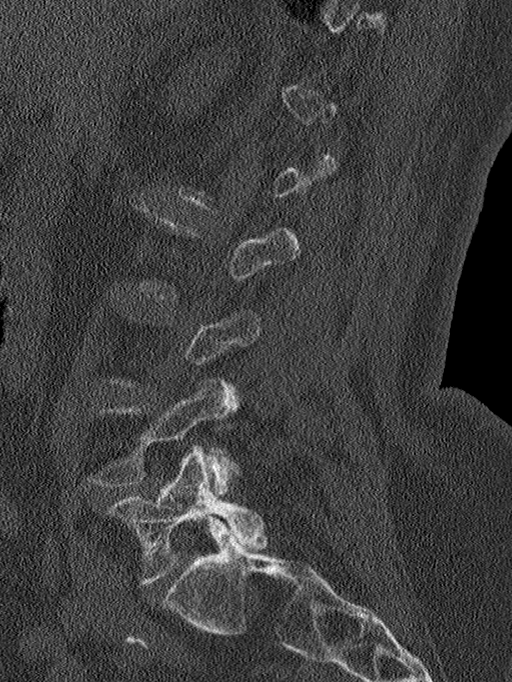
[im 29/69  bone]
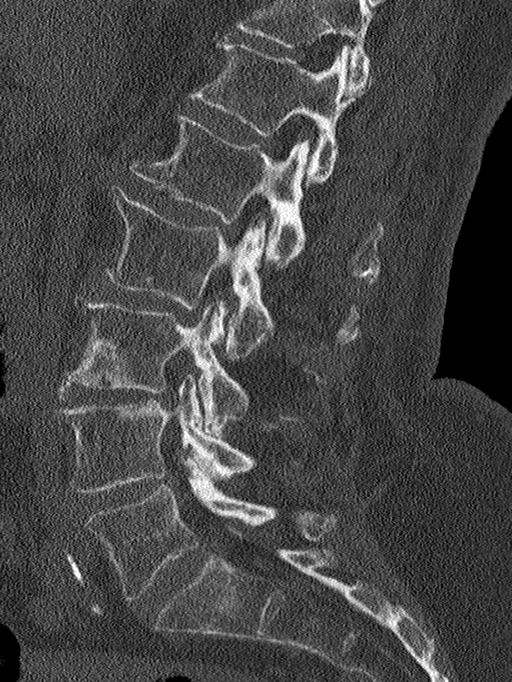
[im 35/69  soft-tissue]
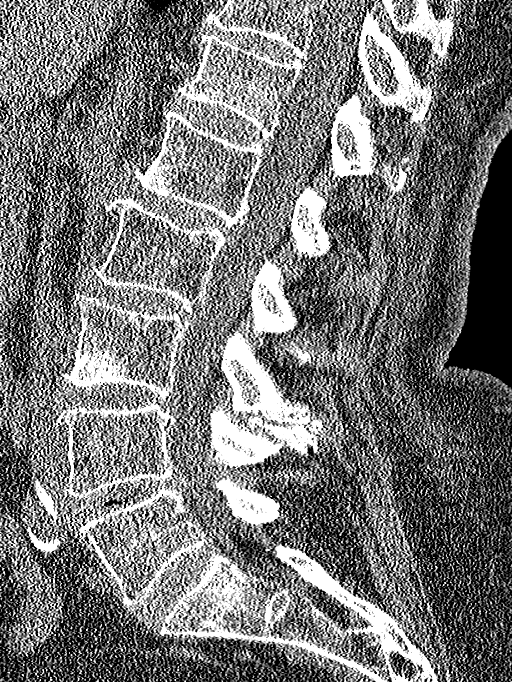
[im 35/69  bone]
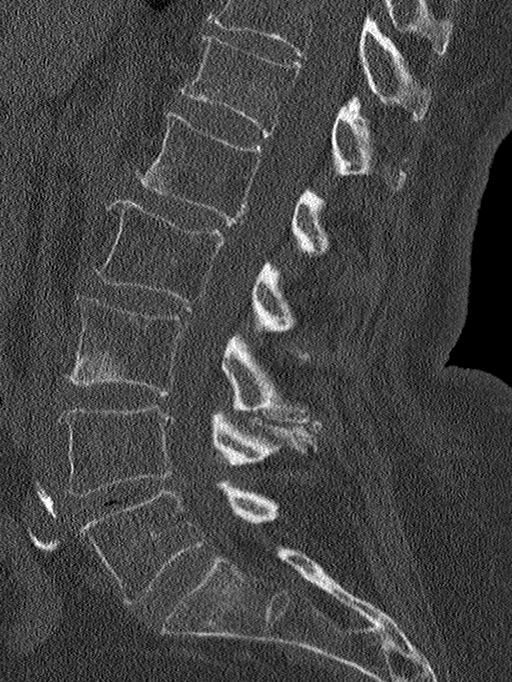
[im 40/69  bone]
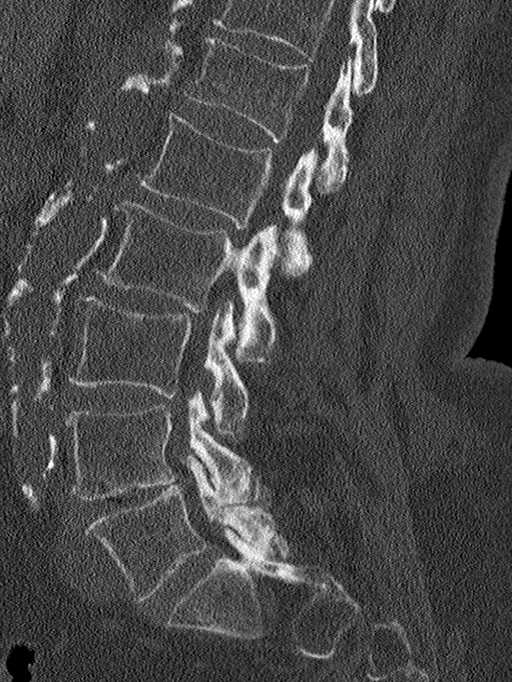
[im 46/69  bone]
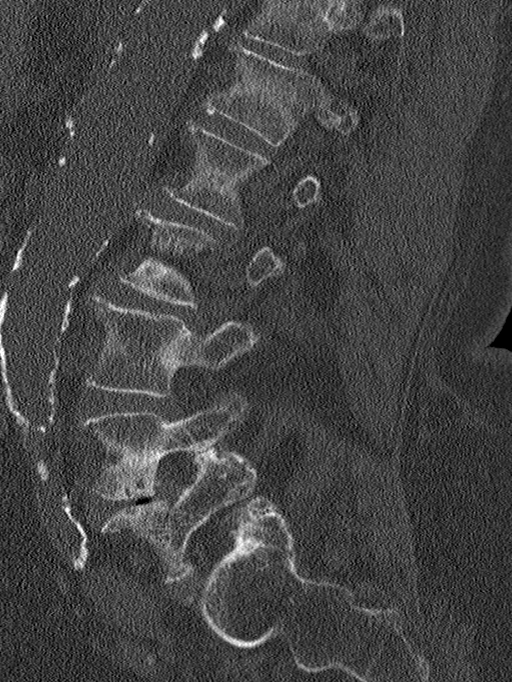

[Series 6: coronal bone · coronal · 0.34mm/px · 3 of 72 slices shown]
[im 18/72  bone]
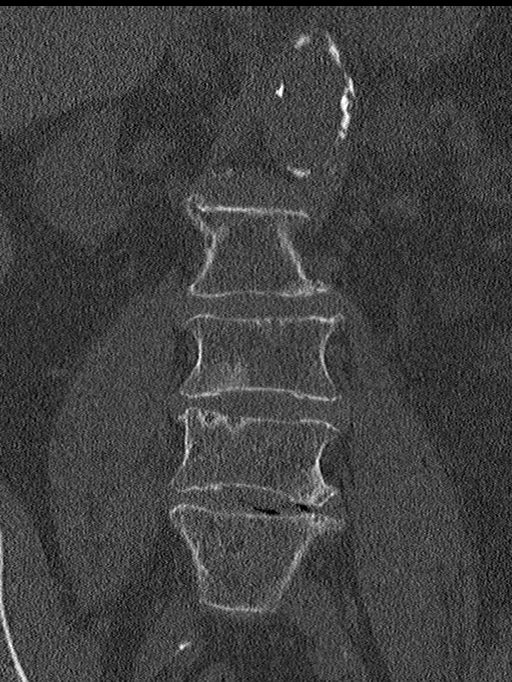
[im 36/72  bone]
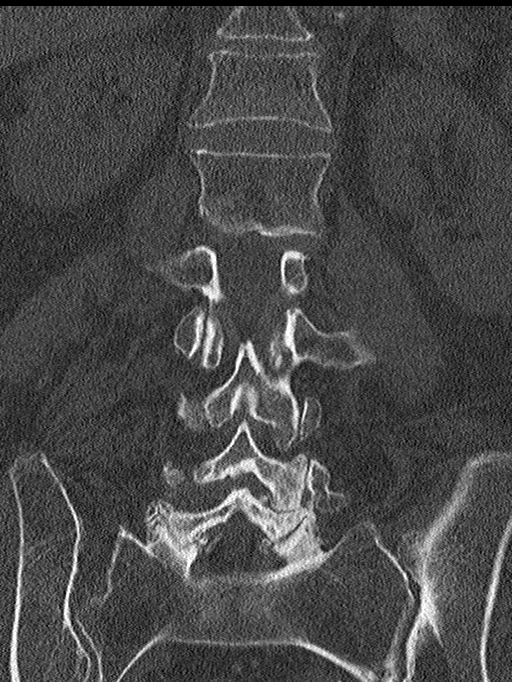
[im 54/72  bone]
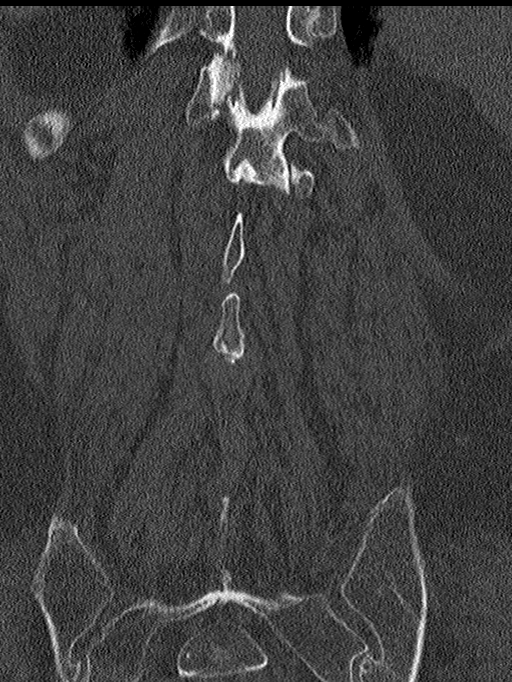

[13 of 33 positions shown; findings below may reference images not displayed]

FINDINGS: Segmentation: 5 lumbar type vertebrae.

Alignment: Normal.

Vertebrae: Minimal loss of height of T12 vertebral body with chronic
appearance. No significant loss of height of the lumbar vertebral
bodies. Suspected upper lumbar compression deformities on prior
radiographs is probably due projectional artifact from patient
positioning.

Paraspinal and other soft tissues: Severe calcific atherosclerosis
of the abdominal aorta with focal ectasia up to 28 mm.

Disc levels: Multilevel discogenic degenerative changes with
marginal osteophytes and mild loss of disc space height greatest at
L4-5. Facet arthrosis pronounced at L3 through S1. Articulation of
the L2 through S1 spinous processes with productive changes
compatible with Baastrup's disease. Disc, facet, and ligamentum
flavum disease at multiple levels resulting in canal stenosis
greatest at L3-4 where it is probably moderate.
IMPRESSION: 1. Minimal loss of height of T12 vertebral body with chronic
appearance. No significant loss of height of the lumbar vertebral
bodies. Suspected upper lumbar compression deformities on prior
radiographs is probably due projectional artifact from patient
positioning.
2. Moderate lumbar spondylosis with discogenic and facet
degenerative changes as well as lower lumbar Baastrup's disease.
3. Aortic atherosclerosis. Ectatic aortic measuring 28 mm. Ectatic
abdominal aorta at risk for aneurysm development. Recommend followup
by ultrasound in 5 years. This recommendation follows ACR consensus
guidelines: White Paper of the ACR Incidental Findings Committee II

By: Danyelle Kanu M.D.

## 2018-03-18 ENCOUNTER — Ambulatory Visit: Payer: Medicare Other | Admitting: Podiatry

## 2018-03-22 ENCOUNTER — Encounter (INDEPENDENT_AMBULATORY_CARE_PROVIDER_SITE_OTHER): Payer: Self-pay

## 2018-03-22 ENCOUNTER — Encounter (INDEPENDENT_AMBULATORY_CARE_PROVIDER_SITE_OTHER): Payer: Self-pay | Admitting: Vascular Surgery

## 2018-03-22 ENCOUNTER — Ambulatory Visit (INDEPENDENT_AMBULATORY_CARE_PROVIDER_SITE_OTHER): Payer: Medicare Other | Admitting: Nurse Practitioner

## 2018-03-22 VITALS — Resp 17 | Ht 65.0 in | Wt 215.0 lb

## 2018-03-22 DIAGNOSIS — I890 Lymphedema, not elsewhere classified: Secondary | ICD-10-CM | POA: Diagnosis not present

## 2018-03-22 DIAGNOSIS — I10 Essential (primary) hypertension: Secondary | ICD-10-CM | POA: Diagnosis not present

## 2018-03-22 DIAGNOSIS — E782 Mixed hyperlipidemia: Secondary | ICD-10-CM

## 2018-03-26 ENCOUNTER — Encounter (INDEPENDENT_AMBULATORY_CARE_PROVIDER_SITE_OTHER): Payer: Self-pay | Admitting: Nurse Practitioner

## 2018-03-26 NOTE — Progress Notes (Signed)
Subjective:    Patient ID: [name] Coombsrma J Butrick, female    DOB: 10/11/1929, 84 y.o.   MRN: [id number] Chief Complaint  Patient presents with  . New Patient (Initial Visit)    Lymphedema    HPI  [name] is a 84 y.o. female  seen for evaluation of leg swelling.  The patient has a history of lymphedema and she is currently utilizing Unna wraps as well as a lymphedema pump.  She and her caretaker endorse utilizing elevation whenever the patient is not outside of the home.  The patient is currently wheelchair-bound.  There is no history of ulcerations associated with the swelling.   The patient denies any recent changes in their medications.  The patient has no had any past angiography, interventions or vascular surgery.  The patient has had a DVT previously for which she is on Coumadin therapy.  There is no prior history of phlebitis. .  There is no history of radiation treatment to the groin or pelvis No history of malignancies. No history of trauma or groin or pelvic surgery. No history of foreign travel or parasitic infections area    Review of Systems: Negative Unless Checked Constitutional: [] Weight loss  [] Fever  [] Chills Cardiac: [] Chest pain   [] Chest pressure   [] Palpitations   [] Shortness of breath when laying flat   [] Shortness of breath with exertion. Vascular:  [] Pain in legs with walking   [] Pain in legs with standing  [] History of DVT   [] Phlebitis   [x] Swelling in legs   [x] Varicose veins   [] Non-healing ulcers Pulmonary:   [x] Uses home oxygen   [] Productive cough   [] Hemoptysis   [] Wheeze  [x] COPD   [] Asthma Neurologic:  [] Dizziness   [] Seizures   [] History of stroke   [] History of TIA  [] Aphasia   [] Vissual changes   [] Weakness or numbness in arm   [x] Weakness or numbness in leg Musculoskeletal:   [] Joint swelling   [] Joint pain   [] Low back pain Hematologic:  [] Easy bruising  [] Easy bleeding   [] Hypercoagulable state   [] Anemic Gastrointestinal:  [] Diarrhea    [] Vomiting  [] Gastroesophageal reflux/heartburn   [] Difficulty swallowing. Genitourinary:  [] Chronic kidney disease   [] Difficult urination  [] Frequent urination   [] Blood in urine Skin:  [] Rashes   [] Ulcers  Psychological:  [] History of anxiety   []  History of major depression.     Objective:   Physical Exam  Resp 17   Ht 5\' 5"  (1.651 m)   Wt 215 lb (97.5 kg)   BMI 35.78 kg/m    Past Medical History:  Diagnosis Date  . COPD (chronic obstructive pulmonary disease) (HCC)   . DVT (deep venous thrombosis) (HCC)   . Hypertension   . Pneumothorax      Gen: WD/WN, NAD Head: [place]/AT, No temporalis wasting.  Ear/Nose/Throat: Hearing grossly intact, nares w/o erythema or drainage Eyes: PER, EOMI, sclera nonicteric.  Neck: Supple, no masses.  No JVD.  Pulmonary:  Good air movement, no use of accessory muscles.  Cardiac: RRR Vascular:  2+ pitting edema bilaterally.  Small scattered varicosities. Vessel Right Left  Radial    Brachial    Femoral    Popliteal    PT    DP    Carotid    Gastrointestinal: soft, non-distended. No guarding/no peritoneal signs.  Musculoskeletal: M/S 5/5 throughout.  No deformity or atrophy.  Neurologic: Pain and light touch intact in extremities.  Symmetrical.  Speech is fluent. Motor exam as listed above. Psychiatric:  Judgment intact, Mood & affect appropriate for pt's clinical situation. Dermatologic: No Venous rashes. No Ulcers Noted.  No changes consistent with cellulitis. Lymph : No Cervical lymphadenopathy, no lichenification or skin changes of chronic lymphedema.   Social History   Socioeconomic History  . Marital status: Married    Spouse name: Not on file  . Number of children: Not on file  . Years of education: Not on file  . Highest education level: Not on file  Occupational History  . Not on file  Social Needs  . Financial resource strain: Not on file  . Food insecurity:    Worry: Not on file    Inability: Not on file  .  Transportation needs:    Medical: Not on file    Non-medical: Not on file  Tobacco Use  . Smoking status: Former Games developer  . Smokeless tobacco: Never Used  Substance and Sexual Activity  . Alcohol use: No  . Drug use: No  . Sexual activity: Not on file  Lifestyle  . Physical activity:    Days per week: Not on file    Minutes per session: Not on file  . Stress: Not on file  Relationships  . Social connections:    Talks on phone: Not on file    Gets together: Not on file    Attends religious service: Not on file    Active member of club or organization: Not on file    Attends meetings of clubs or organizations: Not on file    Relationship status: Not on file  . Intimate partner violence:    Fear of current or ex partner: Not on file    Emotionally abused: Not on file    Physically abused: Not on file    Forced sexual activity: Not on file  Other Topics Concern  . Not on file  Social History Narrative  . Not on file    Past Surgical History:  Procedure Laterality Date  . ABDOMINAL HYSTERECTOMY    . colon cancer    . HEMIARTHROPLASTY SHOULDER FRACTURE Left   . TIBIA FRACTURE SURGERY Left     Family History  Problem Relation Age of Onset  . Diabetes Maternal Grandmother     Allergies  Allergen Reactions  . Sulfa Antibiotics Other (See Comments)    dizziness dizziness  . Hydralazine Hcl Other (See Comments)    Other reaction(s): Unknown  Other reaction(s): Unknown  . Nsaids     Pt w/ CKD3 Pt w/ CKD3  . Tolmetin     Pt w/ CKD3  . Aspirin Nausea Only and Other (See Comments)    Abdominal pain       Assessment & Plan:   1. Lymphedema Patient was previously diagnosed with lymphedema.  She engages in conservative therapy currently.  At this time she is in [employer], however when she is not she is utilizing medical grade 1 compression stockings 20 to 30 mmHg.  The patient elevates her lower extremities as often as possible.  The patient also utilizes her  lymphedema pump, generally every evening for 1 to 2 hours.  The patient and her caregiver have a great understanding of current conservative therapy methods.  We will bring her back for a lower extremity venous reflux to determine if there are any further interventions that we may be able to do to assist in the control of her fluid status.   - VAS [place] LOWER EXTREMITY VENOUS REFLUX; Future  2. Mixed hyperlipidemia Continue  statin as ordered and reviewed, no changes at this time   3. Hypertension, unspecified type Continue antihypertensive medications as already ordered, these medications have been reviewed and there are no changes at this time.    Current Outpatient Medications on File Prior to Visit  Medication Sig Dispense Refill  . acetaminophen (TYLENOL) 650 MG CR tablet Take 650 mg by mouth 2 (two) times daily.    Marland Kitchen albuterol (PROVENTIL HFA;VENTOLIN HFA) 108 (90 Base) MCG/ACT inhaler Inhale into the lungs.    Marland Kitchen albuterol (PROVENTIL) (2.5 MG/3ML) 0.083% nebulizer solution Inhale into the lungs.    . ALPRAZolam (XANAX) 0.25 MG tablet Take 1 tablet (0.25 mg total) by mouth 3 (three) times daily as needed for anxiety. 30 tablet 0  . atorvastatin (LIPITOR) 10 MG tablet Take by mouth.    . benzonatate (TESSALON) 100 MG capsule TAKE 2 CAPSULES BY MOUTH THREE TIMES DAILY AS NEEDED FOR COUGH  1  . donepezil (ARICEPT) 5 MG tablet Take by mouth.    Marland Kitchen guaiFENesin (MUCINEX) 600 MG 12 hr tablet Take 1 tablet (600 mg total) by mouth 2 (two) times daily. 10 tablet 0  . ipratropium (ATROVENT) 0.02 % nebulizer solution Inhale into the lungs.    . lidocaine (LIDODERM) 5 % Place 1 patch onto the skin daily. Remove & Discard patch within 12 hours or as directed by MD 5 patch 0  . meclizine (ANTIVERT) 12.5 MG tablet Take by mouth.    . memantine (NAMENDA) 10 MG tablet     . Multiple Vitamins-Minerals (PRESERVISION AREDS 2+MULTI VIT PO) Take 1 tablet by mouth 2 (two) times daily.    . predniSONE  (DELTASONE) 20 MG tablet Take 2 tablets (40 mg total) by mouth daily with breakfast. 8 tablet 0  . rivaroxaban (XARELTO) 20 MG TABS tablet Take by mouth.    . senna (SENOKOT) 8.6 MG TABS tablet Take 1 tablet (8.6 mg total) by mouth at bedtime. 30 each 0  . triamcinolone cream (KENALOG) 0.1 % APPLY CREAM TOPICALLY TWICE DAILY  1  . warfarin (COUMADIN) 4 MG tablet Take 1 tablet (4 mg total) by mouth daily at 6 PM. (Patient taking differently: Take 5 mg by mouth daily at 6 PM. ) 20 tablet 0  . amLODipine (NORVASC) 2.5 MG tablet Take 2.5 mg by mouth daily.    . furosemide (LASIX) 20 MG tablet Take 20 mg by mouth daily.     No current facility-administered medications on file prior to visit.     There are no Patient Instructions on file for this visit. No follow-ups on file.   Georgiana Spinner, NP

## 2018-03-31 ENCOUNTER — Ambulatory Visit (INDEPENDENT_AMBULATORY_CARE_PROVIDER_SITE_OTHER): Payer: Medicare Other

## 2018-03-31 ENCOUNTER — Ambulatory Visit (INDEPENDENT_AMBULATORY_CARE_PROVIDER_SITE_OTHER): Payer: Medicare Other | Admitting: Nurse Practitioner

## 2018-03-31 ENCOUNTER — Encounter (INDEPENDENT_AMBULATORY_CARE_PROVIDER_SITE_OTHER): Payer: Self-pay | Admitting: Nurse Practitioner

## 2018-03-31 VITALS — BP 130/62 | HR 76 | Resp 16 | Ht 65.0 in

## 2018-03-31 DIAGNOSIS — I890 Lymphedema, not elsewhere classified: Secondary | ICD-10-CM | POA: Diagnosis not present

## 2018-03-31 DIAGNOSIS — I10 Essential (primary) hypertension: Secondary | ICD-10-CM | POA: Diagnosis not present

## 2018-03-31 DIAGNOSIS — E782 Mixed hyperlipidemia: Secondary | ICD-10-CM

## 2018-03-31 NOTE — Progress Notes (Signed)
Subjective:    Patient ID: [name], female    DOB: [birth year], 84 y.o.   MRN: [id number] Chief Complaint  Patient presents with  . Follow-up    pt conv bil reflux ultrasound    HPI  [name] is a 84 y.o. female that returns to the office for followup evaluation regarding leg swelling.  The swelling has persisted and the pain associated with swelling continues. There have not been any interval development of a ulcerations or wounds.   Since the previous visit the patient has been wearing Unna wraps done twice a week by home health and has noted little if any improvement in the lymphedema. The patient has been using compression routinely morning until night.  The patient also states elevation during the day and exercise is being done too.  The patient is currently wheelchair-bound with little ambulation.  She states that she elevates her legs approximately 2 hours/day.  She also has a lymphedema pump which she utilizes more in the afternoon.  Today [name] is undergoing a venous reflux study.  The venous reflux study revealed evidence of bilateral endovenous laser ablation for the bilateral great saphenous veins.  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  She also has reflux present in the common femoral vein.  There is no evidence of DVT.  Study was somewhat limited due to body habitus.   Review of Systems   Review of Systems: Negative Unless Checked Constitutional: [] Weight loss  [] Fever  [] Chills Cardiac: [] Chest pain   ? Atrial Fibrillation  [] Palpitations   [] Shortness of breath when laying flat   [] Shortness of breath with exertion. Vascular:  [] Pain in legs with walking   [] Pain in legs with standing  [] History of DVT   [] Phlebitis   [x] Swelling in legs   [] Varicose veins   [] Non-healing ulcers Pulmonary:   [] Uses home oxygen   [] Productive cough   [] Hemoptysis   [] Wheeze  [] COPD   [] Asthma Neurologic:  [] Dizziness   [] Seizures   [] History of stroke    [] History of TIA  [] Aphasia   [] Vissual changes   [] Weakness or numbness in arm   [x] Weakness or numbness in leg Musculoskeletal:   [] Joint swelling   [] Joint pain   [x] Low back pain  ? History of Knee Replacement Hematologic:  [] Easy bruising  [] Easy bleeding   [] Hypercoagulable state   [] Anemic Gastrointestinal:  [] Diarrhea   [] Vomiting  [] Gastroesophageal reflux/heartburn   [] Difficulty swallowing. Genitourinary:  [] Chronic kidney disease   [] Difficult urination  [] Anuric   [] Blood in urine Skin:  [] Rashes   [] Ulcers  Psychological:  [x] History of anxiety   []  History of major depression  ? Memory Difficulties     Objective:   Physical Exam  BP 130/62 (BP Location: Left Arm)   Pulse 76   Resp 16   Ht 5\' 5"  (1.651 m)   BMI 35.78 kg/m   Past Medical History:  Diagnosis Date  . COPD (chronic obstructive pulmonary disease) (HCC)   . DVT (deep venous thrombosis) (HCC)   . Hypertension   . Pneumothorax      Gen: WD/WN, NAD Head: [place]/AT, No temporalis wasting.  Ear/Nose/Throat: Hearing grossly intact, nares w/o erythema or drainage Eyes: PER, EOMI, sclera nonicteric.  Neck: Supple, no masses.  No JVD.  Pulmonary:  Good air movement, no use of accessory muscles.  Cardiac: RRR Vascular:  Unna wraps in place bilaterally.  2+ nonpitting edema bilaterally Vessel Right Left  Radial  Palpable Palpable   Gastrointestinal: soft, non-distended. No guarding/no peritoneal signs.  Musculoskeletal: Patient wheelchair-bound does not walk frequently. No deformity or atrophy.  Neurologic: Pain and light touch intact in extremities.  Symmetrical.  Speech is fluent. Motor exam as listed above. Psychiatric: Judgment intact, Mood & affect appropriate for pt's clinical situation. Dermatologic: No Venous rashes. No Ulcers Noted.  No changes consistent with cellulitis. Lymph : No Cervical lymphadenopathy, no lichenification or skin changes of chronic lymphedema.   Social History   Socioeconomic  History  . Marital status: Married    Spouse name: Not on file  . Number of children: Not on file  . Years of education: Not on file  . Highest education level: Not on file  Occupational History  . Not on file  Social Needs  . Financial resource strain: Not on file  . Food insecurity:    Worry: Not on file    Inability: Not on file  . Transportation needs:    Medical: Not on file    Non-medical: Not on file  Tobacco Use  . Smoking status: Former Games developer  . Smokeless tobacco: Never Used  Substance and Sexual Activity  . Alcohol use: No  . Drug use: No  . Sexual activity: Not on file  Lifestyle  . Physical activity:    Days per week: Not on file    Minutes per session: Not on file  . Stress: Not on file  Relationships  . Social connections:    Talks on phone: Not on file    Gets together: Not on file    Attends religious service: Not on file    Active member of club or organization: Not on file    Attends meetings of clubs or organizations: Not on file    Relationship status: Not on file  . Intimate partner violence:    Fear of current or ex partner: Not on file    Emotionally abused: Not on file    Physically abused: Not on file    Forced sexual activity: Not on file  Other Topics Concern  . Not on file  Social History Narrative  . Not on file    Past Surgical History:  Procedure Laterality Date  . ABDOMINAL HYSTERECTOMY    . colon cancer    . HEMIARTHROPLASTY SHOULDER FRACTURE Left   . TIBIA FRACTURE SURGERY Left     Family History  Problem Relation Age of Onset  . Diabetes Maternal Grandmother     Allergies  Allergen Reactions  . Sulfa Antibiotics Other (See Comments)    dizziness dizziness  . Hydralazine Hcl Other (See Comments)    Other reaction(s): Unknown  Other reaction(s): Unknown  . Nsaids     Pt w/ CKD3 Pt w/ CKD3  . Tolmetin     Pt w/ CKD3  . Aspirin Nausea Only and Other (See Comments)    Abdominal pain       Assessment & Plan:     1. Lymphedema Today [name] is undergoing a venous reflux study.  The venous reflux study revealed evidence of bilateral endovenous laser ablation for the bilateral great saphenous veins.  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  She also has reflux present in the common femoral vein.  There is no evidence of DVT.  Study was somewhat limited due to body habitus.    No surgery or intervention at this point in time.    I have reviewed my discussion with the  patient regarding lymphedema and why it  causes symptoms.  Patient will continue wearing graduated compression stockings class 1 (20-30 mmHg) on a daily basis a prescription was given. The patient is reminded to put the stockings on first thing in the morning and removing them in the evening. The patient is instructed specifically not to sleep in the stockings.   In addition, behavioral modification throughout the day will be continued.  This will include frequent elevation (such as in a recliner), use of over the counter pain medications as needed and exercise such as walking.  I have reviewed systemic causes for chronic edema such as liver, kidney and cardiac etiologies and there does not appear to be any significant changes in these organ systems over the past year.  The patient is under the impression that these organ systems are all stable and unchanged.    The patient will continue aggressive use of the  lymph pump.  This will continue to improve the edema control and prevent sequela such as ulcers and infections.   Recommended several behavioral modifications.  The patient states that she only elevates her legs approximately 2 hours throughout the day.  It stated that she should be elevating her legs in the recliner more than she is in her wheelchair.  The wheelchair allows for increased dependency which allows for increased swelling.  The patient also instructed to begin wearing medical grade 1 compression stockings  as soon as out of Unna wraps.  The patient should also be utilizing her lymphedema pump in the evening where the swelling is the worst, versus in the middle of the afternoon.  Patient was also instructed to do calf raises when sitting in her wheelchair to help with circulation as well.  Salt restriction was also advised.  The patient will follow-up in 6 months to determine how behavioral modifications have helped.   2. Mixed hyperlipidemia Continue statin as ordered and reviewed, no changes at this time   3. Hypertension, unspecified type Continue antihypertensive medications as already ordered, these medications have been reviewed and there are no changes at this time.    Current Outpatient Medications on File Prior to Visit  Medication Sig Dispense Refill  . acetaminophen (TYLENOL) 650 MG CR tablet Take 650 mg by mouth 2 (two) times daily.    Marland Kitchen. albuterol (PROVENTIL HFA;VENTOLIN HFA) 108 (90 Base) MCG/ACT inhaler Inhale into the lungs.    Marland Kitchen. albuterol (PROVENTIL) (2.5 MG/3ML) 0.083% nebulizer solution Inhale into the lungs.    . ALPRAZolam (XANAX) 0.25 MG tablet Take 1 tablet (0.25 mg total) by mouth 3 (three) times daily as needed for anxiety. 30 tablet 0  . atorvastatin (LIPITOR) 10 MG tablet Take by mouth.    . benzonatate (TESSALON) 100 MG capsule TAKE 2 CAPSULES BY MOUTH THREE TIMES DAILY AS NEEDED FOR COUGH  1  . donepezil (ARICEPT) 5 MG tablet Take by mouth.    Marland Kitchen. guaiFENesin (MUCINEX) 600 MG 12 hr tablet Take 1 tablet (600 mg total) by mouth 2 (two) times daily. 10 tablet 0  . ipratropium (ATROVENT) 0.02 % nebulizer solution Inhale into the lungs.    . lidocaine (LIDODERM) 5 % Place 1 patch onto the skin daily. Remove & Discard patch within 12 hours or as directed by MD 5 patch 0  . meclizine (ANTIVERT) 12.5 MG tablet Take by mouth.    . memantine (NAMENDA) 10 MG tablet     . Multiple Vitamins-Minerals (PRESERVISION AREDS 2+MULTI VIT PO) Take 1 tablet by  mouth 2 (two) times daily.     . predniSONE (DELTASONE) 20 MG tablet Take 2 tablets (40 mg total) by mouth daily with breakfast. 8 tablet 0  . rivaroxaban (XARELTO) 20 MG TABS tablet Take by mouth.    . senna (SENOKOT) 8.6 MG TABS tablet Take 1 tablet (8.6 mg total) by mouth at bedtime. 30 each 0  . triamcinolone cream (KENALOG) 0.1 % APPLY CREAM TOPICALLY TWICE DAILY  1  . warfarin (COUMADIN) 4 MG tablet Take 1 tablet (4 mg total) by mouth daily at 6 PM. (Patient taking differently: Take 5 mg by mouth daily at 6 PM. ) 20 tablet 0  . amLODipine (NORVASC) 2.5 MG tablet Take 2.5 mg by mouth daily.    . furosemide (LASIX) 20 MG tablet Take 20 mg by mouth daily.     No current facility-administered medications on file prior to visit.     There are no Patient Instructions on file for this visit. No follow-ups on file.   Georgiana Spinner, NP

## 2018-04-03 ENCOUNTER — Ambulatory Visit
Admission: EM | Admit: 2018-04-03 | Discharge: 2018-04-03 | Disposition: A | Discharge status: Home / Self Care | Payer: Medicare Other

## 2018-04-03 ENCOUNTER — Encounter: Payer: Self-pay | Admitting: Emergency Medicine

## 2018-04-03 ENCOUNTER — Other Ambulatory Visit: Payer: Self-pay

## 2018-04-03 DIAGNOSIS — H6982 Other specified disorders of Eustachian tube, left ear: Secondary | ICD-10-CM

## 2018-04-03 NOTE — Discharge Instructions (Signed)
Also consider using Chlor-Trimeton as a decongestant along with the Flonase.  Flonase should be used daily for the next 2 to 3 weeks.  If not improving recommend following up with ear nose and throat

## 2018-04-03 NOTE — ED Triage Notes (Signed)
Pt c/o left ear pain. Started last night. No other symptoms.

## 2018-04-03 NOTE — ED Provider Notes (Signed)
[facility]    CSN: [id number] Arrival date & time: 04/03/18  4098     History   Chief Complaint Chief Complaint  Patient presents with  . Otalgia    left    HPI [name] is a 84 y.o. female.   HPI  84 year old female presents accompanied by her caretaker complaining of left ear pain.  She was complaining of ear pain last night and then this morning actually asked to be brought to the doctor.  Dates that he has the feeling of the pain deep in her ear.  She is had no draining no tinnitus.  She had a congestion cold that predated the ear pain by approximately 1 week.  The caretaker had given her Mucinex and seemed to clear up.  She denies any fever or chills.  Is had no popping with swallowing.  She states that as she sits here today she has no pain.        Past Medical History:  Diagnosis Date  . COPD (chronic obstructive pulmonary disease) (HCC)   . DVT (deep venous thrombosis) (HCC)   . Hypertension   . Pneumothorax     Patient Active Problem List   Diagnosis Date Noted  . Lymphedema 01/19/2017  . Acute respiratory failure (HCC) 12/07/2016  . Spontaneous pneumothorax 12/05/2016  . Acute respiratory failure with hypoxia (HCC) 08/07/2016  . Influenza 08/05/2016  . Dyspnea 08/05/2016  . Constipation 08/05/2016  . Back pain 08/05/2016  . Generalized weakness 08/05/2016  . Intractable back pain 08/01/2016  . Mixed hyperlipidemia 08/16/2014  . HTN (hypertension) 11/04/2013    Past Surgical History:  Procedure Laterality Date  . ABDOMINAL HYSTERECTOMY    . colon cancer    . HEMIARTHROPLASTY SHOULDER FRACTURE Left   . TIBIA FRACTURE SURGERY Left     OB History   None      Home Medications    Prior to Admission medications   Medication Sig Start Date End Date Taking? Authorizing Provider  acetaminophen (TYLENOL) 650 MG CR tablet Take 650 mg by mouth 2 (two) times daily.   Yes [provider]  albuterol (PROVENTIL HFA;VENTOLIN  HFA) 108 (90 Base) MCG/ACT inhaler Inhale into the lungs. 12/15/16  Yes [provider]  albuterol (PROVENTIL) (2.5 MG/3ML) 0.083% nebulizer solution Inhale into the lungs. 03/01/18 03/01/19 Yes [provider]  ALPRAZolam Prudy Feeler) 0.25 MG tablet Take 1 tablet (0.25 mg total) by mouth 3 (three) times daily as needed for anxiety. 08/11/16  Yes Katha Hamming, MD  amLODipine (NORVASC) 2.5 MG tablet Take 2.5 mg by mouth daily. 03/17/16 04/03/18 Yes [provider]  atorvastatin (LIPITOR) 10 MG tablet Take by mouth. 12/20/13 11/18/18 Yes [provider]  benzonatate (TESSALON) 100 MG capsule TAKE 2 CAPSULES BY MOUTH THREE TIMES DAILY AS NEEDED FOR COUGH 02/20/18  Yes [provider]  furosemide (LASIX) 20 MG tablet Take 20 mg by mouth daily. 11/25/16 04/03/18 Yes [provider]  guaiFENesin (MUCINEX) 600 MG 12 hr tablet Take 1 tablet (600 mg total) by mouth 2 (two) times daily. 08/11/16  Yes Katha Hamming, MD  meclizine (ANTIVERT) 12.5 MG tablet Take by mouth. 04/21/17  Yes [provider]  memantine (NAMENDA) 10 MG tablet  03/18/18  Yes [provider]  Multiple Vitamins-Minerals (PRESERVISION AREDS 2+MULTI VIT PO) Take 1 tablet by mouth 2 (two) times daily.   Yes [provider]  rivaroxaban (XARELTO) 20 MG TABS tablet Take by mouth. 09/27/17  Yes [provider]  senna (SENOKOT) 8.6 MG TABS tablet Take 1 tablet (8.6 mg total) by mouth at bedtime. 08/01/16  Yes Veronese, [place], MD  ipratropium (ATROVENT) 0.02 % nebulizer solution Inhale into the lungs. 03/16/18   [provider]  triamcinolone cream (KENALOG) 0.1 % APPLY CREAM TOPICALLY TWICE DAILY 02/24/18   [provider]    Family History Family History  Problem Relation Age of Onset  . Diabetes Maternal Grandmother     Social History Social History   Tobacco Use  . Smoking status: Former Games developer  . Smokeless tobacco: Never Used    Substance Use Topics  . Alcohol use: No  . Drug use: No     Allergies   Sulfa antibiotics; Hydralazine hcl; Nsaids; Tolmetin; and Aspirin   Review of Systems Review of Systems  Constitutional: Positive for activity change. Negative for appetite change, chills, fatigue and fever.  HENT: Positive for congestion and ear pain. Negative for ear discharge.   All other systems reviewed and are negative.    Physical Exam Triage Vital Signs ED Triage Vitals  Enc Vitals Group     BP 04/03/18 0932 (!) 120/103     Pulse Rate 04/03/18 0932 79     Resp 04/03/18 0932 17     Temp 04/03/18 0932 98.6 F (37 C)     Temp Source 04/03/18 0932 Oral     SpO2 04/03/18 0932 93 %     Weight 04/03/18 0930 215 lb (97.5 kg)     Height 04/03/18 0930 5\' 4"  (1.626 m)     Head Circumference --      Peak Flow --      Pain Score 04/03/18 0928 4     Pain Loc --      Pain Edu? --      Excl. in GC? --    No data found.  Updated Vital Signs BP (!) 120/103 (BP Location: Left Arm)   Pulse 79   Temp 98.6 F (37 C) (Oral)   Resp 17   Ht 5\' 4"  (1.626 m)   Wt 215 lb (97.5 kg)   SpO2 93%   BMI 36.90 kg/m   Visual Acuity Right Eye Distance:   Left Eye Distance:   Bilateral Distance:    Right Eye Near:   Left Eye Near:    Bilateral Near:     Physical Exam  Constitutional: She is oriented to person, place, and time. She appears well-developed and well-nourished. No distress.  HENT:  Head: Normocephalic.  Right Ear: External ear normal.  Left Ear: External ear normal.  Nose: Nose normal.  Mouth/Throat: Oropharynx is clear and moist. No oropharyngeal exudate.  Left ear canal appears normal.  The left TM has a normal light reflex but is mildly sclerotic appearing.  Eyes: Pupils are equal, round, and reactive to light. Right eye exhibits no discharge. Left eye exhibits no discharge.  Neck: Normal range of motion.  Musculoskeletal: Normal range of motion.  Neurological: She is alert and oriented  to person, place, and time.  Skin: Skin is warm and dry. She is not diaphoretic.  Psychiatric: She has a normal mood and affect. Her behavior is normal. Judgment and thought content normal.  Nursing note and vitals reviewed.    UC Treatments / Results  Labs (all labs ordered are listed, but only abnormal results are displayed) Labs Reviewed - No data to display  EKG None  Radiology No results found.  Procedures Procedures (including critical care time)  Medications Ordered in UC Medications - No data to display  Initial Impression / Assessment and Plan / UC Course  I have reviewed the triage vital signs and the nursing notes.  Pertinent labs & imaging results that were available during my care of the patient were reviewed by me and considered in my medical decision making (see chart for details).     The patient and the caretaker that this is likely eustachian tube dysfunction possibly from her previous congestion that she had.  This time I will place her on Flonase for 2 to 3 weeks.  Because of her high blood pressure she should use Chlor-Trimeton as a decongestant.  Not improving in 2 weeks she should follow-up with an ear nose and throat. Final Clinical Impressions(s) / UC Diagnoses   Final diagnoses:  Eustachian tube dysfunction, left     Discharge Instructions     Also consider using Chlor-Trimeton as a decongestant along with the Flonase.  Flonase should be used daily for the next 2 to 3 weeks.  If not improving recommend following up with ear nose and throat    ED Prescriptions    None     Controlled Substance Prescriptions [place] Controlled Substance Registry consulted? Not Applicable   Lutricia Feil, PA-C 04/03/18 1010

## 2018-09-29 ENCOUNTER — Ambulatory Visit (INDEPENDENT_AMBULATORY_CARE_PROVIDER_SITE_OTHER): Payer: Medicare Other | Admitting: Nurse Practitioner

## 2018-11-25 ENCOUNTER — Ambulatory Visit (INDEPENDENT_AMBULATORY_CARE_PROVIDER_SITE_OTHER): Payer: Medicare Other | Admitting: Nurse Practitioner

## 2018-11-25 ENCOUNTER — Other Ambulatory Visit (INDEPENDENT_AMBULATORY_CARE_PROVIDER_SITE_OTHER): Payer: Medicare Other

## 2018-11-25 ENCOUNTER — Encounter (INDEPENDENT_AMBULATORY_CARE_PROVIDER_SITE_OTHER): Payer: Self-pay | Admitting: Nurse Practitioner

## 2018-11-25 ENCOUNTER — Other Ambulatory Visit: Payer: Self-pay

## 2018-11-25 ENCOUNTER — Other Ambulatory Visit (INDEPENDENT_AMBULATORY_CARE_PROVIDER_SITE_OTHER): Payer: Self-pay | Admitting: Nurse Practitioner

## 2018-11-25 VITALS — BP 120/71 | HR 91 | Resp 12 | Ht 66.0 in

## 2018-11-25 DIAGNOSIS — M79604 Pain in right leg: Secondary | ICD-10-CM

## 2018-11-25 DIAGNOSIS — E782 Mixed hyperlipidemia: Secondary | ICD-10-CM | POA: Diagnosis not present

## 2018-11-25 DIAGNOSIS — M79605 Pain in left leg: Secondary | ICD-10-CM

## 2018-11-25 DIAGNOSIS — Z87891 Personal history of nicotine dependence: Secondary | ICD-10-CM

## 2018-11-25 DIAGNOSIS — I890 Lymphedema, not elsewhere classified: Secondary | ICD-10-CM | POA: Diagnosis not present

## 2018-11-25 DIAGNOSIS — I10 Essential (primary) hypertension: Secondary | ICD-10-CM | POA: Diagnosis not present

## 2018-11-25 DIAGNOSIS — Z79899 Other long term (current) drug therapy: Secondary | ICD-10-CM

## 2018-11-29 ENCOUNTER — Encounter (INDEPENDENT_AMBULATORY_CARE_PROVIDER_SITE_OTHER): Payer: Self-pay | Admitting: Nurse Practitioner

## 2018-11-29 NOTE — Progress Notes (Signed)
SUBJECTIVE:  Patient ID: [name], female    DOB: 09/11/1929, 85 y.o.   MRN: [id number] Chief Complaint  Patient presents with  . Follow-up    HPI  [name] is a 85 y.o. female that presents today for 83-month follow-up of her lymphedema.  The patient states that recently her legs have been hurting her very greatly.  The patient is wheelchair-bound and spends a lot of time in a dependent position.  The patient also recently had a bout with cellulitis, which is still ongoing.  She is currently being treated by her primary care physician.  She states that since she began with the cellulitis her legs hurt extremely bad especially when she lays down.  She has been ruled out for DVT.  She underwent noninvasive studies today which reveal ABIs of 1.25 on her right and 1.19 on the left.  She has triphasic tibial artery waveforms bilaterally.  Past Medical History:  Diagnosis Date  . COPD (chronic obstructive pulmonary disease) (HCC)   . DVT (deep venous thrombosis) (HCC)   . Hypertension   . Pneumothorax     Past Surgical History:  Procedure Laterality Date  . ABDOMINAL HYSTERECTOMY    . colon cancer    . HEMIARTHROPLASTY SHOULDER FRACTURE Left   . TIBIA FRACTURE SURGERY Left     Social History   Socioeconomic History  . Marital status: Married    Spouse name: Not on file  . Number of children: Not on file  . Years of education: Not on file  . Highest education level: Not on file  Occupational History  . Not on file  Social Needs  . Financial resource strain: Not on file  . Food insecurity:    Worry: Not on file    Inability: Not on file  . Transportation needs:    Medical: Not on file    Non-medical: Not on file  Tobacco Use  . Smoking status: Former Games developer  . Smokeless tobacco: Never Used  Substance and Sexual Activity  . Alcohol use: No  . Drug use: No  . Sexual activity: Not on file  Lifestyle  . Physical activity:    Days per week: Not on file   Minutes per session: Not on file  . Stress: Not on file  Relationships  . Social connections:    Talks on phone: Not on file    Gets together: Not on file    Attends religious service: Not on file    Active member of club or organization: Not on file    Attends meetings of clubs or organizations: Not on file    Relationship status: Not on file  . Intimate partner violence:    Fear of current or ex partner: Not on file    Emotionally abused: Not on file    Physically abused: Not on file    Forced sexual activity: Not on file  Other Topics Concern  . Not on file  Social History Narrative  . Not on file    Family History  Problem Relation Age of Onset  . Diabetes Maternal Grandmother     Allergies  Allergen Reactions  . Sulfa Antibiotics Other (See Comments)    dizziness dizziness  . Hydralazine Hcl Other (See Comments)    Other reaction(s): Unknown  Other reaction(s): Unknown  . Nsaids     Pt w/ CKD3 Pt w/ CKD3  . Tolmetin     Pt w/ CKD3  . Aspirin Nausea Only  and Other (See Comments)    Abdominal pain     Review of Systems   Review of Systems: Negative Unless Checked Constitutional: [] Weight loss  [] Fever  [] Chills Cardiac: [] Chest pain   []  Atrial Fibrillation  [] Palpitations   [] Shortness of breath when laying flat   [] Shortness of breath with exertion. [] Shortness of breath at rest Vascular:  [] Pain in legs with walking   [] Pain in legs with standing [] Pain in legs when laying flat   [] Claudication    [] Pain in feet when laying flat    [x] History of DVT   [] Phlebitis   [x] Swelling in legs   [] Varicose veins   [] Non-healing ulcers Pulmonary:   [] Uses home oxygen   [] Productive cough   [] Hemoptysis   [] Wheeze  [x] COPD   [] Asthma Neurologic:  [] Dizziness   [] Seizures  [] Blackouts [] History of stroke   [] History of TIA  [] Aphasia   [] Temporary Blindness   [] Weakness or numbness in arm   [] Weakness or numbness in leg Musculoskeletal:   [] Joint swelling   [] Joint pain    [] Low back pain  []  History of Knee Replacement [] Arthritis [] back Surgeries  []  Spinal Stenosis    Hematologic:  [] Easy bruising  [] Easy bleeding   [] Hypercoagulable state   [] Anemic Gastrointestinal:  [] Diarrhea   [] Vomiting  [] Gastroesophageal reflux/heartburn   [] Difficulty swallowing. [] Abdominal pain Genitourinary:  [] Chronic kidney disease   [] Difficult urination  [] Anuric   [] Blood in urine [] Frequent urination  [] Burning with urination   [] Hematuria Skin:  [] Rashes   [] Ulcers [] Wounds Psychological:  [] History of anxiety   []  History of major depression  []  Memory Difficulties      OBJECTIVE:   Physical Exam  BP 120/71 (BP Location: Right Arm, Patient Position: Sitting, Cuff Size: Large)   Pulse 91   Resp 12   Ht 5\' 6"  (1.676 m)   BMI 34.70 kg/m   Gen: WD/WN, NAD Head: [place]/AT, No temporalis wasting.  Ear/Nose/Throat: Hearing grossly intact, nares w/o erythema or drainage Eyes: PER, EOMI, sclera nonicteric.  Neck: Supple, no masses.  No JVD.  Pulmonary:  Good air movement, no use of accessory muscles.  Cardiac: RRR Vascular: 3+ hard edema bilaterally  Vessel Right Left  Radial Palpable Palpable  Dorsalis Pedis Palpable Palpable  Posterior Tibial Palpable Palpable   Gastrointestinal: soft, non-distended. No guarding/no peritoneal signs.  Musculoskeletal: M/S 5/5 throughout.  No deformity or atrophy.  Neurologic: Pain and light touch intact in extremities.  Symmetrical.  Speech is fluent. Motor exam as listed above. Psychiatric: Judgment intact, Mood & affect appropriate for pt's clinical situation. Dermatologic: No Venous rashes. No Ulcers Noted.  Evidence of cellulitis still present Lymph : No Cervical lymphadenopathy, no lichenification or skin changes of chronic lymphedema.       ASSESSMENT AND PLAN:  1. Lymphedema The patient should continue to try to follow medical grade 1 compression therapy as much as possible.  Currently with cellulitis it will be difficult  for her to wear medical grade 1 compression stockings which is where elevation was stressed.  The patient has a lymphedema pump which should continue to be used.  I spoke with the patient about Unna wraps however that at this time they do not wish to proceed.  Advised the patient to place medical grade 1 compression stockings as soon as cellulitis is resolved.  If cellulitis is resistant to treatment or if swelling persist should contact her office to be seen for possible interim placement but otherwise aggressive conservative therapy should  be utilized.  2. Hypertension, unspecified type Continue antihypertensive medications as already ordered, these medications have been reviewed and there are no changes at this time.   3. Mixed hyperlipidemia Continue statin as ordered and reviewed, no changes at this time    Current Outpatient Medications on File Prior to Visit  Medication Sig Dispense Refill  . acetaminophen (TYLENOL) 650 MG CR tablet Take 650 mg by mouth 2 (two) times daily.    Marland Kitchen albuterol (PROVENTIL HFA;VENTOLIN HFA) 108 (90 Base) MCG/ACT inhaler Inhale into the lungs.    Marland Kitchen albuterol (PROVENTIL) (2.5 MG/3ML) 0.083% nebulizer solution Inhale into the lungs.    . ALPRAZolam (XANAX) 0.25 MG tablet Take 1 tablet (0.25 mg total) by mouth 3 (three) times daily as needed for anxiety. 30 tablet 0  . amLODipine (NORVASC) 2.5 MG tablet Take 2.5 mg by mouth daily.    Marland Kitchen atorvastatin (LIPITOR) 10 MG tablet Take by mouth.    . benzonatate (TESSALON) 100 MG capsule TAKE 2 CAPSULES BY MOUTH THREE TIMES DAILY AS NEEDED FOR COUGH  1  . furosemide (LASIX) 20 MG tablet Take 20 mg by mouth daily.    Marland Kitchen guaiFENesin (MUCINEX) 600 MG 12 hr tablet Take 1 tablet (600 mg total) by mouth 2 (two) times daily. 10 tablet 0  . ipratropium (ATROVENT) 0.02 % nebulizer solution Inhale into the lungs.    . meclizine (ANTIVERT) 12.5 MG tablet Take by mouth.    . memantine (NAMENDA) 10 MG tablet     . Multiple  Vitamins-Minerals (PRESERVISION AREDS 2+MULTI VIT PO) Take 1 tablet by mouth 2 (two) times daily.    . rivaroxaban (XARELTO) 20 MG TABS tablet Take by mouth.    . senna (SENOKOT) 8.6 MG TABS tablet Take 1 tablet (8.6 mg total) by mouth at bedtime. 30 each 0  . triamcinolone cream (KENALOG) 0.1 % APPLY CREAM TOPICALLY TWICE DAILY  1   No current facility-administered medications on file prior to visit.     There are no Patient Instructions on file for this visit. No follow-ups on file.   Georgiana Spinner, NP  This note was completed with Office manager.  Any errors are purely unintentional.

## 2018-12-30 ENCOUNTER — Ambulatory Visit (INDEPENDENT_AMBULATORY_CARE_PROVIDER_SITE_OTHER): Payer: Medicare Other | Admitting: Nurse Practitioner

## 2019-01-06 ENCOUNTER — Ambulatory Visit (INDEPENDENT_AMBULATORY_CARE_PROVIDER_SITE_OTHER): Payer: Medicare Other | Admitting: Nurse Practitioner

## 2019-02-03 ENCOUNTER — Other Ambulatory Visit: Payer: Self-pay

## 2019-02-03 ENCOUNTER — Other Ambulatory Visit: Payer: Medicare Other | Admitting: Primary Care

## 2019-02-03 DIAGNOSIS — Z515 Encounter for palliative care: Secondary | ICD-10-CM

## 2019-02-03 NOTE — Progress Notes (Signed)
[facility] Consult Note Telephone: [phone]  Fax: [phone]   PATIENT NAME: [name] DOB: 07/16/1929 MRN: [id number]  PRIMARY CARE PROVIDER:   Tiana Loftabellon, Melissa, MD [phone]   REFERRING PROVIDER:  Tiana Loftabellon, Melissa, MD [address] [place],  [place] [id number] [phone]  RESPONSIBLE PARTY:  Extended Emergency Contact Information Primary Emergency Contact: [name],[name] Address: [address]          [place], [place] [id number] Home Phone: [phone] Work Phone: [phone] Relation: Son Secondary Emergency Contact: [name],[name]  [place] Home Phone: [phone] Relation: Son  Palliative Care was asked to follow this patient by consultation request of Tiana Loftabellon, Melissa, MD. This is the initial  visit.  ASSESSMENT AND RECOMMENDATIONS:   1. Goals of Care: Maximize quality of life and symptom management.  2. Symptom Management:   Agitation/depression: Recommend sertraline 25 mg to begin and increase as tolerated. Meds reviewed and list updated. Caregivers state patient can become very irritable and act out. I recommend beginning with SSRI and maximize effect of that before addressing other classes of medications. Patient care givers and family report easily agitated and depressed. Request treatment of co morbid depressed mood.   Mobility: Difficulty with positioning, patient has had PT for 9 weeks but not made much  progress. This will likely d/c. Patient is not weight bearing or minimally wt bearing.  Hoyer lift in home but patient is mostly in bed. Caregivers want a sit to stand lift but [facility] [phone] did not know of a way to adapt the hoyer from body lift to sit to stand. Other outlets stat patient needs 25% ability to weight bear to use it.They also would like a large size wheel chair. Bariatric chairs begin at 250 lbs however and patient is less. I will call more DME companies to find supply and  will call PCP office for DME prescription and order.  Lymphedema: Often less now that she's in bed most of the time, They have some profore wrap kits and we discussed them getting other wraps to keep edema at bay. She has pumps as well to use at HS. Seems less an issue now with her largely bed bound.  3. Family /Caregiver/Community Supports:  Has full time live in caregivers, has 3 sons in area. Husband died several years ago.   4. Cognitive / Functional decline:  Alert, oriented x 1-2. States son helps with medical decisions. Caregivers feel patient is easily agitated and needs some sort of management in order to optimize mood.  5. Advanced Care Directive: POA is [name], will call to discuss.I left a MOST in home. They had one but it was not dated or signed, and referred to discussion with (now) deceased spouse so I left a new form for them to complete. I will upload on my next visit.  6. Follow up Palliative Care Visit: Palliative care will continue to follow for goals of care clarification and symptom management. Return 4-6 weeks or prn.  I spent 60 minutes providing this consultation,  from 1300 to 1400. More than 50% of the time in this consultation was spent coordinating communication.   HISTORY OF PRESENT ILLNESS:  [name] is a 85 y.o. year old female with multiple medical problems including AD dx this year by [place] [facility] [place], HTN, DVT, COPD with smoking history. Palliative Care was asked to help address goals of care.   CODE STATUS: TBD, left MOST with familly.  PPS: 30%  HOSPICE ELIGIBILITY/DIAGNOSIS: TBD  PAST MEDICAL HISTORY:  Past Medical History:  Diagnosis Date  . COPD (chronic obstructive pulmonary disease) (HCC)   . DVT (deep venous thrombosis) (HCC)   . Hypertension   . Pneumothorax     SOCIAL HX:  Social History   Tobacco Use  . Smoking status: Former Games developermoker  . Smokeless tobacco: Never Used  Substance Use Topics  . Alcohol use: No     ALLERGIES:  Allergies  Allergen Reactions  . Sulfa Antibiotics Other (See Comments)    dizziness dizziness  . Hydralazine Hcl Other (See Comments)    Other reaction(s): Unknown  Other reaction(s): Unknown  . Nsaids     Pt w/ CKD3 Pt w/ CKD3  . Tolmetin     Pt w/ CKD3  . Aspirin Nausea Only and Other (See Comments)    Abdominal pain     PERTINENT MEDICATIONS:  Outpatient Encounter Medications as of 02/03/2019  Medication Sig  . acetaminophen (TYLENOL) 650 MG CR tablet Take 650 mg by mouth 2 (two) times daily.  Marland Kitchen. albuterol (PROVENTIL HFA;VENTOLIN HFA) 108 (90 Base) MCG/ACT inhaler Inhale into the lungs.  Marland Kitchen. albuterol (PROVENTIL) (2.5 MG/3ML) 0.083% nebulizer solution Inhale into the lungs.  . ALPRAZolam (XANAX) 0.25 MG tablet Take 1 tablet (0.25 mg total) by mouth 3 (three) times daily as needed for anxiety.  Marland Kitchen. amLODipine (NORVASC) 2.5 MG tablet Take 2.5 mg by mouth daily.  Marland Kitchen. atorvastatin (LIPITOR) 10 MG tablet Take by mouth.  . benzonatate (TESSALON) 100 MG capsule TAKE 2 CAPSULES BY MOUTH THREE TIMES DAILY AS NEEDED FOR COUGH  . furosemide (LASIX) 20 MG tablet Take 20 mg by mouth daily.  Marland Kitchen. guaiFENesin (MUCINEX) 600 MG 12 hr tablet Take 1 tablet (600 mg total) by mouth 2 (two) times daily.  Marland Kitchen. ipratropium (ATROVENT) 0.02 % nebulizer solution Inhale into the lungs.  . meclizine (ANTIVERT) 12.5 MG tablet Take by mouth.  . memantine (NAMENDA) 10 MG tablet   . Multiple Vitamins-Minerals (PRESERVISION AREDS 2+MULTI VIT PO) Take 1 tablet by mouth 2 (two) times daily.  . rivaroxaban (XARELTO) 20 MG TABS tablet Take by mouth.  . senna (SENOKOT) 8.6 MG TABS tablet Take 1 tablet (8.6 mg total) by mouth at bedtime.  . triamcinolone cream (KENALOG) 0.1 % APPLY CREAM TOPICALLY TWICE DAILY   No facility-administered encounter medications on file as of 02/03/2019.     PHYSICAL EXAM/ROS:   Current and past weights: 224 lbs. In May 2020. Ht. 5'5" General: NAD, frail appearing, Pain rate as  0/10. Cardiovascular: no chest pain reported, lymphedema Pulmonary: no cough, no increased SOB  Abdomen: appetite very good, endorses occ.constipation, incontinent of bowel GU: denies dysuria, incontinent of urine, Recent UTI , Rx with Levaquin and seems resolved per care givers MSK:  no joint deformities, working on stand/pivot transfer x 9 weeks. PT to d/c soon. Lymphedema with pumps. Needs wheel chair (bariatric) with wheel chair with longer arms - current chair has 18/16/18 in and she needs 18 x24.Current wheel chair was bought for cash.  Need 30" clearance for home doors. Skin: no rashes or wounds reported, no wounds reported.  Neurological: Weakness, dementia, FAST score 7A. States she sleeps fair, often she awakens at night. 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 States 3 sons; they visit some, 2 come regularly.  Often has outbursts of anger per care givers, and they'd like depression addressed.  Marijo FileKathryn M Brannigan DNP AGPCNP-BC  COVID-19 PATIENT SCREENING TOOL  Person answering questions: _______Caregiver____________ _____   1.  Is the patient or any family member in the home showing any signs or symptoms regarding respiratory infection?               Person with Symptom- _____na______________________  a. Fever                                                                          Yes___ [place]  ___          ___________________  b. Shortness of breath                                                    Yes___ No_x__          ___________________ c. Cough/congestion                                       Yes___  No__xxx_         ___________________ d. Body aches/pains                                                         Yes___ No__x_        ____________________ e. Gastrointestinal symptoms (diarrhea, nausea)           Yes___ No_x__        ____________________  2. Within the past 14 days, has anyone living in the home had any contact with someone with or under  investigation for COVID-19?    Yes___ No_x_   Person __________________

## 2019-02-04 ENCOUNTER — Telehealth: Payer: Self-pay | Admitting: Primary Care

## 2019-02-04 NOTE — Telephone Encounter (Signed)
I called [facility] but no answer, left vm. I also called [place] to order a wheel chair. Neither [facility] or [facility] carry a sit to stand lift nor do they know of an adaptive device to change the bed lift to a sit to stand. If family is agreeable, PCP should  please fax in order for wheel chair: The company will need:  Face to face, height and weight, need for chair, length of need, elevated leg rests, prescription for wheel chair Demographics and insurance information.  Fax [phone] [facility] [employer] company of choice.

## 2019-02-07 ENCOUNTER — Telehealth: Payer: Self-pay | Admitting: Primary Care

## 2019-02-07 NOTE — Telephone Encounter (Signed)
T/c from son returning call. He discussed her grief at her husband's death not ever being worked through. He states she is resistant to some care e.g. exercising. States family history of AD, He agrees that PT is having to end due to lack of meeting goals. Does not perform HEP and has not from previous injuries. Son denies s/sx pain. [place] states he does not think she was on an antidepressant. They'd also like a wheel chair for large person, wt 220 lb and height 5'8". Pharmacy used is [facility] in [place].   T/c to MD to report above, request to start antidepressant,  and to fax information for w/c to [facility].

## 2019-02-28 ENCOUNTER — Telehealth: Payer: Self-pay | Admitting: Primary Care

## 2019-02-28 NOTE — Telephone Encounter (Signed)
T/c to patient to set up time for home visit. They have received the wheel chair. Home visit will be made this week.

## 2019-03-01 ENCOUNTER — Telehealth: Payer: Self-pay | Admitting: Primary Care

## 2019-03-01 NOTE — Telephone Encounter (Signed)
Returned call to MD office and left VM with Danette. I let them know I had seen patient once at home, plan to see her again this week, and would like to know more of their current concerns. Message left requesting call.

## 2019-03-03 ENCOUNTER — Other Ambulatory Visit: Payer: Medicare Other | Admitting: Primary Care

## 2019-03-03 ENCOUNTER — Other Ambulatory Visit: Payer: Self-pay

## 2019-03-03 DIAGNOSIS — Z515 Encounter for palliative care: Secondary | ICD-10-CM

## 2019-03-03 NOTE — Progress Notes (Signed)
[facility] Consult Note Telephone: [phone]  Fax: [phone]  PATIENT NAME: [name] DOB: [birth year] MRN: [id number]  PRIMARY CARE PROVIDER:   Gale Journey, MD, [address] [place] [address] [phone]  REFERRING PROVIDER:  Gale Journey, MD [address] [place],  [place] [address] [phone]  RESPONSIBLE PARTY:   Extended Emergency Contact Information Primary Emergency Contact: Roat,[name] Address: [place], [place] [id number] Home Phone: [phone] Work Phone: [phone] Relation: Son Secondary Emergency Contact: Thornhill,Clifford  [place] Phone: [phone] Relation: Son   ASSESSMENT AND RECOMMENDATIONS:   1. Advance Care Planning/Goals of Care: Goals include to maximize quality of life and symptom management.MOST completed by family and outline DNR, comfort measures for scope of care, antibiotic use if needed, IV use if needed, no feeding tube. This was completed by son and POA Oscar Forman.  We also discussed goals of care and the benefit of continued testing blood, ed trips, etc. Concensus was that much of this monitoring may not change the plan of care. For instance, a trip to the ED was very  Exhausting for the patient without an appreciable change in care plan. Will continue to discuss goals of care and concordant interventions.  2. Symptom Management:   Mobility: Discussed stand up sling. We looked again at [facility] lift and an adaptation piece seems to be available from the manufacturer. No one locally apparently had these supplies so I encouraged care giver to phone to see if she could order directly. I am not sure patient would be able to use but they would like to try and consult with PT.  Depression: Continue sertraline. Started on sertraline with very good response. Has been on it 3 weeks now. We discussed ability to increase if needed. Patient appeared go  be more comfortable, relaxed and not anxious, not in pain.  3. Family /Caregiver/Community Supports: Round the clock care givers who live in, family in area.  We discussed home visiting PCP services. Pt is either out of area or it would require a trip fee. They will continue using  Current PCP and telemedicine as long as possible.  4. Cognitive / Functional decline: Seems more pleasant and relaxed. Still in bed most of time, up in chair during day with lift.  5. Follow up Palliative Care Visit: Palliative care will continue to follow for goals of care clarification and symptom management. Return 4-6 weeks or prn.  I spent 60 minutes providing this consultation,  from 1030 to 1130. More than 50% of the time in this consultation was spent coordinating communication.   HISTORY OF PRESENT ILLNESS:  [name] is a 85 y.o. year old female with multiple medical problems including COPD, depression and anxiety. Palliative Care was asked to follow this patient by consultation request of Gale Journey, MD to help address advance care planning and goals of care. This is a follow up visit.  CODE STATUS: DNR, MOST  Uploaded. DNR, comfort measures for scope of care, antibiotic use if needed,  PPS: 30% HOSPICE ELIGIBILITY/DIAGNOSIS: NO, > 6 month life expectancy  PAST MEDICAL HISTORY:  Past Medical History:  Diagnosis Date  . COPD (chronic obstructive pulmonary disease) ([place])   . DVT (deep venous thrombosis) ([facility])   . Hypertension   . Pneumothorax     SOCIAL HX:  Social History   Tobacco Use  . Smoking status: Former Research scientist (life sciences)  .  Smokeless tobacco: Never Used  Substance Use Topics  . Alcohol use: No    ALLERGIES:  Allergies  Allergen Reactions  . Sulfa Antibiotics Other (See Comments)    dizziness dizziness  . Hydralazine Hcl Other (See Comments)    Other reaction(s): Unknown  Other reaction(s): Unknown  . Nsaids     Pt w/ CKD3 Pt w/ CKD3  . Tolmetin     Pt w/ CKD3  . Aspirin  Nausea Only and Other (See Comments)    Abdominal pain     PERTINENT MEDICATIONS:  Outpatient Encounter Medications as of 03/03/2019  Medication Sig  . acetaminophen (TYLENOL) 650 MG CR tablet Take 650 mg by mouth 2 (two) times daily.  Marland Kitchen. albuterol (PROVENTIL HFA;VENTOLIN HFA) 108 (90 Base) MCG/ACT inhaler Inhale into the lungs.  . ALPRAZolam (XANAX) 0.25 MG tablet Take 1 tablet (0.25 mg total) by mouth 3 (three) times daily as needed for anxiety. (Patient not taking: Reported on 02/03/2019)  . amLODipine (NORVASC) 2.5 MG tablet Take 2.5 mg by mouth daily.  Marland Kitchen. apixaban (ELIQUIS) 2.5 MG TABS tablet Take 2.5 mg by mouth 2 (two) times daily.  Marland Kitchen. atorvastatin (LIPITOR) 10 MG tablet Take by mouth.  . benzonatate (TESSALON) 100 MG capsule TAKE 2 CAPSULES BY MOUTH THREE TIMES DAILY AS NEEDED FOR COUGH  . furosemide (LASIX) 20 MG tablet Take 20 mg by mouth 2 (two) times daily.   Marland Kitchen. guaiFENesin (MUCINEX) 600 MG 12 hr tablet Take 1 tablet (600 mg total) by mouth 2 (two) times daily.  Marland Kitchen. ipratropium (ATROVENT) 0.02 % nebulizer solution Inhale into the lungs.  . meclizine (ANTIVERT) 12.5 MG tablet Take by mouth.   . memantine (NAMENDA) 10 MG tablet   . Multiple Vitamins-Minerals (PRESERVISION AREDS 2+MULTI VIT PO) Take 1 tablet by mouth 2 (two) times daily.  . rivaroxaban (XARELTO) 20 MG TABS tablet Take by mouth.  . senna (SENOKOT) 8.6 MG TABS tablet Take 1 tablet (8.6 mg total) by mouth at bedtime.  . triamcinolone cream (KENALOG) 0.1 % APPLY CREAM TOPICALLY TWICE DAILY   No facility-administered encounter medications on file as of 03/03/2019.     PHYSICAL EXAM / ROS:   Current and past weights: unavailable General: NAD, frail appearing, obese Cardiovascular: no chest pain reported, no edema in LE Pulmonary: no cough, no increased SOB, denies DOE.Oxygen at 2 L/ [place], Smoked 2 packs a day 50 years= 100 pack years. Abdomen: appetite good,  Endorses constipation, incontinent of bowel  GU: denies dysuria,  incontinent of urine MSK:  no joint deformities, ambulatory Skin: no rashes or wounds reported,  blood blister on left great toe Neurological: Weakness, sertraline 25 mg, better mood from this, now 3 weeks. Sleeps all night. Has decreased with hallucinations.    Marijo FileKathryn M Heyward DNP AGPCNP-BC  COVID-19 PATIENT SCREENING TOOL  Person answering questions: ________Caregiver___________ _____   1.  Is the patient or any family member in the home showing any signs or symptoms regarding respiratory infection?               Person with Symptom- _________NA__________________  a. Fever  Yes___ No___          ___________________  b. Shortness of breath                                                    Yes___ No___          ___________________ c. Cough/congestion                                       Yes___  No___         ___________________ d. Body aches/pains                                                         Yes___ No___        ____________________ e. Gastrointestinal symptoms (diarrhea, nausea)           Yes___ No___        ____________________  2. Within the past 14 days, has anyone living in the home had any contact with someone with or under investigation for COVID-19?    Yes___ No_X_   Person __________________

## 2019-04-14 ENCOUNTER — Telehealth: Payer: Self-pay | Admitting: Primary Care

## 2019-04-14 NOTE — Telephone Encounter (Signed)
Correction, palliative home visit will be 10/12 at 12 noon.

## 2019-04-14 NOTE — Telephone Encounter (Signed)
T/c 04/13/2019 from PCP asking for  Urgent follow up with patient. T/c to patient home, they denied an urgency from their understanding, and made an appt for 04/25/2019 for noon. T/c to PCP and reported appt made, office stated there was not an emergency and this time frame would be fine for palliative assessment.

## 2019-04-18 ENCOUNTER — Other Ambulatory Visit: Payer: Medicare Other | Admitting: Primary Care

## 2019-04-18 ENCOUNTER — Other Ambulatory Visit: Payer: Self-pay

## 2019-04-18 DIAGNOSIS — Z515 Encounter for palliative care: Secondary | ICD-10-CM

## 2019-04-18 NOTE — Progress Notes (Signed)
Therapist, nutritional Palliative Care Consult Note Telephone: [phone]  Fax: [phone]  PATIENT NAME: [name] [address] [place] [address] [phone] (home)  DOB: 01/17/1930 MRN: [id number]  PRIMARY CARE PROVIDER:   Tiana Loft, MD, [address] [place] [address] [phone]  REFERRING PROVIDER:  Tiana Loft, MD [address] [place],  [place] [address] [phone]  RESPONSIBLE PARTY:   Extended Emergency Contact Information Primary Emergency Contact: Tierney,[name] Address: [place], [place] [id number] Home Phone: [phone] Work Phone: [phone] Relation: Son Secondary Emergency Contact: Hickling,Clifford  [place] Home Phone: [phone] Relation: Son   ASSESSMENT AND RECOMMENDATIONS:   1. Advance Care Planning/Goals of Care: Goals include to maximize quality of life and symptom management. DNR, comfort measures for scope of care, antibiotic use if needed, IV use if needed, no feeding tube.  2. Symptom Management:   Skin integrity: Recommend treating skin infection for MRSA. Area on left hip which is proximal to her incision, appears to be an infection with clear sometimes purulent drainage.  Care givers are pushing on it to express it. Area is < 1 cm round, red and with some inner   Mood: States she's in a good mood, family states she has outbursts. Continue Zoloft at 50 mg, tolerating well.  Denies pain. Caregiver states some sundowning, does not discuss any uncontrolled behaviors.  Nutrition: Glucose is around 140 mg/dl, eating more healthy per caregiver report. This is a reasonable reading given palliative goals  3. Family /Caregiver/Community Supports: Son visited, this was my first meeting of him.  Has live in caregivers.  4. Cognitive / Functional decline: Alert, oriented x 2-3. Staying in bed most of the time, has a hoyer lift. Encouraged to get OOB daily but rarely does.   5. Follow up Palliative Care Visit: Palliative care will continue to follow for goals of care clarification and symptom management. Return 4-6 weeks or prn.  I spent 40 minutes providing this consultation,  from 1215 to 1255. More than 50% of the time in this consultation was spent coordinating communication.   HISTORY OF PRESENT ILLNESS:  [name] is a 85 y.o. year old female with multiple medical problems including COPD, depression, anxiety, dementia FAST score 6E. Palliative Care was asked to follow this patient by consultation request of Tiana Loft, MD to help address advance care planning and goals of care. This is a follow up visit.  CODE STATUS: DNR, comfort measures for scope of care, antibiotic use if needed, IV use if needed, no feeding tube  PPS: 30% HOSPICE ELIGIBILITY/DIAGNOSIS: TBD  PAST MEDICAL HISTORY:  Past Medical History:  Diagnosis Date  . COPD (chronic obstructive pulmonary disease) ([facility])   . DVT (deep venous thrombosis) ([facility])   . Hypertension   . Pneumothorax     SOCIAL HX:  Social History   Tobacco Use  . Smoking status: Former Games developer  . Smokeless tobacco: Never Used  Substance Use Topics  . Alcohol use: No    ALLERGIES:  Allergies  Allergen Reactions  . Sulfa Antibiotics Other (See Comments)    dizziness dizziness  . Hydralazine Hcl Other (See Comments)    Other reaction(s): Unknown  Other reaction(s): Unknown  . Nsaids     Pt w/ CKD3 Pt w/ CKD3  . Tolmetin     Pt w/ CKD3  . Aspirin Nausea Only and Other (See Comments)    Abdominal  pain     PERTINENT MEDICATIONS:  Outpatient Encounter Medications as of 04/18/2019  Medication Sig  . acetaminophen (TYLENOL) 650 MG CR tablet Take 650 mg by mouth 2 (two) times daily.  Marland Kitchen. albuterol (PROVENTIL HFA;VENTOLIN HFA) 108 (90 Base) MCG/ACT inhaler Inhale into the lungs.  . ALPRAZolam (XANAX) 0.25 MG tablet Take 1 tablet (0.25 mg total) by mouth 3 (three) times daily as needed for anxiety.  (Patient not taking: Reported on 02/03/2019)  . amLODipine (NORVASC) 2.5 MG tablet Take 2.5 mg by mouth daily.  Marland Kitchen. apixaban (ELIQUIS) 2.5 MG TABS tablet Take 2.5 mg by mouth 2 (two) times daily.  Marland Kitchen. atorvastatin (LIPITOR) 10 MG tablet Take by mouth.  . benzonatate (TESSALON) 100 MG capsule TAKE 2 CAPSULES BY MOUTH THREE TIMES DAILY AS NEEDED FOR COUGH  . furosemide (LASIX) 20 MG tablet Take 20 mg by mouth 2 (two) times daily.   Marland Kitchen. guaiFENesin (MUCINEX) 600 MG 12 hr tablet Take 1 tablet (600 mg total) by mouth 2 (two) times daily.  Marland Kitchen. ipratropium (ATROVENT) 0.02 % nebulizer solution Inhale into the lungs.  . meclizine (ANTIVERT) 12.5 MG tablet Take by mouth.   . memantine (NAMENDA) 10 MG tablet   . Multiple Vitamins-Minerals (PRESERVISION AREDS 2+MULTI VIT PO) Take 1 tablet by mouth 2 (two) times daily.  . rivaroxaban (XARELTO) 20 MG TABS tablet Take by mouth.  . senna (SENOKOT) 8.6 MG TABS tablet Take 1 tablet (8.6 mg total) by mouth at bedtime.  . sertraline (ZOLOFT) 25 MG tablet Take 25 mg by mouth at bedtime.  . triamcinolone cream (KENALOG) 0.1 % APPLY CREAM TOPICALLY TWICE DAILY   No facility-administered encounter medications on file as of 04/18/2019.     PHYSICAL EXAM / ROS:   Current and past weights: unavailable General: NAD, frail appearing, obese Cardiovascular: S1S2, Reg rate of 78, no chest pain reported, no edema in LE Pulmonary: no cough, no increased SOB, rate 18, Lungs clear with expiratory wheezes, oxygen at 1.5 L, uses symbicort.Has nebs prn. Abdomen: appetite good, denies  constipation, incontinent of bowel GU: denies dysuria, incontinent of urine MSK:  no joint deformities, ambulatory Skin: Red lesion with drainage, sometimes clear sometimes purulent on left hip, onset 2-3 weeks ago. Surgery was on 5/20. Incision in hip is well healed. Neurological: Weakness, depression responding to 50 mg zoloft, tolerating well. FAST score 6E.  Eliezer LoftsKathryn McKelvey Neuman, NP  COVID-19  PATIENT SCREENING TOOL  Person answering questions: _______CAREGIVER AND SON____________ _____   1.  Is the patient or any family member in the home showing any signs or symptoms regarding respiratory infection?               Person with Symptom- ______NA_____________________  a. Fever                                                                          Yes___ No___          ___________________  b. Shortness of breath  Yes___ No___          ___________________ c. Cough/congestion                                       Yes___  No___         ___________________ d. Body aches/pains                                                         Yes___ No___        ____________________ e. Gastrointestinal symptoms (diarrhea, nausea)           Yes___ No___        ____________________  2. Within the past 14 days, has anyone living in the home had any contact with someone with or under investigation for COVID-19?    Yes___ No__X   Person __________________

## 2019-07-26 ENCOUNTER — Other Ambulatory Visit: Payer: Medicare Other | Admitting: Primary Care

## 2019-07-26 ENCOUNTER — Other Ambulatory Visit: Payer: Self-pay

## 2019-07-26 ENCOUNTER — Telehealth: Payer: Self-pay | Admitting: Primary Care

## 2019-07-26 NOTE — Telephone Encounter (Signed)
T/c from Kerby Moors, [employer] caregiver. She states patient is moving to [facility] with her today indefinitely.She will call when then get settled to see if they are still in our service area.  D/c from Palliative.

## 2020-02-27 ENCOUNTER — Telehealth: Payer: Self-pay | Admitting: Internal Medicine

## 2020-02-27 NOTE — Telephone Encounter (Signed)
Scheduled [facility] Palliative visit for 03-15-20 at 10:30.

## 2020-03-15 ENCOUNTER — Other Ambulatory Visit: Payer: Medicare Other | Admitting: Internal Medicine

## 2020-03-15 ENCOUNTER — Other Ambulatory Visit: Payer: Self-pay

## 2020-04-06 ENCOUNTER — Other Ambulatory Visit: Payer: Medicare Other | Admitting: Internal Medicine

## 2020-04-06 DIAGNOSIS — F0150 Vascular dementia without behavioral disturbance: Secondary | ICD-10-CM

## 2020-04-06 DIAGNOSIS — Z515 Encounter for palliative care: Secondary | ICD-10-CM

## 2020-04-07 NOTE — Progress Notes (Signed)
Therapist, nutritional Palliative Care Consult Note Telephone: [phone]  Fax: [phone]  PATIENT NAME: [name] DOB: [birth year] MRN: [id number]  PRIMARY CARE PROVIDER:   Tiana Loft, MD  REFERRING PROVIDER:  Dr. Pollie Friar  RESPONSIBLE PARTY:    Harewood,[name] Son [phone]  POA       RECOMMENDATIONS and PLAN:  Palliative Care Encounter  Z51.5  1.  Advance care planning:   DNAR and MOST form delegating comfort care, antibiotics and IV fluids if indicated and no feeding tube were previously selected.  Goals remain unchanged with focus on quality of life within home setting.    2.  Vascular dementia without behaviors:  FAST stage 7c which is a decline from 12 months ago.  Continue to monitor cognitive and functional status.  Assist with all ADLs as needed by private caregivers.  3.  Pruitis:  May be related to CKD.  Amlactin or Sarna lotions as needed.  Continue Hydroxyzine as needed per prescription. Improve hydration.   4. Alteration in integument: Currently intact.  Consider use of an air mattress to prevent recurrent decubitus development.  Turn and reposition every 2 hours.  Barrier skin protection.  Float heels   I spent 40 minutes providing this consultation,  from 0915 to 0955. More than 50% of the time in this consultation was spent coordinating communication with patient and caregiver.   HISTORY OF PRESENT ILLNESS:  [name] is a 86 y.o. year old female with multiple medical problems including vascular dementia, COPD, T2DM with CKD. Caregiver [name] states that pt has been non ambulatory since May 2020 and is unable to tolerate sitting upright in bed or chair any length of time.  She becomes "clammy and nauseated"  No reports of BP readings during these episodes.  She is still able to feed herself finger foods and hold a beverage container.  Some agitation in the evening hours which is improved with use of Seroquel.   Primary complaint is of pruritis of her torso which is improved with use of Hydroxyzine as needed.  Palliative Care was asked to help address goals of care.   CODE STATUS: DNAR/DNI  PPS: 30% HOSPICE ELIGIBILITY/DIAGNOSIS: TBD  PAST MEDICAL HISTORY:  Past Medical History:  Diagnosis Date  . COPD (chronic obstructive pulmonary disease) ([facility])   . DVT (deep venous thrombosis) ([facility])   . Hypertension   . Pneumothorax     SOCIAL HX:  Lives with caregiver Social History   Tobacco Use  . Smoking status: Former Games developer  . Smokeless tobacco: Never Used  Substance Use Topics  . Alcohol use: No    ALLERGIES:  Allergies  Allergen Reactions  . Sulfa Antibiotics Other (See Comments)    dizziness dizziness  . Hydralazine Hcl Other (See Comments)    Other reaction(s): Unknown  Other reaction(s): Unknown  . Nsaids     Pt w/ CKD3 Pt w/ CKD3  . Tolmetin     Pt w/ CKD3  . Aspirin Nausea Only and Other (See Comments)    Abdominal pain     PERTINENT MEDICATIONS:  Outpatient Encounter Medications as of 04/06/2020  Medication Sig  . acetaminophen (TYLENOL) 650 MG CR tablet Take 650 mg by mouth 2 (two) times daily.  Marland Kitchen albuterol (PROVENTIL HFA;VENTOLIN HFA) 108 (90 Base) MCG/ACT inhaler Inhale into the lungs.  Marland Kitchen albuterol (PROVENTIL) (2.5 MG/3ML) 0.083% nebulizer solution Take 2.5 mg by nebulization every 6 (six) hours as needed for wheezing or shortness of  breath.  . ALPRAZolam (XANAX) 0.25 MG tablet Take 1 tablet (0.25 mg total) by mouth 3 (three) times daily as needed for anxiety. (Patient not taking: Reported on 02/03/2019)  . amLODipine (NORVASC) 2.5 MG tablet Take 2.5 mg by mouth daily.  Marland Kitchen apixaban (ELIQUIS) 2.5 MG TABS tablet Take 2.5 mg by mouth 2 (two) times daily.  Marland Kitchen atorvastatin (LIPITOR) 40 MG tablet Take 40 mg by mouth daily.  . benzonatate (TESSALON) 100 MG capsule TAKE 2 CAPSULES BY MOUTH THREE TIMES DAILY AS NEEDED FOR COUGH  . furosemide (LASIX) 20 MG tablet Take 20 mg by  mouth.  Marland Kitchen guaiFENesin (MUCINEX) 600 MG 12 hr tablet Take 1 tablet (600 mg total) by mouth 2 (two) times daily.  Marland Kitchen ipratropium (ATROVENT) 0.02 % nebulizer solution Inhale into the lungs.  . meclizine (ANTIVERT) 12.5 MG tablet Take by mouth.   . memantine (NAMENDA) 10 MG tablet   . Multiple Vitamins-Minerals (PRESERVISION AREDS 2+MULTI VIT PO) Take 1 tablet by mouth 2 (two) times daily.  . rivaroxaban (XARELTO) 20 MG TABS tablet Take by mouth.  . senna (SENOKOT) 8.6 MG TABS tablet Take 1 tablet (8.6 mg total) by mouth at bedtime.  . sertraline (ZOLOFT) 25 MG tablet Take 50 mg by mouth at bedtime.   . triamcinolone cream (KENALOG) 0.1 % APPLY CREAM TOPICALLY TWICE DAILY   No facility-administered encounter medications on file as of 04/06/2020.    PHYSICAL EXAM:   General: NAD, frail and chronically ill appearing, well nourished elderly female reclining in bed Cardiovascular: regular rate and rhythm Pulmonary: clear ant fields Abdomen: soft, nontender, + bowel sounds Extremities: no edema, no joint deformities Skin: poor turgor, exposed skin is intact Neurological: A&O to person.  Pleasantly confused. Weakness but otherwise nonfocal Psych:  Calm and cooperative   [name], NP-C

## 2020-04-09 ENCOUNTER — Other Ambulatory Visit: Payer: Self-pay

## 2020-05-18 ENCOUNTER — Other Ambulatory Visit: Payer: Medicare Other | Admitting: Internal Medicine

## 2020-05-18 DIAGNOSIS — F0151 Vascular dementia with behavioral disturbance: Secondary | ICD-10-CM

## 2020-05-18 DIAGNOSIS — F01518 Vascular dementia, unspecified severity, with other behavioral disturbance: Secondary | ICD-10-CM

## 2020-05-18 DIAGNOSIS — Z515 Encounter for palliative care: Secondary | ICD-10-CM

## 2020-05-19 NOTE — Progress Notes (Signed)
Therapist, nutritional Palliative Care Consult Note Telephone: [phone]  Fax: [phone]  PATIENT NAME: [name] DOB: 06/20/1930 MRN: [id number]  PRIMARY CARE PROVIDER:   Tiana Loft, MD  REFERRING PROVIDER:  Dr. Pollie Friar  RESPONSIBLE PARTY:    [name],[name] Son [phone]  POA       RECOMMENDATIONS and PLAN:  Palliative Care Encounter  Z51.5  1.  Advance care planning:   DNAR and MOST form delegating comfort care, antibiotics and IV fluids if indicated and no feeding tube were previously selected.  Goals remain unchanged with focus on quality of life within home setting of caregiver. Re-visit with patient in Jan. 2022.  2.  Vascular dementia with behaviors:  FAST stage 7c with increased sleeping and agitation.  Additional decline.  Continue use of Seroquel and Zoloft.  Hydroxyzine can be used every 6 hours if needed for anxiety.  Continue to monitor for additional cognitive and functional decline. Private caregiver will continue to provide 24/7 supportive services..  3.  Pruitis:  At baseline. Continue Amlactin lotion as needed.  Continue Hydroxyzine as needed per prescription. Improve hydration.   4. 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  Use of air mattress to promote healing and prevent recurrent decubitus development.  Turn and reposition every 2 hours. Keep skin as dry as possible. Barrier skin protection.  Float heels.  Continue to monitor.  5.  Need for influenza and COVID-19 vaccines:  Contacted [facility] and provided contact information to caregiver for scheduling of same vaccine administration at home.  Caregiver agrees with plan with attempts for administration in 1 week.    I spent 60 minutes providing this consultation,  from 1030 to 1130. More than 50% of the time in this consultation was spent coordinating communication with patient, caregiver and local pharmacist. Notified clinical staff at PCP's  office of buttock wound and need for an air mattress.   HISTORY OF PRESENT ILLNESS:  I visited [name] to readdress complex medical decision making related to vascular dementia and pressure injuries of skin. Caregiver [name] states that pt has been sleeping increased number of daytime hours and increased confusion during late afternoon/evening hours.  Noted increased evening sleeping since increase of Seroquel. She also reports reoccurence of a wound of pt's R buttock that she is using Calmoseptine for tx of same.  She requires additional assistance with meals.  Palliative Care was asked to help address goals of care.   CODE STATUS: DNAR/DNI  PPS: 30%  bedridden HOSPICE ELIGIBILITY/DIAGNOSIS: TBD  PAST MEDICAL HISTORY:  Past Medical History:  Diagnosis Date  . COPD (chronic obstructive pulmonary disease) (HCC)   . DVT (deep venous thrombosis) (HCC)   . Hypertension   . Pneumothorax     SOCIAL HX:  Lives with caregiver Social History   Tobacco Use  . Smoking status: Former Games developer  . Smokeless tobacco: Never Used  Substance Use Topics  . Alcohol use: No    ALLERGIES:  Allergies  Allergen Reactions  . Sulfa Antibiotics Other (See Comments)    dizziness dizziness  . Hydralazine Hcl Other (See Comments)    Other reaction(s): Unknown  Other reaction(s): Unknown  . Nsaids     Pt w/ CKD3 Pt w/ CKD3  . Tolmetin     Pt w/ CKD3  . Aspirin Nausea Only and Other (See Comments)    Abdominal pain     PERTINENT MEDICATIONS:  Outpatient Encounter Medications as of 04/06/2020  Medication  Sig  . acetaminophen (TYLENOL) 650 MG CR tablet Take 650 mg by mouth 2 (two) times daily.  Marland Kitchen albuterol (PROVENTIL HFA;VENTOLIN HFA) 108 (90 Base) MCG/ACT inhaler Inhale into the lungs.  Marland Kitchen albuterol (PROVENTIL) (2.5 MG/3ML) 0.083% nebulizer solution Take 2.5 mg by nebulization every 6 (six) hours as needed for wheezing or shortness of breath.  . ALPRAZolam (XANAX) 0.25 MG tablet Take 1 tablet (0.25  mg total) by mouth 3 (three) times daily as needed for anxiety. (Patient not taking: Reported on 02/03/2019)  . amLODipine (NORVASC) 2.5 MG tablet Take 2.5 mg by mouth daily.  Marland Kitchen apixaban (ELIQUIS) 2.5 MG TABS tablet Take 2.5 mg by mouth 2 (two) times daily.  Marland Kitchen atorvastatin (LIPITOR) 40 MG tablet Take 40 mg by mouth daily.  . benzonatate (TESSALON) 100 MG capsule TAKE 2 CAPSULES BY MOUTH THREE TIMES DAILY AS NEEDED FOR COUGH  . furosemide (LASIX) 20 MG tablet Take 20 mg by mouth.  Marland Kitchen guaiFENesin (MUCINEX) 600 MG 12 hr tablet Take 1 tablet (600 mg total) by mouth 2 (two) times daily.  Marland Kitchen ipratropium (ATROVENT) 0.02 % nebulizer solution Inhale into the lungs.  . meclizine (ANTIVERT) 12.5 MG tablet Take by mouth.   . memantine (NAMENDA) 10 MG tablet   . Multiple Vitamins-Minerals (PRESERVISION AREDS 2+MULTI VIT PO) Take 1 tablet by mouth 2 (two) times daily.  . rivaroxaban (XARELTO) 20 MG TABS tablet Take by mouth.  . senna (SENOKOT) 8.6 MG TABS tablet Take 1 tablet (8.6 mg total) by mouth at bedtime.  . sertraline (ZOLOFT) 25 MG tablet Take 50 mg by mouth at bedtime.   . triamcinolone cream (KENALOG) 0.1 % APPLY CREAM TOPICALLY TWICE DAILY   No facility-administered encounter medications on file as of 04/06/2020.    PHYSICAL EXAM:   General: NAD, frail and chronically ill appearing, well nourished elderly female reclining in bed Cardiovascular: regular rate and rhythm Pulmonary: clear ant fields Abdomen: soft, nontender, + bowel sounds Extremities: no edema, stiffness of knees Skin: 1x 50mm stage 2 deep tissue injury of the medial R buttock. No surrounding erythema. Tender to palpation.  Poor skin turgor Neurological: A&O to self.  Pleasantly confused. Weakness but otherwise nonfocal Psych:  Cooperative and mildly agitated mood   [name], NP-C

## 2020-05-19 NOTE — Progress Notes (Signed)
Therapist, nutritional Palliative Care Consult Note Telephone: [phone]  Fax: [phone]  PATIENT NAME: [name] DOB: 1930/05/07 MRN: [id number]  PRIMARY CARE PROVIDER:   Tiana Loft, MD  REFERRING PROVIDER:  Dr. Pollie Friar  RESPONSIBLE PARTY:    Murfin,[name] Son [phone]  POA       RECOMMENDATIONS and PLAN:  Palliative Care Encounter  Z51.5  1.  Advance care planning:   DNAR and MOST form delegating comfort care, antibiotics and IV fluids if indicated and no feeding tube were previously selected.  Goals remain unchanged with focus on quality of life within home setting.    2.  Vascular dementia without behaviors:  FAST stage 7c which is a decline from 12 months ago.  Continue to monitor cognitive and functional status.  Assist with all ADLs as needed by private caregivers.  3.  Pruitis:  May be related to CKD.  Amlactin or Sarna lotions as needed.  Continue Hydroxyzine as needed per prescription. Improve hydration.   4. Alteration in integument: Currently intact.  Consider use of an air mattress to prevent recurrent decubitus development.  Turn and reposition every 2 hours.  Barrier skin protection.  Float heels   I spent 40 minutes providing this consultation,  from 0915 to 0955. More than 50% of the time in this consultation was spent coordinating communication with patient and caregiver.   HISTORY OF PRESENT ILLNESS:  [name] is a 86 y.o. year old female with multiple medical problems including vascular dementia, COPD, T2DM with CKD. Caregiver [name] states that pt has been non ambulatory since May 2020 and is unable to tolerate sitting upright in bed or chair any length of time.  She becomes "clammy and nauseated"  No reports of BP readings during these episodes.  She is still able to feed herself finger foods and hold a beverage container.  Some agitation in the evening hours which is improved with use of Seroquel.   Primary complaint is of pruritis of her torso which is improved with use of Hydroxyzine as needed.  Palliative Care was asked to help address goals of care.   CODE STATUS: DNAR/DNI  PPS: 30% HOSPICE ELIGIBILITY/DIAGNOSIS: TBD  PAST MEDICAL HISTORY:  Past Medical History:  Diagnosis Date  . COPD (chronic obstructive pulmonary disease) (HCC)   . DVT (deep venous thrombosis) (HCC)   . Hypertension   . Pneumothorax     SOCIAL HX:  Lives with caregiver Social History   Tobacco Use  . Smoking status: Former Games developer  . Smokeless tobacco: Never Used  Substance Use Topics  . Alcohol use: No    ALLERGIES:  Allergies  Allergen Reactions  . Sulfa Antibiotics Other (See Comments)    dizziness dizziness  . Hydralazine Hcl Other (See Comments)    Other reaction(s): Unknown  Other reaction(s): Unknown  . Nsaids     Pt w/ CKD3 Pt w/ CKD3  . Tolmetin     Pt w/ CKD3  . Aspirin Nausea Only and Other (See Comments)    Abdominal pain     PERTINENT MEDICATIONS:  Outpatient Encounter Medications as of 04/06/2020  Medication Sig  . acetaminophen (TYLENOL) 650 MG CR tablet Take 650 mg by mouth 2 (two) times daily.  Marland Kitchen albuterol (PROVENTIL HFA;VENTOLIN HFA) 108 (90 Base) MCG/ACT inhaler Inhale into the lungs.  Marland Kitchen albuterol (PROVENTIL) (2.5 MG/3ML) 0.083% nebulizer solution Take 2.5 mg by nebulization every 6 (six) hours as needed for wheezing or shortness of  breath.  . ALPRAZolam (XANAX) 0.25 MG tablet Take 1 tablet (0.25 mg total) by mouth 3 (three) times daily as needed for anxiety. (Patient not taking: Reported on 02/03/2019)  . amLODipine (NORVASC) 2.5 MG tablet Take 2.5 mg by mouth daily.  Marland Kitchen apixaban (ELIQUIS) 2.5 MG TABS tablet Take 2.5 mg by mouth 2 (two) times daily.  Marland Kitchen atorvastatin (LIPITOR) 40 MG tablet Take 40 mg by mouth daily.  . benzonatate (TESSALON) 100 MG capsule TAKE 2 CAPSULES BY MOUTH THREE TIMES DAILY AS NEEDED FOR COUGH  . furosemide (LASIX) 20 MG tablet Take 20 mg by  mouth.  Marland Kitchen guaiFENesin (MUCINEX) 600 MG 12 hr tablet Take 1 tablet (600 mg total) by mouth 2 (two) times daily.  Marland Kitchen ipratropium (ATROVENT) 0.02 % nebulizer solution Inhale into the lungs.  . meclizine (ANTIVERT) 12.5 MG tablet Take by mouth.   . memantine (NAMENDA) 10 MG tablet   . Multiple Vitamins-Minerals (PRESERVISION AREDS 2+MULTI VIT PO) Take 1 tablet by mouth 2 (two) times daily.  . rivaroxaban (XARELTO) 20 MG TABS tablet Take by mouth.  . senna (SENOKOT) 8.6 MG TABS tablet Take 1 tablet (8.6 mg total) by mouth at bedtime.  . sertraline (ZOLOFT) 25 MG tablet Take 50 mg by mouth at bedtime.   . triamcinolone cream (KENALOG) 0.1 % APPLY CREAM TOPICALLY TWICE DAILY   No facility-administered encounter medications on file as of 04/06/2020.    PHYSICAL EXAM:   General: NAD, frail and chronically ill appearing, well nourished elderly female reclining in bed Cardiovascular: regular rate and rhythm Pulmonary: clear ant fields Abdomen: soft, nontender, + bowel sounds Extremities: no edema, stiffness of knees Skin: poor turgor, 1x Neurological: A&O to person.  Pleasantly confused. Weakness but otherwise nonfocal Psych:  Calm and cooperative   [name], NP-C

## 2020-05-20 ENCOUNTER — Encounter: Payer: Self-pay | Admitting: Internal Medicine

## 2020-05-20 NOTE — Patient Instructions (Signed)
Disregard. Duplicate note space.  [name], NP-C

## 2020-05-21 ENCOUNTER — Other Ambulatory Visit: Payer: Self-pay

## 2020-07-12 ENCOUNTER — Other Ambulatory Visit: Payer: Medicare Other | Admitting: Internal Medicine

## 2020-07-12 ENCOUNTER — Other Ambulatory Visit: Payer: Self-pay

## 2020-07-12 DIAGNOSIS — Z7189 Other specified counseling: Secondary | ICD-10-CM

## 2020-07-12 DIAGNOSIS — F01518 Vascular dementia, unspecified severity, with other behavioral disturbance: Secondary | ICD-10-CM

## 2020-07-12 DIAGNOSIS — Z515 Encounter for palliative care: Secondary | ICD-10-CM

## 2020-07-12 DIAGNOSIS — F0151 Vascular dementia with behavioral disturbance: Secondary | ICD-10-CM

## 2020-07-13 NOTE — Progress Notes (Signed)
Therapist, nutritional Palliative Care Consult Note Telephone: [phone]  Fax: [phone]  PATIENT NAME: [name] DOB: [birth year] MRN: [id number]  PRIMARY CARE PROVIDER:   Tiana Loft, MD  REFERRING PROVIDER:  Dr. Pollie Gordon  RESPONSIBLE PARTY:    Lenart,[name] Son [phone]  POA       RECOMMENDATIONS and PLAN:  Palliative Care Encounter  Z51.5  1.  Advance care planning:   DNAR and Gordon form delegating comfort care, antibiotics and IV fluids if indicated and no feeding tube were previously selected and posted at pt's bedside.  Per discussion with son [name], goals are for comfort and to prevent suffering.  He expressed no desire for her to return to the hospital.  He is in agreement with transition to Hospice care as patient is showing signs of additional decline of health. [facility] care will contact son for a home admission.  Triage nurse line telephone number provided if patient has additional decline prior to admission.   2.  Vascular dementia with behaviors:  FAST stage 7d with increased sleeping and refusal of nutritional intake.  She appears to be nearing end of life with decrease of PPS to 10% along with cognitive and functional decline. Conferred with Dr. Malachi Gordon who agreed with potential limited life expectancy of less than 6 months with the natural progression of this disease process.  Conferred with Dr. Darleene Gordon via his medical assistant and received verbal order to transition care to Hospice at [facility]. Hospice referral center who will contact son for additional planning. Private caregiver will continue to provide 24/7 supportive services..  3.  Pruitis:  At baseline. Continue Amlactin lotion as needed.    4.Unstageable pressure injury sacrum: Recurrent.  Treatment plan per hospice nurse.  Air mattress requested  Turn and reposition every 2 hours.  Barrier skin protection.  Float heels.  Continue to monitor.  I spent 60  minutes providing this consultation,  from 1600 to 1700. More than 50% of the time in this consultation was spent coordinating communication with patient, caregiver and son [name](via phone). Telephone notification to PCP's office of pt's state of decline   HISTORY OF PRESENT ILLNESS:  I visited [name] to readdress complex medical decision making related to reports of decline of cognitive and functional abilities.  Caregiver [name] states that pt has been sleeping for the majority of day and night for at least 2-3 weeks. Her nutritional intake has decreased from bits and sips to refusal of any intake.  She also has refused to take medications over past 2 days.  Reports of a reoccurence of a blister at her sacral region. Uses a foam mattress pad for offloading.Calmoseptine for tx of same.  She requires total care for all ADLs and repositioning in bed and is bedridden. Palliative Care was asked to help address goals of care.   CODE STATUS: DNAR/DNI  PPS: 10%  bedridden HOSPICE ELIGIBILITY/DIAGNOSIS: YES/ End stage vascular dementia  PAST MEDICAL HISTORY:  Past Medical History:  Diagnosis Date  . COPD (chronic obstructive pulmonary disease) (HCC)   . DVT (deep venous thrombosis) (HCC)   . Hypertension   . Pneumothorax     SOCIAL HX:  Lives with caregiver Social History   Tobacco Use  . Smoking status: Former Games developer  . Smokeless tobacco: Never Used  Substance Use Topics  . Alcohol use: No    ALLERGIES:  Allergies  Allergen Reactions  . Sulfa Antibiotics Other (See Comments)  dizziness dizziness  . Hydralazine Hcl Other (See Comments)    Other reaction(s): Unknown  Other reaction(s): Unknown  . Nsaids     Pt w/ CKD3 Pt w/ CKD3  . Tolmetin     Pt w/ CKD3  . Aspirin Nausea Only and Other (See Comments)    Abdominal pain     PERTINENT MEDICATIONS:  Outpatient Encounter Medications as of 04/06/2020  Medication Sig  . acetaminophen (TYLENOL) 650 MG CR tablet Take  650 mg by mouth 2 (two) times daily.  Marland Kitchen albuterol (PROVENTIL HFA;VENTOLIN HFA) 108 (90 Base) MCG/ACT inhaler Inhale into the lungs.  Marland Kitchen albuterol (PROVENTIL) (2.5 MG/3ML) 0.083% nebulizer solution Take 2.5 mg by nebulization every 6 (six) hours as needed for wheezing or shortness of breath.  . ALPRAZolam (XANAX) 0.25 MG tablet Take 1 tablet (0.25 mg total) by mouth 3 (three) times daily as needed for anxiety. (Patient not taking: Reported on 02/03/2019)  . amLODipine (NORVASC) 2.5 MG tablet Take 2.5 mg by mouth daily.  Marland Kitchen apixaban (ELIQUIS) 2.5 MG TABS tablet Take 2.5 mg by mouth 2 (two) times daily.  Marland Kitchen atorvastatin (LIPITOR) 40 MG tablet Take 40 mg by mouth daily.  . benzonatate (TESSALON) 100 MG capsule TAKE 2 CAPSULES BY MOUTH THREE TIMES DAILY AS NEEDED FOR COUGH  . furosemide (LASIX) 20 MG tablet Take 20 mg by mouth.  Marland Kitchen guaiFENesin (MUCINEX) 600 MG 12 hr tablet Take 1 tablet (600 mg total) by mouth 2 (two) times daily.  Marland Kitchen ipratropium (ATROVENT) 0.02 % nebulizer solution Inhale into the lungs.  . meclizine (ANTIVERT) 12.5 MG tablet Take by mouth.   . memantine (NAMENDA) 10 MG tablet   . Multiple Vitamins-Minerals (PRESERVISION AREDS 2+MULTI VIT PO) Take 1 tablet by mouth 2 (two) times daily.  . rivaroxaban (XARELTO) 20 MG TABS tablet Take by mouth.  . senna (SENOKOT) 8.6 MG TABS tablet Take 1 tablet (8.6 mg total) by mouth at bedtime.  . sertraline (ZOLOFT) 25 MG tablet Take 50 mg by mouth at bedtime.   . triamcinolone cream (KENALOG) 0.1 % APPLY CREAM TOPICALLY TWICE DAILY   No facility-administered encounter medications on file as of 04/06/2020.    PHYSICAL EXAM:   Vital signs:  BP 156/74  P76  R 24 Sats 89-90% on rm air  General: Very frail and chronically ill appearing,  elderly female reclining in bed EENT: Eyes remained closed entire visit.  Dry mucus membranes Cardiovascular: regular rate and rhythm Pulmonary: clear ant lungs with shallow breathing and intermittent coarse  respirations  Abdomen: soft, nontender, + bowel sounds Extremities: no edema, stiffness of knees Skin: Reports of sacral blister but not visualized. Poor skin turgor Neurological: Somnolent. Aroused very briefly to gentle touch. Weakness. Shook head "no" and said no with attempts to give sips of water and offers of food.    [name], NP-C

## 2020-08-13 ENCOUNTER — Telehealth: Payer: Self-pay

## 2020-08-13 NOTE — Telephone Encounter (Signed)
Spoke with caregiver, [name], who reported that patient has cough and congestion, remains afebrile. PO intake remains poor- eating 1 meal per day on average. Renee requested to schedule a follow up visit with Palliative NP. Scheduled for 08/14/20

## 2020-08-14 ENCOUNTER — Other Ambulatory Visit: Payer: Medicare Other | Admitting: Student

## 2020-08-14 ENCOUNTER — Other Ambulatory Visit: Payer: Self-pay

## 2020-08-14 DIAGNOSIS — Z515 Encounter for palliative care: Secondary | ICD-10-CM

## 2020-08-14 DIAGNOSIS — J189 Pneumonia, unspecified organism: Secondary | ICD-10-CM

## 2020-08-14 DIAGNOSIS — F0150 Vascular dementia without behavioral disturbance: Secondary | ICD-10-CM

## 2020-08-14 NOTE — Progress Notes (Signed)
[facility] Consult Note Telephone: [phone]  Fax: [phone]  PATIENT NAME: [name] (home)  DOB: [name], [name] MRN: [id number]  PRIMARY CARE PROVIDER:    [facility] [address] [place], [place] [address]  REFERRING PROVIDER:   Gale Journey, MD [place],  [place] [address] [phone]  RESPONSIBLE PARTY:   Extended Emergency Contact Information Primary Emergency Contact: Winkels,[name] Address: [place], [place] [id number] Home Phone: [phone] Work Phone: [phone] Relation: Son Secondary Emergency Contact: Tootle,Clifford  [place] Phone: [phone] Relation: Son  I met face to face with patient and family in home.   ASSESSMENT AND RECOMMENDATIONS:   Advance Care Planning: Patient is seen today for follow up palliative consult due to sx of cough/congestion. Visit consisted of building trust and discussions on Palliative Medicine as specialized medical care for people living with serious illness, aimed at facilitating better quality of life through symptoms relief, assisting with advance care plan and establishing goals of care. Patient has MOST form in place; advanced directives reviewed. Discussed assessing for improvement in symptoms; if no improvement with interventions/further decline, will have patient reassessed for Hospice eligibility. Family expressed appreciation for education provided on Palliative care and how that differs from Hospice service. Palliative care will continue to provide support to patient, family and the medical team.  Goal of care: For patient to be managed in the home, prevent hospitalizations.   Directives: DNR, MOST form-no CPR, comfort measures, antibiotics and IV fluids as indicated, no feeding tube.  Symptom Management:   CAP-patient with symptoms consistent with  Community Acquired Pneumonia.  #Start Azithromycin as directed. #Albuterol/ipratropium nebulizer every 6 hours routinely x 5 days. #Mucinex 1200mg  BID x 5 days. #Request Oxygen continuously at 2 lpm via nebulizer to maintain sats 92% or greater. #Deep breathing exercises encouraged every 2 hours as patient is bed bound.   Shortness of breath-patient with hx of COPD; previously on continuous oxygen. Sats 85-89% today on room air. #Continue albuterol/ ipratropium nebulizer every 6 hours routinely x 5 days, then PRN. # #Request Oxygen continuously at 2 lpm via nebulizer to maintain sats 92% or greater. Head of bed elevated.   Cough-patient with non-productive cough, symptoms consistent with CAP. #Mucinex 1200mg  BID x 5 days. #May use tessalon perles 100mg  TID prn cough. Fluids encouraged.   Fever-Acetaminophen 650mg  every 6 hours PRN fever.  Vascular Dementia- patient with vascular dementia; bed bound. She is dependent for all adl's. Family to continue to assist with all adl needs; redirect/reorient as needed.   Discussed with caregiver [name] that Covid-19 cannot be completely ruled out as patient has not been tested. She and family members are encouraged to take precautions and quarantine.   Follow up Palliative Care Visit: Palliative care will continue to follow for goals of care clarification and symptom management. Return 2 weeks or prn. Caregiver is to notify Palliative Medicine if no improvement in symptoms.   Family /Caregiver/Community Supports: Palliative Medicine will continue to provide emotional support to both patient and family.    CHIEF COMPLAINT: "Not feeling well."  History obtained from review of EMR, discussion with patient, primary team and  interview with caregiver. Records reviewed and summarized below.  HISTORY OF PRESENT ILLNESS:  [name] is a 87 y.o. year old female with multiple medical problems including Vascular dementia, hx of CVA,  COPD, stage 3 CKD, T2DM-diet  controlled, recurrent DVT LLE, lymphedema,  hypertension, anemia, vitamin D deficiency, depression, hyperlipidemia  arthritis, GERD, obesity, hx of left hip fracture 2020, hx of colon cancer, diverticulitis, recurrent UTI's. Patient presents with cough and congestion x 4 days. She resides with caregiver [name] and family; several members of household have had cold symptoms over past week. Family members were tested for Covid post 2 days, start of symptoms but test results were negative. Caregivers husband is currently being treated for CAP. Patient reports just not feeling well. Patient with shortness of breath at rest; head of bed must be elevated. Had previously been on continuous oxygen, but was not using so it was removed from the home. She does receive albuterol/ipatropium nebulizer prn shortness of breath/wheezing. Patient's cough has been mostly non-productive. [name] is unsure of fever but states patient has felt warm at times. Patient denies chest pain or palpitations. No headache, nausea, diarrhea. Renee states patient has been taking fluids, appetite is fair, eating two meals a day. Patient is dependent for all adl's; she has been bed bound for almost two years, since hip fracture. Air mattress on bed due to recurrent wound. No recent falls or injury. She is incontinent of bowel and bladder  Palliative Care was asked to follow this patient by consultation request of PCP to help address advance care planning, symptom management and goals of care. This is a follow up visit.  CODE STATUS: DNR  PPS: 20%  HOSPICE ELIGIBILITY/DIAGNOSIS: TBD  PHYSICAL EXAM / ROS:  VS: 98.0 axillary, pulse -96, respirations-22, blood pressure 120/60 sats 85-89% on room air Current weight: patient is bed bound; unable to weight General: NAD, ill appearing Cardiovascular: RRR, no edema  Pulmonary: Scattered rhonchi upper lobes, right base diminished. Bronchophony, egophony +, non-productive cough observed GI: Abdomen  soft, non-tender, Bowel sounds normoactive x 4 GU: denies dysuria MSK:  Left foot drop, non- ambulatory Skin: superficial laceration to anterior LLE Neurological: Weakness, but otherwise nonfocal Psych: Alert & oriented to person, familiars, non -anxious affect  PAST MEDICAL HISTORY:  Past Medical History:  Diagnosis Date  . COPD (chronic obstructive pulmonary disease) (HCC)   . DVT (deep venous thrombosis) (HCC)   . Hypertension   . Pneumothorax     SOCIAL HX:  Social History   Tobacco Use  . Smoking status: Former Games developer  . Smokeless tobacco: Never Used  Substance Use Topics  . Alcohol use: No   FAMILY HX:  Family History  Problem Relation Age of Onset  . Diabetes Maternal Grandmother     ALLERGIES:  Allergies  Allergen Reactions  . Sulfa Antibiotics Other (See Comments)    dizziness dizziness  . Hydralazine Hcl Other (See Comments)    Other reaction(s): Unknown  Other reaction(s): Unknown  . Nsaids     Pt w/ CKD3 Pt w/ CKD3  . Tolmetin     Pt w/ CKD3  . Aspirin Nausea Only and Other (See Comments)    Abdominal pain     PERTINENT MEDICATIONS:  Outpatient Encounter Medications as of 08/14/2020  Medication Sig  . acetaminophen (TYLENOL) 650 MG CR tablet Take 650 mg by mouth 2 (two) times daily.  Marland Kitchen albuterol (PROVENTIL HFA;VENTOLIN HFA) 108 (90 Base) MCG/ACT inhaler Inhale into the lungs.  Marland Kitchen albuterol (PROVENTIL) (2.5 MG/3ML) 0.083% nebulizer solution Take 2.5 mg by nebulization every 6 (six) hours as needed for wheezing or shortness of breath.  . ALPRAZolam (XANAX) 0.25 MG tablet Take 1 tablet (0.25 mg  total) by mouth 3 (three) times daily as needed for anxiety. (Patient not taking: Reported on 02/03/2019)  . amLODipine (NORVASC) 2.5 MG tablet Take 2.5 mg by mouth daily.  Marland Kitchen amLODipine (NORVASC) 2.5 MG tablet Take 2.5 mg by mouth daily.  Marland Kitchen apixaban (ELIQUIS) 2.5 MG TABS tablet Take 2.5 mg by mouth 2 (two) times daily.  Marland Kitchen atorvastatin (LIPITOR) 10 MG tablet Take  40 mg by mouth.   Marland Kitchen atorvastatin (LIPITOR) 40 MG tablet Take 40 mg by mouth daily.  . benzonatate (TESSALON) 100 MG capsule TAKE 2 CAPSULES BY MOUTH THREE TIMES DAILY AS NEEDED FOR COUGH  . furosemide (LASIX) 20 MG tablet Take 20 mg by mouth 2 (two) times daily.   . furosemide (LASIX) 20 MG tablet Take 20 mg by mouth.  Marland Kitchen guaiFENesin (MUCINEX) 600 MG 12 hr tablet Take 1 tablet (600 mg total) by mouth 2 (two) times daily.  Marland Kitchen ipratropium (ATROVENT) 0.02 % nebulizer solution Inhale into the lungs.  . meclizine (ANTIVERT) 12.5 MG tablet Take by mouth.   . memantine (NAMENDA) 10 MG tablet   . Multiple Vitamins-Minerals (PRESERVISION AREDS 2+MULTI VIT PO) Take 1 tablet by mouth 2 (two) times daily.  . rivaroxaban (XARELTO) 20 MG TABS tablet Take by mouth.  . senna (SENOKOT) 8.6 MG TABS tablet Take 1 tablet (8.6 mg total) by mouth at bedtime.  . sertraline (ZOLOFT) 25 MG tablet Take 50 mg by mouth at bedtime.   . triamcinolone cream (KENALOG) 0.1 % APPLY CREAM TOPICALLY TWICE DAILY   No facility-administered encounter medications on file as of 08/14/2020.     Thank you for the opportunity to participate in the care of Luke Rigsbee. The palliative care team will continue to follow. Please call our office at [phone] if we can be of additional assistance.  Ezekiel Slocumb, NP , DNP, AGPCNP-BC

## 2020-11-16 ENCOUNTER — Other Ambulatory Visit: Payer: Self-pay

## 2020-11-16 ENCOUNTER — Other Ambulatory Visit: Payer: Medicare Other | Admitting: Student

## 2020-11-30 ENCOUNTER — Other Ambulatory Visit: Payer: Medicare Other | Admitting: Student

## 2020-11-30 ENCOUNTER — Other Ambulatory Visit: Payer: Self-pay

## 2020-11-30 DIAGNOSIS — R630 Anorexia: Secondary | ICD-10-CM

## 2020-11-30 DIAGNOSIS — R52 Pain, unspecified: Secondary | ICD-10-CM

## 2020-11-30 DIAGNOSIS — F0150 Vascular dementia without behavioral disturbance: Secondary | ICD-10-CM

## 2020-11-30 DIAGNOSIS — Z515 Encounter for palliative care: Secondary | ICD-10-CM

## 2020-11-30 NOTE — Progress Notes (Signed)
Therapist, [facility] Palliative [facility] Consult Note Telephone: [phone]  Fax: [phone]    Date of encounter: 11/30/20 PATIENT NAME: [name] [address] [place] [address]   [phone] (home)  DOB: [birth year] MRN: [id number] PRIMARY CARE PROVIDER:    Dr. Darleene Cleaver  REFERRING PROVIDER:   Dr. Darleene Cleaver  RESPONSIBLE PARTY:    Contact Information    Name Relation Home Work Mobile   Kingsley,[name] Son [phone]    Starkey,Clifford Shari Heritage [phone]         I met face to face with patient and caregiver [name] in the home. Palliative Care was asked to follow this patient by consultation request of Dr. Darleene Cleaver to address advance care planning and complex medical decision making. This is a follow up visit.                                   ASSESSMENT AND PLAN / RECOMMENDATIONS:   Advance Care Planning/Goals of Care: Goals include to maximize quality of life and symptom management. Our advance care planning conversation included a discussion about:     The value and importance of advance care planning   Experiences with loved ones who have been seriously ill or have died   Exploration of personal, cultural or spiritual beliefs that might influence medical decisions   MOST form reviewed.   CODE STATUS: DNR   Symptom Management/Plan:  Vascular Dementia- patient with vascular dementia; bed bound. She is dependent for all adl's. Family/caregiver to continue to assist with all adl needs; redirect/reorient as needed. Monitor for falls/safety. Aspiration precautions.   Appetite-patient with poor to fair appetite. Continue assist with eating, offer foods patient enjoys. Recommend nutritional supplement BID.  Pain-patient with leg pain. Recommend increasing acetaminophen to 650mg  TID. Discussed stopping the atorvastatin as patient is having more difficulty swallowing; ? If this is contributing to her leg pain.   Follow up Palliative Care  Visit: Palliative care will continue to follow for complex medical decision making, advance care planning, and clarification of goals. Return in 8 weeks or prn.   This visit was coded based on medical decision making (MDM).  PPS: 30%  HOSPICE ELIGIBILITY/DIAGNOSIS: TBD  Chief Complaint: Palliative Medicine visit; dementia.  HISTORY OF PRESENT ILLNESS:  [name] is a 87 y.o. year old female  with vascular dementia.   Patient is bed bound, dependent for all adl's. Lives with caregivers 24/7. Sleeping more during the day. Incontinent of bowel and bladder. Caregiver Renee asks if foley catheter can be inserted. Patient complains of leg pain. No recent falls. No recent infections. No recent ER visits or hospitalizations.   History obtained from review of EMR, discussion with primary team, and interview with family, facility staff/caregiver and/or [name].  I reviewed available labs, medications, imaging, studies and related documents from the EMR.  Records reviewed and summarized above.   ROS  Unable to obtain due to impaired cognition.   Physical Exam: Constitutional: NAD; received resting in bed General: frail appearing EYES: anicteric sclera, lids intact, no discharge  ENMT: intact hearing, oral mucous membranes moist, dentition intact CV: S1S2, RRR, no LE edema Pulmonary: LCTA, no increased work of breathing, no cough, room air Abdomen:  normo-active BS + 4 quadrants, soft and non tender GU: deferred MSK: non-ambulatory Skin: warm and dry, no rashes. Skin tears to left leg; dressings in place, CDI. Neuro: generalized weakness,  A & O to person Psych: non-anxious affect Hem/lymph/immuno: no widespread bruising   Thank you for the opportunity to participate in the care of [name].  The palliative care team will continue to follow. Please call our office at [phone] if we can be of additional assistance.   Ezekiel Slocumb, NP   COVID-19 PATIENT SCREENING TOOL Asked  and negative response unless otherwise noted:   Have you had symptoms of covid, tested positive or been in contact with someone with symptoms/positive test in the past 5-10 days? No

## 2020-12-17 ENCOUNTER — Telehealth: Payer: Self-pay

## 2020-12-17 NOTE — Telephone Encounter (Signed)
Spoke with patient's caregiver who shared that patient has a non productive cough and has been febrile. Patient appears as she does not feel well. Sleeping more and caregiver is having to wake patient up to assist her with food and fluids. PCP was made aware and ordered a chest X ray to be done today at the home.

## 2021-03-07 DEATH — deceased
